# Patient Record
Sex: Female | Born: 1950 | Race: White | Hispanic: No
Health system: Southern US, Community
[De-identification: ages and names within clinical notes are randomized; demographics above are authoritative.]

## PROBLEM LIST (undated history)

## (undated) DIAGNOSIS — J45909 Unspecified asthma, uncomplicated: Secondary | ICD-10-CM

## (undated) DIAGNOSIS — M1990 Unspecified osteoarthritis, unspecified site: Secondary | ICD-10-CM

## (undated) DIAGNOSIS — N289 Disorder of kidney and ureter, unspecified: Secondary | ICD-10-CM

## (undated) DIAGNOSIS — F1190 Opioid use, unspecified, uncomplicated: Secondary | ICD-10-CM

## (undated) DIAGNOSIS — E669 Obesity, unspecified: Secondary | ICD-10-CM

## (undated) DIAGNOSIS — J449 Chronic obstructive pulmonary disease, unspecified: Secondary | ICD-10-CM

## (undated) DIAGNOSIS — I509 Heart failure, unspecified: Secondary | ICD-10-CM

## (undated) DIAGNOSIS — Z930 Tracheostomy status: Secondary | ICD-10-CM

## (undated) HISTORY — PX: OTHER SURGICAL HISTORY: SHX169

## (undated) HISTORY — PX: TRACHELECTOMY: SHX6586

## (undated) HISTORY — DX: Opioid use, unspecified, uncomplicated: F11.90

## (undated) HISTORY — DX: Obesity, unspecified: E66.9

## (undated) HISTORY — DX: Tracheostomy status: Z93.0

---

## 2018-04-30 ENCOUNTER — Telehealth: Payer: Self-pay | Admitting: Pulmonary Disease

## 2018-04-30 ENCOUNTER — Telehealth: Payer: Self-pay

## 2018-04-30 NOTE — Telephone Encounter (Signed)
Called and spoke with Patient.  Patient has appointment with Dr. Tonia BroomsIcard Monday, 05/03/18, at 0900.  She was calling to see if she had to be seen before she got a vent.  She said that she had used one in the past.  Explained that she would have to be seen,before Dr. Tonia BroomsIcard would prescribe anything.  She stated understanding.  Nothing further.

## 2018-04-30 NOTE — Telephone Encounter (Signed)
Called made to patient to obtain referring MD information as we have no records and patient is not in epic. Patient reports her previous Pulmonologist is Dr. Lonia Chimerayan Nahapetian [phone] with [facility] in [place], [place]. I did encourage patient to bring records and or images she has on hand to help assist with her consult visit. Patient voices understanding. Called and left message with clinic asking for records to be faxed. Nothing further needed at this time.

## 2018-05-03 ENCOUNTER — Encounter: Payer: Self-pay | Admitting: Pulmonary Disease

## 2018-05-03 ENCOUNTER — Ambulatory Visit (INDEPENDENT_AMBULATORY_CARE_PROVIDER_SITE_OTHER): Payer: Medicare Other | Admitting: Pulmonary Disease

## 2018-05-03 VITALS — BP 114/82 | HR 62 | Ht 60.0 in | Wt >= 6400 oz

## 2018-05-03 DIAGNOSIS — Z930 Tracheostomy status: Secondary | ICD-10-CM | POA: Diagnosis not present

## 2018-05-03 DIAGNOSIS — E662 Morbid (severe) obesity with alveolar hypoventilation: Secondary | ICD-10-CM

## 2018-05-03 DIAGNOSIS — J9611 Chronic respiratory failure with hypoxia: Secondary | ICD-10-CM | POA: Diagnosis not present

## 2018-05-03 NOTE — Patient Instructions (Addendum)
Thank you for visiting Dr. Tonia BroomsIcard at [facility]. Today we recommend the following:  Please set up with Sleep Consult.   You have an appointment Thursday December 19th at 2:00pm at [facility] at the [facility]. Stop at admitting and they will take you where you need to go. They will send you a reminder letter. If you need to reschedule call [phone].

## 2018-05-03 NOTE — Progress Notes (Signed)
Synopsis: Referred in November 2019, chronic respiratory failure requiring tracheostomy due to super morbid obesity and obesity hypoventilation syndrome by No ref. provider found  Subjective:   PATIENT ID: Rachael Robertson GENDER: female DOB: 1951/04/05, MRN: [id number]  Chief Complaint  Patient presents with  . Consult    Patient reports she moved here from [place] to be closer to family. Has been on oxygen for 2 years, uses 2L during the day and 5L at night with vent. Patient states she needs a new order for a vent.     PMH chronic respiratory, OHS, requiring trachestomy in 2017, [place], [facility].  Patient has a BMI of 88 and had multiple hospitalizations in [place] due to respiratory failure.  Patient has a history of heart failure and states that she would die several days in the hospital and they would give her medications to make her pee.  Approximately 2 years ago she required hospitalization and ultimately underwent tracheostomy tube placement.  Since then she has had multiple different changes of her tracheostomy.  She is currently in a 6 cuffless [place].  And has been tolerating well with Passy-Muir valve and trach collar for oxygen supplementation.  She was on a ventilator at nighttime in the past.  She used to follow with a sleep physician in [place].  The patient recently moved here to be closer to family.  She lives in a home by herself.  She is currently unable to make her own food but she does have family that is bringing food to her.  Additionally she states that she routinely orders food from [employer] and has it delivered to her house because she is unable to leave her home alone.  She is able to at least dress herself.  She would like to be set up with a new sleep doctor.     Past Medical History:  Diagnosis Date  . Obesity   . Opiate use   . Tracheostomy in place [facility])      Family History  Problem Relation Age of Onset  . Aneurysm Mother    . Heart disease Father   . Skin cancer Father   . Sleep apnea Father        TRACHEOSTOMY AS WELL  . Breast cancer Sister      Past Surgical History:  Procedure Laterality Date  . Perianal abscess    . TRACHELECTOMY      Social History   Socioeconomic History  . Marital status: Single    Spouse name: Not on file  . Number of children: Not on file  . Years of education: Not on file  . Highest education level: Not on file  Occupational History  . Not on file  Social Needs  . Financial resource strain: Not on file  . Food insecurity:    Worry: Not on file    Inability: Not on file  . Transportation needs:    Medical: Not on file    Non-medical: Not on file  Tobacco Use  . Smoking status: Passive Smoke Exposure - Never Smoker  . Smokeless tobacco: Never Used  Substance and Sexual Activity  . Alcohol use: Yes    Comment: 1-2 per year   . Drug use: Not on file  . Sexual activity: Not on file  Lifestyle  . Physical activity:    Days per week: Not on file    Minutes per session: Not on file  . Stress: Not on file  Relationships  . Social connections:    Talks on phone: Not on file    Gets together: Not on file    Attends religious service: Not on file    Active member of club or organization: Not on file    Attends meetings of clubs or organizations: Not on file    Relationship status: Not on file  . Intimate partner violence:    Fear of current or ex partner: Not on file    Emotionally abused: Not on file    Physically abused: Not on file    Forced sexual activity: Not on file  Other Topics Concern  . Not on file  Social History Narrative  . Not on file     Allergies  Allergen Reactions  . Erythromycin     Rash and Hives  . Naproxen     Makes her congested  . Sulfa Antibiotics     Hives     Outpatient Medications Prior to Visit  Medication Sig Dispense Refill  . acetaminophen (TYLENOL) 500 MG tablet Take 500 mg by mouth 2 (two) times daily as  needed.    Marland Kitchen. albuterol (PROVENTIL HFA;VENTOLIN HFA) 108 (90 Base) MCG/ACT inhaler Inhale 1-2 puffs into the lungs every 6 (six) hours as needed for wheezing or shortness of breath.    . allopurinol (ZYLOPRIM) 300 MG tablet Take 300 mg by mouth daily.    . budesonide-formoterol (SYMBICORT) 160-4.5 MCG/ACT inhaler Inhale 2 puffs into the lungs 2 (two) times daily.    . bumetanide (BUMEX) 2 MG tablet Take 4 mg by mouth daily. Takes 4mg  prn in afternoon    . carvedilol (COREG) 3.125 MG tablet Take 3.125 mg by mouth 2 (two) times daily as needed.    Marland Kitchen. levothyroxine (SYNTHROID, LEVOTHROID) 50 MCG tablet Take 50 mcg by mouth daily before breakfast.    . omeprazole (PRILOSEC) 20 MG capsule Take 20 mg by mouth daily.    Marland Kitchen. oxycodone (OXY-IR) 5 MG capsule Take 5 mg by mouth every 4 (four) hours as needed.    . potassium chloride SA (K-DUR,KLOR-CON) 20 MEQ tablet Take 40 mEq by mouth daily. Takes 40meq if prn dose of Bumex is taken.    . pramipexole (MIRAPEX) 1 MG tablet Take 1 mg by mouth 2 (two) times daily.    . pramipexole (MIRAPEX) 1 MG tablet Take 0.5 mg by mouth daily as needed.    . rivaroxaban (XARELTO) 10 MG TABS tablet Take 10 mg by mouth daily. Patient not sure what mg?    . spironolactone (ALDACTONE) 100 MG tablet Take 100 mg by mouth daily.     No facility-administered medications prior to visit.     Review of Systems  Constitutional: Negative for chills, fever, malaise/fatigue and weight loss.  HENT: Negative for hearing loss, sore throat and tinnitus.   Eyes: Negative for blurred vision and double vision.  Respiratory: Positive for shortness of breath. Negative for cough, hemoptysis, sputum production, wheezing and stridor.        Chronic shortness of breath, tracheostomy  Cardiovascular: Negative for chest pain, palpitations, orthopnea, leg swelling and PND.  Gastrointestinal: Negative for abdominal pain, constipation, diarrhea, heartburn, nausea and vomiting.  Genitourinary: Negative  for dysuria, hematuria and urgency.  Musculoskeletal: Negative for joint pain and myalgias.  Skin: Negative for itching and rash.  Neurological: Negative for dizziness, tingling, weakness and headaches.  Endo/Heme/Allergies: Negative for environmental allergies. Does not bruise/bleed easily.  Psychiatric/Behavioral: Negative for depression. The patient is not nervous/anxious  and does not have insomnia.   All other systems reviewed and are negative.    Objective:  Physical Exam  Constitutional: She is oriented to person, place, and time. No distress.  Obese   HENT:  Head: Normocephalic and atraumatic.  Mouth/Throat: Oropharynx is clear and moist.  Tracheostomy in place.  Tracheostomy site inspected, clean dry and intact.  Well-formed stoma  Eyes: Pupils are equal, round, and reactive to light. Conjunctivae are normal. No scleral icterus.  Neck: Neck supple. No JVD present. No tracheal deviation present.  Cardiovascular: Normal rate, regular rhythm, normal heart sounds and intact distal pulses.  No murmur heard. She does have a heart tones however it is very difficult to auscultate the chest due to her super morbid obesity.  Pulmonary/Chest: Breath sounds normal. No accessory muscle usage or stridor. No tachypnea. She has no wheezes. She has no rhonchi. She has no rales.  Shallow inspiratory effort Overall her pulmonary exam is very difficult due to her super morbid obesity.  Abdominal: Soft. Bowel sounds are normal. She exhibits no distension. There is no tenderness.  Morbidly obese tenderness  Musculoskeletal: She exhibits edema. She exhibits no tenderness.  Significant lymphedema/lipedema of the lower extremities  Lymphadenopathy:    She has no cervical adenopathy.  Neurological: She is alert and oriented to person, place, and time.  Skin: Skin is warm and dry. Capillary refill takes less than 2 seconds. No rash noted.  Psychiatric: She has a normal mood and affect. Her behavior  is normal.  Vitals reviewed.    Vitals:   05/03/18 0948 05/03/18 0949  BP:  114/82  Pulse:  62  SpO2:  100%  Weight: (!) 452 lb (205 kg) (!) 452 lb (205 kg)  Height: 5' (1.524 m) 5' (1.524 m)   100% on 4 L/min trach collar BMI Readings from Last 3 Encounters:  05/03/18 88.28 kg/m   Wt Readings from Last 3 Encounters:  05/03/18 (!) 452 lb (205 kg)    CBC No results found for: WBC, RBC, HGB, HCT, PLT, MCV, MCH, MCHC, RDW, LYMPHSABS, MONOABS, EOSABS, BASOSABS  Chest Imaging: No prior chest imaging for review  Pulmonary Functions Testing Results:None   FeNO: None   Pathology: None   Echocardiogram: None   Heart Catheterization: None     Assessment & Plan:   Chronic respiratory failure with hypoxia (HCC)  Tracheostomy dependent (HCC)  Obesity hypoventilation syndrome (HCC)  Obesity, morbid, BMI 50 or higher (HCC)  Discussion:  67 year old morbidly obese female status post tracheostomy likely secondary to obesity hypoventilation syndrome and BMI of 88.  She is currently wheelchair-bound most of the time.  I am worried that she is unable to care for herself adequately at home.  She does state that she has family that comes to check on her regularly as well as a caregiver that comes in every morning.  And currently she obtains food by ordering it from a delivery service.  She is here today to establish care in the [place] area as she recently moved from [place].  I will get her set up in our sleep clinic.  She would like to go back on a home ventilator at night if possible.  I will also get appointment to establish care with Anders Simmonds, NP in our tracheostomy care clinic.  I also had some discussions with her about exchanging her trach for a cuffed trach if she plan to go on a ventilator.  She did state that she had had several of  these in the past all of which had failed and she did not like having a cuffed trach in.  At this point, I counseled the patient on  weight loss.  This is an obvious large comorbidity in her life at this time.  She has been seen by bariatric surgery in the past which have all stated that he was too large to have surgery.  She can return to see me as needed however I suspect she needs close follow-up in the sleep clinic.  Greater than 50% of this patient's 45-minute visit was spent face-to-face discussing the above recommendations and treatment plan.   Current Outpatient Medications:  .  acetaminophen (TYLENOL) 500 MG tablet, Take 500 mg by mouth 2 (two) times daily as needed., Disp: , Rfl:  .  albuterol (PROVENTIL HFA;VENTOLIN HFA) 108 (90 Base) MCG/ACT inhaler, Inhale 1-2 puffs into the lungs every 6 (six) hours as needed for wheezing or shortness of breath., Disp: , Rfl:  .  allopurinol (ZYLOPRIM) 300 MG tablet, Take 300 mg by mouth daily., Disp: , Rfl:  .  budesonide-formoterol (SYMBICORT) 160-4.5 MCG/ACT inhaler, Inhale 2 puffs into the lungs 2 (two) times daily., Disp: , Rfl:  .  bumetanide (BUMEX) 2 MG tablet, Take 4 mg by mouth daily. Takes 4mg  prn in afternoon, Disp: , Rfl:  .  carvedilol (COREG) 3.125 MG tablet, Take 3.125 mg by mouth 2 (two) times daily as needed., Disp: , Rfl:  .  levothyroxine (SYNTHROID, LEVOTHROID) 50 MCG tablet, Take 50 mcg by mouth daily before breakfast., Disp: , Rfl:  .  omeprazole (PRILOSEC) 20 MG capsule, Take 20 mg by mouth daily., Disp: , Rfl:  .  oxycodone (OXY-IR) 5 MG capsule, Take 5 mg by mouth every 4 (four) hours as needed., Disp: , Rfl:  .  potassium chloride SA (K-DUR,KLOR-CON) 20 MEQ tablet, Take 40 mEq by mouth daily. Takes if prn dose of Bumex is taken., Disp: , Rfl:  .  pramipexole (MIRAPEX) 1 MG tablet, Take 1 mg by mouth 2 (two) times daily., Disp: , Rfl:  .  pramipexole (MIRAPEX) 1 MG tablet, Take 0.5 mg by mouth daily as needed., Disp: , Rfl:  .  rivaroxaban (XARELTO) 10 MG TABS tablet, Take 10 mg by mouth daily. Patient not sure what mg?, Disp: , Rfl:  .   spironolactone (ALDACTONE) 100 MG tablet, Take 100 mg by mouth daily., Disp: , Rfl:    Josephine Igo, DO [facility] 05/03/2018 3:38 PM

## 2018-05-04 ENCOUNTER — Telehealth: Payer: Self-pay | Admitting: Pulmonary Disease

## 2018-05-04 NOTE — Telephone Encounter (Signed)
Spoke with patient. She stated that she was seen by Dr. Tonia BroomsIcard on 05/03/18. She was advised to call back to give Dr. Tonia BroomsIcard the name of the pharmacy where the RX for oxycodone could be sent to. [facility] [address]. Advised patient that normally these RXs are printed out but I would send a message over to Dr. Tonia BroomsIcard for her.  Dr. Tonia BroomsIcard, please advise if you remember having a conversation with her about sending in a controlled substance. Thanks!

## 2018-05-04 NOTE — Telephone Encounter (Signed)
PCCM:  I will not be prescribing any opiates for chronic pain.   Josephine IgoBradley L Dmario Russom, DO [facility] 05/04/2018 5:05 PM

## 2018-05-05 NOTE — Telephone Encounter (Signed)
Called and spoke with patient, made aware BI would not be prescribing any opiates for chronic pain. Patient voices understanding and would like to see if BI would place order for referral to PCP.   Verbal order given per BI, okay to place order for referral to PCP. Order placed. Nothing further needed at this time.

## 2018-05-05 NOTE — Telephone Encounter (Signed)
Called patient unable to reach left message to give us a call back.

## 2018-05-05 NOTE — Telephone Encounter (Signed)
Patient returned phone call; pt contact # [phone]

## 2018-05-07 ENCOUNTER — Inpatient Hospital Stay (HOSPITAL_COMMUNITY)
Admission: EM | Admit: 2018-05-07 | Discharge: 2018-05-17 | DRG: 205 | Disposition: A | Discharge status: Home Health | Payer: Medicare Other | Attending: Internal Medicine | Admitting: Internal Medicine

## 2018-05-07 ENCOUNTER — Inpatient Hospital Stay (HOSPITAL_COMMUNITY): Payer: Medicare Other

## 2018-05-07 ENCOUNTER — Other Ambulatory Visit: Payer: Self-pay

## 2018-05-07 ENCOUNTER — Emergency Department (HOSPITAL_COMMUNITY): Payer: Medicare Other

## 2018-05-07 ENCOUNTER — Encounter (HOSPITAL_COMMUNITY): Payer: Self-pay | Admitting: Emergency Medicine

## 2018-05-07 DIAGNOSIS — Z930 Tracheostomy status: Secondary | ICD-10-CM | POA: Diagnosis not present

## 2018-05-07 DIAGNOSIS — Z79890 Hormone replacement therapy (postmenopausal): Secondary | ICD-10-CM | POA: Diagnosis not present

## 2018-05-07 DIAGNOSIS — N179 Acute kidney failure, unspecified: Secondary | ICD-10-CM | POA: Diagnosis not present

## 2018-05-07 DIAGNOSIS — I5042 Chronic combined systolic (congestive) and diastolic (congestive) heart failure: Secondary | ICD-10-CM | POA: Diagnosis present

## 2018-05-07 DIAGNOSIS — R0789 Other chest pain: Secondary | ICD-10-CM

## 2018-05-07 DIAGNOSIS — G4733 Obstructive sleep apnea (adult) (pediatric): Secondary | ICD-10-CM

## 2018-05-07 DIAGNOSIS — Y838 Other surgical procedures as the cause of abnormal reaction of the patient, or of later complication, without mention of misadventure at the time of the procedure: Secondary | ICD-10-CM | POA: Diagnosis present

## 2018-05-07 DIAGNOSIS — N189 Chronic kidney disease, unspecified: Secondary | ICD-10-CM | POA: Diagnosis not present

## 2018-05-07 DIAGNOSIS — Z886 Allergy status to analgesic agent status: Secondary | ICD-10-CM

## 2018-05-07 DIAGNOSIS — J9509 Other tracheostomy complication: Principal | ICD-10-CM | POA: Diagnosis present

## 2018-05-07 DIAGNOSIS — E662 Morbid (severe) obesity with alveolar hypoventilation: Secondary | ICD-10-CM | POA: Diagnosis present

## 2018-05-07 DIAGNOSIS — I509 Heart failure, unspecified: Secondary | ICD-10-CM | POA: Diagnosis not present

## 2018-05-07 DIAGNOSIS — Z881 Allergy status to other antibiotic agents status: Secondary | ICD-10-CM | POA: Diagnosis not present

## 2018-05-07 DIAGNOSIS — R001 Bradycardia, unspecified: Secondary | ICD-10-CM | POA: Diagnosis not present

## 2018-05-07 DIAGNOSIS — T502X5A Adverse effect of carbonic-anhydrase inhibitors, benzothiadiazides and other diuretics, initial encounter: Secondary | ICD-10-CM | POA: Diagnosis not present

## 2018-05-07 DIAGNOSIS — J9611 Chronic respiratory failure with hypoxia: Secondary | ICD-10-CM | POA: Diagnosis present

## 2018-05-07 DIAGNOSIS — E875 Hyperkalemia: Secondary | ICD-10-CM | POA: Diagnosis present

## 2018-05-07 DIAGNOSIS — I878 Other specified disorders of veins: Secondary | ICD-10-CM | POA: Diagnosis not present

## 2018-05-07 DIAGNOSIS — E039 Hypothyroidism, unspecified: Secondary | ICD-10-CM | POA: Diagnosis present

## 2018-05-07 DIAGNOSIS — Z8249 Family history of ischemic heart disease and other diseases of the circulatory system: Secondary | ICD-10-CM | POA: Diagnosis not present

## 2018-05-07 DIAGNOSIS — T45515A Adverse effect of anticoagulants, initial encounter: Secondary | ICD-10-CM | POA: Diagnosis not present

## 2018-05-07 DIAGNOSIS — J690 Pneumonitis due to inhalation of food and vomit: Secondary | ICD-10-CM | POA: Diagnosis present

## 2018-05-07 DIAGNOSIS — J9612 Chronic respiratory failure with hypercapnia: Secondary | ICD-10-CM | POA: Diagnosis present

## 2018-05-07 DIAGNOSIS — Z9981 Dependence on supplemental oxygen: Secondary | ICD-10-CM | POA: Diagnosis not present

## 2018-05-07 DIAGNOSIS — R042 Hemoptysis: Secondary | ICD-10-CM | POA: Diagnosis present

## 2018-05-07 DIAGNOSIS — R0902 Hypoxemia: Secondary | ICD-10-CM

## 2018-05-07 DIAGNOSIS — N183 Chronic kidney disease, stage 3 (moderate): Secondary | ICD-10-CM | POA: Diagnosis present

## 2018-05-07 DIAGNOSIS — Z79899 Other long term (current) drug therapy: Secondary | ICD-10-CM

## 2018-05-07 DIAGNOSIS — Z882 Allergy status to sulfonamides status: Secondary | ICD-10-CM

## 2018-05-07 DIAGNOSIS — I5020 Unspecified systolic (congestive) heart failure: Secondary | ICD-10-CM | POA: Diagnosis not present

## 2018-05-07 DIAGNOSIS — J449 Chronic obstructive pulmonary disease, unspecified: Secondary | ICD-10-CM | POA: Diagnosis present

## 2018-05-07 DIAGNOSIS — Z6841 Body Mass Index (BMI) 40.0 and over, adult: Secondary | ICD-10-CM

## 2018-05-07 DIAGNOSIS — R5383 Other fatigue: Secondary | ICD-10-CM | POA: Diagnosis not present

## 2018-05-07 DIAGNOSIS — Z7722 Contact with and (suspected) exposure to environmental tobacco smoke (acute) (chronic): Secondary | ICD-10-CM | POA: Diagnosis present

## 2018-05-07 DIAGNOSIS — Z9911 Dependence on respirator [ventilator] status: Secondary | ICD-10-CM | POA: Diagnosis not present

## 2018-05-07 DIAGNOSIS — J181 Lobar pneumonia, unspecified organism: Secondary | ICD-10-CM

## 2018-05-07 DIAGNOSIS — J189 Pneumonia, unspecified organism: Secondary | ICD-10-CM

## 2018-05-07 DIAGNOSIS — I130 Hypertensive heart and chronic kidney disease with heart failure and stage 1 through stage 4 chronic kidney disease, or unspecified chronic kidney disease: Secondary | ICD-10-CM | POA: Diagnosis not present

## 2018-05-07 DIAGNOSIS — Z86711 Personal history of pulmonary embolism: Secondary | ICD-10-CM

## 2018-05-07 DIAGNOSIS — D6832 Hemorrhagic disorder due to extrinsic circulating anticoagulants: Secondary | ICD-10-CM | POA: Diagnosis not present

## 2018-05-07 DIAGNOSIS — Z7901 Long term (current) use of anticoagulants: Secondary | ICD-10-CM | POA: Diagnosis not present

## 2018-05-07 DIAGNOSIS — R233 Spontaneous ecchymoses: Secondary | ICD-10-CM | POA: Diagnosis not present

## 2018-05-07 LAB — RESPIRATORY PANEL BY PCR
ADENOVIRUS-RVPPCR: NOT DETECTED
Bordetella pertussis: NOT DETECTED
CORONAVIRUS 229E-RVPPCR: NOT DETECTED
CORONAVIRUS NL63-RVPPCR: NOT DETECTED
Chlamydophila pneumoniae: NOT DETECTED
Coronavirus HKU1: NOT DETECTED
Coronavirus OC43: NOT DETECTED
Influenza A: NOT DETECTED
Influenza B: NOT DETECTED
Metapneumovirus: NOT DETECTED
Mycoplasma pneumoniae: NOT DETECTED
Parainfluenza Virus 1: NOT DETECTED
Parainfluenza Virus 2: NOT DETECTED
Parainfluenza Virus 3: NOT DETECTED
Parainfluenza Virus 4: NOT DETECTED
Respiratory Syncytial Virus: NOT DETECTED
Rhinovirus / Enterovirus: NOT DETECTED

## 2018-05-07 LAB — SAMPLE TO BLOOD BANK

## 2018-05-07 LAB — CBC WITH DIFFERENTIAL/PLATELET
ABS IMMATURE GRANULOCYTES: 0.13 10*3/uL — AB (ref 0.00–0.07)
BASOS PCT: 0 %
Basophils Absolute: 0 10*3/uL (ref 0.0–0.1)
Eosinophils Absolute: 0.2 10*3/uL (ref 0.0–0.5)
Eosinophils Relative: 3 %
HCT: 32.7 % — ABNORMAL LOW (ref 36.0–46.0)
Hemoglobin: 9.1 g/dL — ABNORMAL LOW (ref 12.0–15.0)
Immature Granulocytes: 2 %
Lymphocytes Relative: 10 %
Lymphs Abs: 0.6 10*3/uL — ABNORMAL LOW (ref 0.7–4.0)
MCH: 26.3 pg (ref 26.0–34.0)
MCHC: 27.8 g/dL — ABNORMAL LOW (ref 30.0–36.0)
MCV: 94.5 fL (ref 80.0–100.0)
Monocytes Absolute: 0.4 10*3/uL (ref 0.1–1.0)
Monocytes Relative: 7 %
NRBC: 0.3 % — AB (ref 0.0–0.2)
Neutro Abs: 4.5 10*3/uL (ref 1.7–7.7)
Neutrophils Relative %: 78 %
Platelets: 136 10*3/uL — ABNORMAL LOW (ref 150–400)
RBC: 3.46 MIL/uL — ABNORMAL LOW (ref 3.87–5.11)
RDW: 15 % (ref 11.5–15.5)
WBC: 5.7 10*3/uL (ref 4.0–10.5)

## 2018-05-07 LAB — I-STAT TROPONIN, ED
Troponin i, poc: 0 ng/mL (ref 0.00–0.08)
Troponin i, poc: 0.01 ng/mL (ref 0.00–0.08)

## 2018-05-07 LAB — BASIC METABOLIC PANEL
Anion gap: 6 (ref 5–15)
BUN: 59 mg/dL — ABNORMAL HIGH (ref 8–23)
CO2: 42 mmol/L — ABNORMAL HIGH (ref 22–32)
CREATININE: 1.34 mg/dL — AB (ref 0.44–1.00)
Calcium: 9.3 mg/dL (ref 8.9–10.3)
Chloride: 95 mmol/L — ABNORMAL LOW (ref 98–111)
GFR calc non Af Amer: 41 mL/min — ABNORMAL LOW (ref >60)
GFR, EST AFRICAN AMERICAN: 48 mL/min — AB (ref >60)
Glucose, Bld: 91 mg/dL (ref 70–99)
Potassium: 4.5 mmol/L (ref 3.5–5.1)
Sodium: 143 mmol/L (ref 135–145)

## 2018-05-07 LAB — TSH: TSH: 1.897 u[IU]/mL (ref 0.350–4.500)

## 2018-05-07 LAB — BRAIN NATRIURETIC PEPTIDE: B Natriuretic Peptide: 252.7 pg/mL — ABNORMAL HIGH (ref 0.0–100.0)

## 2018-05-07 LAB — PROTIME-INR
INR: 1.04
Prothrombin Time: 13.5 s (ref 11.4–15.2)

## 2018-05-07 LAB — ECHOCARDIOGRAM COMPLETE
Height: 60 in
Weight: 7232.01 oz

## 2018-05-07 MED ORDER — ACETAMINOPHEN 325 MG PO TABS
650.0000 mg | ORAL_TABLET | Freq: Four times a day (QID) | ORAL | Status: DC | PRN
  Administered 2018-05-09 – 2018-05-16 (×15): 650 mg via ORAL
  Filled 2018-05-07 (×16): qty 2

## 2018-05-07 MED ORDER — IPRATROPIUM-ALBUTEROL 0.5-2.5 (3) MG/3ML IN SOLN
3.0000 mL | Freq: Four times a day (QID) | RESPIRATORY_TRACT | Status: DC | PRN
  Administered 2018-05-07 – 2018-05-13 (×3): 3 mL via RESPIRATORY_TRACT
  Filled 2018-05-07 (×4): qty 3

## 2018-05-07 MED ORDER — METHYLPREDNISOLONE SODIUM SUCC 125 MG IJ SOLR
125.0000 mg | Freq: Once | INTRAMUSCULAR | Status: AC
  Administered 2018-05-07: 125 mg via INTRAVENOUS
  Filled 2018-05-07: qty 2

## 2018-05-07 MED ORDER — ALLOPURINOL 300 MG PO TABS
300.0000 mg | ORAL_TABLET | Freq: Every day | ORAL | Status: DC
  Administered 2018-05-07 – 2018-05-17 (×11): 300 mg via ORAL
  Filled 2018-05-07 (×11): qty 1

## 2018-05-07 MED ORDER — BUMETANIDE 2 MG PO TABS
2.0000 mg | ORAL_TABLET | Freq: Every day | ORAL | Status: DC
  Administered 2018-05-08 – 2018-05-12 (×5): 2 mg via ORAL
  Filled 2018-05-07 (×6): qty 1

## 2018-05-07 MED ORDER — RIVAROXABAN 20 MG PO TABS
20.0000 mg | ORAL_TABLET | Freq: Every day | ORAL | Status: DC
  Administered 2018-05-07 – 2018-05-16 (×9): 20 mg via ORAL
  Filled 2018-05-07 (×10): qty 1

## 2018-05-07 MED ORDER — MOMETASONE FURO-FORMOTEROL FUM 200-5 MCG/ACT IN AERO
2.0000 | INHALATION_SPRAY | Freq: Two times a day (BID) | RESPIRATORY_TRACT | Status: DC
  Administered 2018-05-07 – 2018-05-17 (×19): 2 via RESPIRATORY_TRACT
  Filled 2018-05-07: qty 8.8

## 2018-05-07 MED ORDER — IPRATROPIUM BROMIDE 0.02 % IN SOLN
0.5000 mg | Freq: Once | RESPIRATORY_TRACT | Status: AC
  Administered 2018-05-07: 0.5 mg via RESPIRATORY_TRACT
  Filled 2018-05-07: qty 2.5

## 2018-05-07 MED ORDER — PANTOPRAZOLE SODIUM 40 MG PO TBEC
40.0000 mg | DELAYED_RELEASE_TABLET | Freq: Every day | ORAL | Status: DC
  Administered 2018-05-07 – 2018-05-17 (×11): 40 mg via ORAL
  Filled 2018-05-07 (×11): qty 1

## 2018-05-07 MED ORDER — PIPERACILLIN-TAZOBACTAM 3.375 G IVPB
3.3750 g | Freq: Three times a day (TID) | INTRAVENOUS | Status: DC
  Administered 2018-05-07 – 2018-05-08 (×3): 3.375 g via INTRAVENOUS
  Filled 2018-05-07 (×4): qty 50

## 2018-05-07 MED ORDER — PERFLUTREN LIPID MICROSPHERE
1.0000 mL | INTRAVENOUS | Status: AC | PRN
  Administered 2018-05-07: 3 mL via INTRAVENOUS
  Filled 2018-05-07: qty 10

## 2018-05-07 MED ORDER — LEVOFLOXACIN IN D5W 750 MG/150ML IV SOLN
750.0000 mg | Freq: Once | INTRAVENOUS | Status: AC
  Administered 2018-05-07: 750 mg via INTRAVENOUS
  Filled 2018-05-07: qty 150

## 2018-05-07 MED ORDER — IOPAMIDOL (ISOVUE-370) INJECTION 76%
100.0000 mL | Freq: Once | INTRAVENOUS | Status: AC | PRN
  Administered 2018-05-07: 100 mL via INTRAVENOUS

## 2018-05-07 MED ORDER — SPIRONOLACTONE 100 MG PO TABS
100.0000 mg | ORAL_TABLET | Freq: Every day | ORAL | Status: DC
  Administered 2018-05-07 – 2018-05-11 (×5): 100 mg via ORAL
  Filled 2018-05-07 (×2): qty 1
  Filled 2018-05-07: qty 4
  Filled 2018-05-07: qty 1
  Filled 2018-05-07 (×2): qty 4
  Filled 2018-05-07: qty 1
  Filled 2018-05-07: qty 4
  Filled 2018-05-07: qty 1
  Filled 2018-05-07: qty 4
  Filled 2018-05-07: qty 1
  Filled 2018-05-07: qty 4

## 2018-05-07 MED ORDER — IOPAMIDOL (ISOVUE-370) INJECTION 76%
INTRAVENOUS | Status: AC
  Filled 2018-05-07: qty 100

## 2018-05-07 MED ORDER — ALBUTEROL SULFATE (2.5 MG/3ML) 0.083% IN NEBU
5.0000 mg | INHALATION_SOLUTION | Freq: Once | RESPIRATORY_TRACT | Status: AC
  Administered 2018-05-07: 5 mg via RESPIRATORY_TRACT
  Filled 2018-05-07: qty 6

## 2018-05-07 MED ORDER — PRAMIPEXOLE DIHYDROCHLORIDE 1 MG PO TABS
1.0000 mg | ORAL_TABLET | Freq: Two times a day (BID) | ORAL | Status: DC
  Administered 2018-05-07 – 2018-05-11 (×9): 1 mg via ORAL
  Filled 2018-05-07 (×9): qty 1

## 2018-05-07 MED ORDER — SODIUM CHLORIDE 0.9 % IV BOLUS (SEPSIS)
500.0000 mL | Freq: Once | INTRAVENOUS | Status: AC
  Administered 2018-05-07: 500 mL via INTRAVENOUS

## 2018-05-07 MED ORDER — GUAIFENESIN ER 600 MG PO TB12
600.0000 mg | ORAL_TABLET | Freq: Two times a day (BID) | ORAL | Status: DC | PRN
  Administered 2018-05-08 – 2018-05-16 (×5): 600 mg via ORAL
  Filled 2018-05-07 (×5): qty 1

## 2018-05-07 MED ORDER — CARVEDILOL 3.125 MG PO TABS
3.1250 mg | ORAL_TABLET | Freq: Two times a day (BID) | ORAL | Status: DC
Start: 2018-05-07 — End: 2018-05-16
  Administered 2018-05-07 – 2018-05-15 (×9): 3.125 mg via ORAL
  Filled 2018-05-07 (×16): qty 1

## 2018-05-07 MED ORDER — ACETAMINOPHEN 650 MG RE SUPP: 650.0000 mg | Freq: Four times a day (QID) | RECTAL | Status: DC | PRN

## 2018-05-07 MED ORDER — SODIUM CHLORIDE 0.9% FLUSH
3.0000 mL | Freq: Two times a day (BID) | INTRAVENOUS | Status: DC
  Administered 2018-05-07 – 2018-05-17 (×20): 3 mL via INTRAVENOUS

## 2018-05-07 MED ORDER — LEVOTHYROXINE SODIUM 50 MCG PO TABS
50.0000 ug | ORAL_TABLET | Freq: Every day | ORAL | Status: DC
  Administered 2018-05-08 – 2018-05-17 (×10): 50 ug via ORAL
  Filled 2018-05-07 (×11): qty 1

## 2018-05-07 NOTE — Discharge Summary (Addendum)
Name: Rachael Robertson MRN: [id number] DOB: 05/30/1951 67 y.o. PCP: Patient, No Pcp Per  Date of Admission: 05/07/2018  3:54 AM Date of Discharge: 05/17/2018 Attending Physician: Inez Catalina, MD  Discharge Diagnosis: 1. Hemoptysis in setting of NOAC use 2. Chronic Respiratory Failure requiring tracheostomy 3. Morbid Obesity 4. Obesity Hypoventilation Syndrome 5. Chronic HFrEF  Discharge Medications: Allergies as of 05/17/2018      Reactions   Erythromycin    Rash and Hives   Naproxen    Makes her congested   Sulfa Antibiotics    Hives      Medication List    STOP taking these medications   oxycodone 5 MG capsule Commonly known as:  OXY-IR     TAKE these medications   acetaminophen 500 MG tablet Commonly known as:  TYLENOL Take 500 mg by mouth 2 (two) times daily as needed for mild pain.   albuterol 108 (90 Base) MCG/ACT inhaler Commonly known as:  PROVENTIL HFA;VENTOLIN HFA Inhale 1-2 puffs into the lungs every 6 (six) hours as needed for wheezing or shortness of breath.   allopurinol 300 MG tablet Commonly known as:  ZYLOPRIM Take 300 mg by mouth daily.   budesonide-formoterol 160-4.5 MCG/ACT inhaler Commonly known as:  SYMBICORT Inhale 2 puffs into the lungs 2 (two) times daily.   bumetanide 2 MG tablet Commonly known as:  BUMEX Take 2 mg by mouth daily.   carvedilol 3.125 MG tablet Commonly known as:  COREG Take 3.125 mg by mouth 2 (two) times daily with a meal.   cetirizine 10 MG tablet Commonly known as:  ZYRTEC Take 10 mg by mouth daily.   levothyroxine 50 MCG tablet Commonly known as:  SYNTHROID, LEVOTHROID Take 50 mcg by mouth daily before breakfast.   omeprazole 20 MG capsule Commonly known as:  PRILOSEC Take 20 mg by mouth daily.   potassium chloride SA 20 MEQ tablet Commonly known as:  K-DUR,KLOR-CON Take 40 mEq by mouth daily.   pramipexole 1 MG tablet Commonly known as:  MIRAPEX Take 1 mg by mouth 2 (two) times daily.     rivaroxaban 20 MG Tabs tablet Commonly known as:  XARELTO Take 1 tablet (20 mg total) by mouth daily with supper. What changed:    medication strength  how much to take  when to take this   silver sulfADIAZINE 1 % cream Commonly known as:  SILVADENE Apply topically daily.   spironolactone 100 MG tablet Commonly known as:  ALDACTONE Take 100 mg by mouth daily.            Durable Medical Equipment  (From admission, onward)         Start     Ordered   05/17/18 1010  For home use only DME oxygen  Once    Question Answer Comment  Mode or (Route) Mask   Liters per Minute 4   Frequency Continuous (stationary and portable oxygen unit needed)   Oxygen conserving device Yes   Oxygen delivery system Gas      05/17/18 1010   05/17/18 0857  For home use only DME Other see comment  Once    Comments:  Purewick at home. She wants paper script.   05/17/18 0856   05/10/18 1304  For home use only DME Ventilator  Once    Comments:  Trilogy ventilator per pulmonary note 11/30 Vent settings: TV goal of 450cc, Back up rate of 10/min   05/10/18 1303  Disposition and follow-up:   Rachael Robertson was [facility] in Stable condition.  At the hospital follow up visit please address:  1.  Hemoptysis - Present w/ blood clots from trach - On xarelto - Evaluated by PCCM - Hemoptysis self-resolved - Please check for further hemoptysis and cbc for anemia  2. Chronic respiratory failure - Found to be retaining CO2 and somnolent - Improved with ventilator use - Discharged with home ventilator set up and home nursing - Please ensure she is adherent to home ventilator use and has adequate trach care  3. HFrEF:  - Admit w/ respiratory distress - Had episode of AKI and Hyperkalemia during hospitalization - Was discharged on home dose of spironolactone and bumetanide - Please check BMP to ensure her electrolytes are stable  4. Morbid  Obesity - Has severe OHS due to body habitus and significantly reduced mobility - Poor candidate for bariatric surgery - Consider referral for behavioral weight loss program  4.  Labs / imaging needed at time of follow-up: cbc, bmp  5.  Pending labs/ test needing follow-up: none  Follow-up Appointments: Follow-up Information    [facility] Follow up.        [facility] Follow up on 07/08/2018.   Specialty:  Respiratory Therapy Why:  12 noon w/ Anders SimmondsPete Babcock ACNP-BC Contact information: [address] [id number] [address] [id number] [phone]       Coral CeoMack, Brian P, NP Follow up on 05/28/2018.   Specialty:  Pulmonary Disease Why:  9 am appointment Please be there at 08:45 Contact information: [address] [place] [id number] [phone]        [facility]. Call.   Contact information: [address] [place] [id number] [phone]         Hospital Course by problem list: 1. Hemoptysis: Presented with blood clots from her trach. Found to have moved recently from [place] with [place] one to manage [place]. Thought to be multifactorial due to Xarelto use for hx of PE and possible Pneumonia based on CT findings. Received 4 day course of Unasyn. Hemoptysis resolved with frequent suctioning. Discharged w/ follow up with sleep medicine for trach care.  2. Chronic hypercarbic respiratory failure 2/2 OHS: Was using ventilator and oxygen at home. Found out she was renting equipment in [place] and had to return them prior to moving to [place]. Put on O2 through trach during hospital stay. No desaturation event during hospitalization. Found to be somnolent in mornings with hypercarbia. Home ventilator set up in hospital for training. Mental status improved with ventilator use. Discharged with home oxygen and ventilator set up.  3. HFrEF: TTE performed  during workup for soft blood pressures confirmed known HFrEF w/ LVEF of 45-50%. Had episode of AKI and Hyperkalemia during admission. Home spironolactone and bumetanide held for 2 days. AKI and hyperkalemia resolved. Discharged back on home diuretics.   Discharge Vitals:   BP (!) 102/46 (BP Location: Right Wrist)   Pulse (!) 58   Temp 98.1 F (36.7 C) (Oral)   Resp 16   Ht 5' (1.524 m)   Wt (!) 206.4 kg   SpO2 98%   BMI 88.86 kg/m   Pertinent Labs, Studies, and Procedures:   BMP Latest Ref Rng & Units 05/16/2018 05/15/2018 05/14/2018  Glucose 70 - 99 mg/dL 536(U113(H) 440(H107(H) 474(Q117(H)  BUN 8 - 23 mg/dL 59(D25(H) 23 63(O28(H)  Creatinine  0.44 - 1.00 mg/dL 0.98(J) 1.91(Y) 7.82(N)  Sodium 135 - 145 mmol/L 140 140 142  Potassium 3.5 - 5.1 mmol/L 4.4 3.9 4.0  Chloride 98 - 111 mmol/L 90(L) 90(L) 92(L)  CO2 22 - 32 mmol/L 38(H) 41(H) 42(H)  Calcium 8.9 - 10.3 mg/dL 5.6(O) 1.3(Y) 8.3(L)     Chest XR 11/28 IMPRESSION: 1. Tracheostomy tube at the thoracic inlet. 2. Cardiomegaly with scattered atelectasis.   Electronically Signed   By: Narda Rutherford M.D.   On: 05/07/2018 04:42  CT Angio 05/07/2018 IMPRESSION: 1. The study is nondiagnostic for the evaluation of pulmonary emboli due to patient body habitus. 2. Mild atherosclerotic changes at the aorta. 3. Right greater than left posterior lower lobe airspace disease concerning for pneumonia or aspiration. 4. Diffuse ground-glass attenuation likely reflects mild edema.  Electronically Signed   By: Marin Roberts M.D.   On: 05/07/2018 07:01  Echo 11/30  Study Conclusions  - Left ventricle: The cavity size was normal. Wall thickness was   increased in a pattern of mild LVH. Systolic function was mildly   reduced. The estimated ejection fraction was in the range of 45%   to 50%. Diffuse hypokinesis. Doppler parameters are consistent   with abnormal left ventricular relaxation (grade 1 diastolic   dysfunction). The Ee&' ratio is  >20, suggesting elevated LV   filling pressure. - Left atrium: The atrium was normal in size. - Right atrium: The atrium was mildly dilated. - Inferior vena cava: The vessel was dilated. The respirophasic   diameter changes were blunted (< 50%), consistent with elevated   central venous pressure.  Impressions:  - LVEF 45-50%, mild LVH, global hypokinesis, grade 1 DD, elevated   LV filling pressure, normal LA size, mild LAE, dilated IVC.  Discharge Instructions: Discharge Instructions    Diet - low sodium heart healthy   Complete by:  As directed    Face-to-face encounter (required for Medicare/Medicaid patients)   Complete by:  As directed    I Theotis Barrio certify that this patient is under my care and that I, or a nurse practitioner or physician's assistant working with me, had a face-to-face encounter that meets the physician face-to-face encounter requirements with this patient on 05/17/2018. 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   The encounter with the patient was in whole, or in part, for the following medical condition, which is the primary reason for home health care:  Obesity Hypoventilation syndrome with chronic tracheosotmy tube   I certify that, based on my findings, the following services are medically necessary home health services:  Nursing   Reason for Medically Necessary Home Health Services:  Skilled Nursing- Skilled Assessment/Observation   My clinical findings support the need for the above services:  Can transfer bed to chair only   Further, I certify that my clinical findings support that this patient is homebound due to:  Unsafe ambulation due to balance issues   Home Health   Complete by:  As directed    To provide the following  care/treatments:  RN   Increase activity slowly   Complete by:  As directed      Signed: Theotis Barrio, MD 05/17/2018, 11:25 AM   Pager: [phone]

## 2018-05-07 NOTE — Progress Notes (Signed)
  Echocardiogram 2D Echocardiogram with definity has been performed.  Leta JunglingCooper, Ulyess Muto M 05/07/2018, 2:15 PM

## 2018-05-07 NOTE — Progress Notes (Signed)
Patient asked to be lavaged and suctioned. Thick bloody plugs obtained via tracheal suction. Patient tolerated well. RT will continue to monitor.

## 2018-05-07 NOTE — Progress Notes (Signed)
Pharmacy Antibiotic Note  Rachael Robertson is a 67 y.o. female admitted on 05/07/2018 with concern for VAP, on trach.  Pharmacy has been consulted for zosyn dosing.  SCr 1.34, CrCl 72 mL/min.  Plan: Zosyn 3.375g IV every 8 hours (4h infusion) F/u Cx, LOT and ability to narrow  Height: 5' (152.4 cm) Weight: (!) 452 lb (205 kg) IBW/kg (Calculated) : 45.5  Temp (24hrs), Avg:98.1 F (36.7 C), Min:98.1 F (36.7 C), Max:98.1 F (36.7 C)  Recent Labs  Lab 05/07/18 0500  WBC 5.7  CREATININE 1.34*    Estimated Creatinine Clearance: 72.2 mL/min (A) (by C-G formula based on SCr of 1.34 mg/dL (H)).    Allergies  Allergen Reactions  . Erythromycin     Rash and Hives  . Naproxen     Makes her congested  . Sulfa Antibiotics     Hives    Antimicrobials this admission: Zosyn 11/29>>  Dose adjustments this admission: n/a  Microbiology results: 11/29 BCx: sent Resp PCR: sent  Rachael Robertson, PharmD Clinical Pharmacist Please check AMION for all [facility] numbers 05/07/2018 9:58 AM

## 2018-05-07 NOTE — Plan of Care (Signed)
Nutrition Education Note  RD consulted for nutrition education regarding weight loss and diet options.  Discussed pt's typical daily intake. Pt states that she has an aid that is with her in the morning and in the evening who cooks for her. Pt states that she follows a special diet to help "get this fluid weight off." Pt states that she is lactose intolerant and does not tolerate soy.  Breakfast: salad with cucumbers, cheese, tomatoes, carrots, and soy- and lactose-free [facility] dressing, "hot drink" made with apple cider vinegar, lemon juice, honey, and water Snack/Lunch: bechamel sauce (2 cups) and toast dipped in it, salt-free crackers with cheese Dinner: pasta dish made with butter and ketchup  Pt states that she mainly drinks water throughout the day. Pt also reports having a "finnicky" GI system and that she likes to stay "a little constipated" to avoid diarrhea.  RD provided "Heart Healthy Nutrition Therapy" handout from the Academy of [employer] and Dietetics. Reviewed patient's dietary recall. Provided examples on ways to decrease sodium and saturated fat intake in diet. Discouraged intake of processed foods and use of salt shaker. Encouraged fresh fruits and vegetables as well as whole grain sources of carbohydrates to maximize fiber intake. Teach back method used.  Expect poor to fair compliance.  Body mass index is 88.28 kg/m. Pt meets criteria for morbid obesity based on current BMI.  Current diet order is Heart Healthy. No meal completion charted at this time. Labs and medications reviewed. No further nutrition interventions warranted at this time. RD contact information provided. If additional nutrition issues arise, please re-consult RD.   Earma ReadingKate Jablonski Waverly Tarquinio, MS, RD, LDN Inpatient Clinical Dietitian Pager: [phone] Weekend/After Hours: [phone]

## 2018-05-07 NOTE — ED Triage Notes (Addendum)
Pt from home after recently moving here from [place]. Chronic trach pt.  oxygen dependent. Pt states she has had 4 episodes of "clots" in her trach since 6 am yesterday and wants to be checked out. Pt is on xarelto

## 2018-05-07 NOTE — ED Provider Notes (Addendum)
TIME SEEN: 4:19 AM  CHIEF COMPLAINT: Blood clots coming from trach  HPI: Patient is a 67 year old female with history of morbid obesity, tracheostomy, PEs on Xarelto, chronic kidney disease, COPD, CHF who presents to the emergency department with chest discomfort in the center of her chest, shortness of breath for the past day and suctioning small blood clots from her trach.  States she just moved here from [place] 1 week ago.  She lives at home with her family.  She came here by airplane.  Just prior to leaving [place] she had her trach replaced.  She has a 6.0 XLT distal Sh[place].  She has had some cough.  No known fever.  Patient is morbidly obese.  States her last weight was 452 pounds.  ROS: See HPI Constitutional: no fever  Eyes: no drainage  ENT: no runny nose   Cardiovascular:  chest pain  Resp:  SOB  GI: no vomiting GU: no dysuria Integumentary: no rash  Allergy: no hives  Musculoskeletal: no leg swelling  Neurological: no slurred speech ROS otherwise negative  PAST MEDICAL HISTORY/PAST SURGICAL HISTORY:  Past Medical History:  Diagnosis Date  . Obesity   . Opiate use   . Tracheostomy in place [facility](HCC)     MEDICATIONS:  Prior to Admission medications   Medication Sig Start Date End Date Taking? Authorizing Provider  acetaminophen (TYLENOL) 500 MG tablet Take 500 mg by mouth 2 (two) times daily as needed.    [provider]  albuterol (PROVENTIL HFA;VENTOLIN HFA) 108 (90 Base) MCG/ACT inhaler Inhale 1-2 puffs into the lungs every 6 (six) hours as needed for wheezing or shortness of breath.    [provider]  allopurinol (ZYLOPRIM) 300 MG tablet Take 300 mg by mouth daily.    [provider]  budesonide-formoterol (SYMBICORT) 160-4.5 MCG/ACT inhaler Inhale 2 puffs into the lungs 2 (two) times daily.    [provider]  bumetanide (BUMEX) 2 MG tablet Take 4 mg by mouth daily. Takes 4mg  prn in afternoon    [provider]  carvedilol  (COREG) 3.125 MG tablet Take 3.125 mg by mouth 2 (two) times daily as needed.    [provider]  levothyroxine (SYNTHROID, LEVOTHROID) 50 MCG tablet Take 50 mcg by mouth daily before breakfast.    [provider]  omeprazole (PRILOSEC) 20 MG capsule Take 20 mg by mouth daily.    [provider]  oxycodone (OXY-IR) 5 MG capsule Take 5 mg by mouth every 4 (four) hours as needed.    [provider]  potassium chloride SA (K-DUR,KLOR-CON) 20 MEQ tablet Take 40 mEq by mouth daily. Takes 40meq if prn dose of Bumex is taken.    [provider]  pramipexole (MIRAPEX) 1 MG tablet Take 1 mg by mouth 2 (two) times daily.    [provider]  pramipexole (MIRAPEX) 1 MG tablet Take 0.5 mg by mouth daily as needed.    [provider]  rivaroxaban (XARELTO) 10 MG TABS tablet Take 10 mg by mouth daily. Patient not sure what mg?    [provider]  spironolactone (ALDACTONE) 100 MG tablet Take 100 mg by mouth daily.    [provider]    ALLERGIES:  Allergies  Allergen Reactions  . Erythromycin     Rash and Hives  . Naproxen     Makes her congested  . Sulfa Antibiotics     Hives    SOCIAL HISTORY:  Social History   Tobacco Use  .  Smoking status: Passive Smoke Exposure - Never Smoker  . Smokeless tobacco: Never Used  Substance Use Topics  . Alcohol use: Yes    Comment: 1-2 per year     FAMILY HISTORY: Family History  Problem Relation Age of Onset  . Aneurysm Mother   . Heart disease Father   . Skin cancer Father   . Sleep apnea Father        TRACHEOSTOMY AS WELL  . Breast cancer Sister     EXAM: BP 118/81   Pulse 72   Temp 98.1 F (36.7 C) (Oral)   Resp 17   SpO2 94%  CONSTITUTIONAL: Alert and oriented and responds appropriately to questions.  Morbidly obese, chronically ill-appearing HEAD: Normocephalic EYES: Conjunctivae clear, pupils appear equal, EOMI ENT: normal nose; moist mucous  membranes NECK: Supple, no meningismus, no nuchal rigidity, no LAD  CARD: RRR; S1 and S2 appreciated; no murmurs, no clicks, no rubs, no gallops RESP: Trach in place without any active bleeding.  Patient has diffuse expiratory wheezes on examination.  No rhonchi or rales.  Lung examination is difficult secondary to morbid obesity. ABD/GI: Normal bowel sounds; non-distended; soft, non-tender, no rebound, no guarding, no peritoneal signs, no hepatosplenomegaly BACK:  The back appears normal and is non-tender to palpation, there is no CVA tenderness EXT: Normal ROM in all joints; non-tender to palpation; no edema; normal capillary refill; no cyanosis, no calf tenderness or swelling    SKIN: Normal color for age and race; warm; no rash NEURO: Moves all extremities equally PSYCH: The patient's mood and manner are appropriate. Grooming and personal hygiene are appropriate.  MEDICAL DECISION MAKING: Patient here with chest pain, shortness of breath and suctioning blood clots from her trach today.  She is not actively bleeding from her trach.  No respiratory distress.  Will obtain labs, chest x-ray, CT of her chest.  EKG shows right bundle branch block.  Will give albuterol, Atrovent IV steroids for symptomatic relief.  ED PROGRESS: Troponin is negative.  No leukocytosis.  Patient is anemic.  Creatinine is 1.34.  Chest x-ray shows no acute abnormality.  CT pending.   CT shows right greater than left posterior lower lobe airspace disease concerning for pneumonia.  Will treat with IV Levaquin and obtain blood cultures.  Have recommended admission given patient has multiple chronic medical issues including tracheostomy on chronic oxygen with no outpatient PCP for close follow-up.  Unable to detect pulmonary embolus given patient's body habitus.  7:28 AM Discussed patient's case with IM resident.  I have recommended admission and patient (and family if present) agree with this plan. Admitting physician will  place admission orders.   I reviewed all nursing notes, vitals, pertinent previous records, EKGs, lab and urine results, imaging (as available).    EKG Interpretation  Date/Time:  Friday May 07 2018 04:12:24 EST Ventricular Rate:  68 PR Interval:    QRS Duration: 176 QT Interval:  479 QTC Calculation: 510 R Axis:   -71 Text Interpretation:  Sinus rhythm Multiform ventricular premature complexes Right bundle branch block No old tracing to compare Confirmed by Ward, Baxter Hire [phone]) on 05/07/2018 4:19:29 AM          Ward, Layla Maw, DO 05/07/18 0728   CRITICAL CARE Performed by: Baxter Hire Ward   Total critical care time: 40 minutes  Critical care time was exclusive of separately billable procedures and treating other patients.  Critical care was necessary to treat or prevent imminent or life-threatening deterioration.  Critical care  was time spent personally by me on the following activities: development of treatment plan with patient and/or surrogate as well as nursing, discussions with consultants, evaluation of patient's response to treatment, examination of patient, obtaining history from patient or surrogate, ordering and performing treatments and interventions, ordering and review of laboratory studies, ordering and review of radiographic studies, pulse oximetry and re-evaluation of patient's condition.    Ward, Layla Maw, DO 05/18/18 [phone]

## 2018-05-07 NOTE — H&P (Addendum)
Date: 05/07/2018               Patient Name:  Rachael Robertson MRN: [id number]  DOB: 03-06-1951 Age / Sex: 67 y.o., female   PCP: Patient, No Pcp Per         Medical Service: Internal Medicine Teaching Service         Attending Physician: Dr. Elesa Robertson, Rachael MawKristen N, DO    First Contact: Dr. Cleaster Robertson Pager: [phone]  Second Contact: Dr. Evelene Robertson Pager: [phone]       After Hours (After 5p/  First Contact Pager: [phone]  weekends / holidays): Second Contact Pager: [phone]   Chief Complaint: Blood in Trach  History of Present Illness:  Mrs. Rachael Robertson is a 67 yo female with chronic trach since 2017 normally on 2L O2 with ventilator at night and PMH of PE (on xarelto), tracheal spasm, CKD, COPD, and CHF who presented to the ED with SOB with suctioning of blood clots from her trach. She has been unable to use her vent for one week due to moving from [place] and being unable to bring this with her. She states the clots started appearing about one week ago after she flew to [place]. It was occurring for about once per day until yesterday when she started having to suction them out every four hours as they were blocking her airways. She states this has occurred a couple times before but would only occur once or twice before resolving. She has a cough at baseline and has had slight increase with production but is unsure what this looks like. She endorses mild sore throat and sinus pain.  She also states she has had chest tightness for the past two days but no chest pain.  She denies recent fever, chills, nausea, vomiting, changes in BM, or dysuria.  She states her vent was mostly placed due to her obesity and COPD. She has a history of her throat closing seemingly at random or when her trach is changed and carries an epi-pen with her for this. Her trach is normally changed every three weeks and was changed one week ago shortly before her move.   Social:  Prior to moving to [place] she lived at home  and was seen by a caregiver twice a day. She was doing physical therapy. She uses a walker to ambulate.  Moved to [place] to live closer to family from [place] one week ago and now lives by herself four minutes up the road from family. She does not yet have an established PCP or pulmonologist to manage her trach but has a Dr.'s appointment December 2nd.  She is a never smoke and drinks about twice per year. She does not use drugs recreationally.   Family History: Family History  Problem Relation Age of Onset  . Aneurysm Mother   . Heart disease Father   . Skin cancer Father   . Sleep apnea Father        TRACHEOSTOMY AS WELL  . Breast cancer Sister      Meds:  Current Meds  Medication Sig  . acetaminophen (TYLENOL) 500 MG tablet Take 500 mg by mouth 2 (two) times daily as needed for mild pain.   Marland Kitchen. albuterol (PROVENTIL HFA;VENTOLIN HFA) 108 (90 Base) MCG/ACT inhaler Inhale 1-2 puffs into the lungs every 6 (six) hours as needed for wheezing or shortness of breath.  . allopurinol (ZYLOPRIM) 300 MG tablet Take 300 mg by mouth daily.  . budesonide-formoterol (SYMBICORT) 160-4.5 MCG/ACT  inhaler Inhale 2 puffs into the lungs 2 (two) times daily.  . bumetanide (BUMEX) 2 MG tablet Take 2 mg by mouth daily.   . carvedilol (COREG) 3.125 MG tablet Take 3.125 mg by mouth 2 (two) times daily with a meal.   . cetirizine (ZYRTEC) 10 MG tablet Take 10 mg by mouth daily.  Marland Kitchen levothyroxine (SYNTHROID, LEVOTHROID) 50 MCG tablet Take 50 mcg by mouth daily before breakfast.  . omeprazole (PRILOSEC) 20 MG capsule Take 20 mg by mouth daily.  Marland Kitchen oxycodone (OXY-IR) 5 MG capsule Take 5 mg by mouth every 4 (four) hours as needed for pain.   . potassium chloride SA (K-DUR,KLOR-CON) 20 MEQ tablet Take 40 mEq by mouth daily.   . pramipexole (MIRAPEX) 1 MG tablet Take 1 mg by mouth 2 (two) times daily.  . Rivaroxaban (XARELTO PO) Take 1 tablet by mouth 2 (two) times daily.  Marland Kitchen spironolactone (ALDACTONE) 100 MG tablet  Take 100 mg by mouth daily.     Allergies: Allergies as of 05/07/2018 - Review Complete 05/07/2018  Allergen Reaction Noted  . Erythromycin  05/03/2018  . Naproxen  05/03/2018  . Sulfa antibiotics  05/03/2018   Past Medical History:  Diagnosis Date  . Obesity   . Opiate use   . Tracheostomy in place [facility])      Review of Systems: A complete ROS was negative except as per HPI.   Physical Exam: Blood pressure (!) 130/104, pulse 68, temperature 98.1 F (36.7 C), temperature source Oral, resp. rate 20, SpO2 100 %.  Constitution: morbidly obese, NAD, lying supine in bed HENT: 5L trach in place, no erythema or gross blood or swelling around trach Eyes: no scleral icterus, eom intact Cardio: RRR, distant heart sounds Respiratory: decreased breath sounds, clear to auscultation Abdominal: NTTP, soft, non-distended Neuro: a&o, cooperative, normal affect Skin: LE feet with petechiae, chronic venous stasis changes b/l LE, no pitting edema  EKG: personally reviewed my interpretation is T wave inversions anterior leads, multiple PVCs with no previous   CXR: personally reviewed my interpretation is cardiomegaly, atelectasis, limited by body habitus  CTA Chest: IMPRESSION: 1. The study is nondiagnostic for the evaluation of pulmonary emboli due to patient body habitus. 2. Mild atherosclerotic changes at the aorta. 3. Right greater than left posterior lower lobe airspace disease concerning for pneumonia or aspiration. 4. Diffuse ground-glass attenuation likely reflects mild edema. Electronically Signed   By: Rachael Robertson M.D.   On: 05/07/2018 07:01  Assessment & Plan by Problem: Active Problems:   * No active hospital problems. *  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  Chronic Respiratory Failure with Trach Tube Complication Obesity Hypoventilation Syndrome  Patient presenting with blood clots in trach on suctioning, morbid obesity, and CT significant for possible pneumonia, ground glass opacities signifying edema, and chronic changes secondary to her hypoventilation syndrome and morbid obesity. She has a history of PE and is on xarelto, CT unable to evaluate for possible PE due to weight. Her geneva score is 6 and does have risk factors but given her anticoagulant and no symptoms of increased leg pain or unilateral swelling, PE is thought to be less likely. Symptoms  could also be mechanical as well considering and had her trach placed one week ago. She has not had a vent in the evenings for the past week, and I think she may have aspiration with pneumonia in addition to decompensation related to her hypoventilation syndrome exacerbated without her normal vent. She only endorses mild productive cough, is afebrile, and has no leukocytosis.   - will contact previous PCP and pulmonologist for records and vent settings  - zosyn due to concern for aspiration  - telemetry  - respiratory consult for trach care - repeat EKG - RVP - PT/OT, and dietary consult - am BMP, CBC - duo-neb q6h prn - dulera 2 puff   CHF Will need records to determine whether preserved or reduced EF. Baseline weight is 452 per patient. She does not appear to be volume overloaded but this is difficult to determine due to her weight. She endorses recent chest tightness but no chest pain.   - ECHO ordered  - daily weights, strict I/O's - cont. Home medications bumex 2mg  qd, coreg 3.125 bid, aldactone 100 mg qd  Hypothyroidism  - TSH  Diet: heart healthy VTE: xarelto IVF: 500 cc bolus NS Code: Full   Dispo: Admit patient to Inpatient with expected length of stay greater than 2  midnights.  SignedVersie Starks, DO 05/07/2018, 7:32 AM  Pager: [phone]

## 2018-05-08 DIAGNOSIS — J9612 Chronic respiratory failure with hypercapnia: Secondary | ICD-10-CM

## 2018-05-08 DIAGNOSIS — I5042 Chronic combined systolic (congestive) and diastolic (congestive) heart failure: Secondary | ICD-10-CM

## 2018-05-08 DIAGNOSIS — J9611 Chronic respiratory failure with hypoxia: Secondary | ICD-10-CM

## 2018-05-08 DIAGNOSIS — R233 Spontaneous ecchymoses: Secondary | ICD-10-CM

## 2018-05-08 DIAGNOSIS — J449 Chronic obstructive pulmonary disease, unspecified: Secondary | ICD-10-CM

## 2018-05-08 DIAGNOSIS — Z9981 Dependence on supplemental oxygen: Secondary | ICD-10-CM

## 2018-05-08 DIAGNOSIS — E662 Morbid (severe) obesity with alveolar hypoventilation: Secondary | ICD-10-CM

## 2018-05-08 DIAGNOSIS — Z7901 Long term (current) use of anticoagulants: Secondary | ICD-10-CM

## 2018-05-08 DIAGNOSIS — Z86711 Personal history of pulmonary embolism: Secondary | ICD-10-CM

## 2018-05-08 DIAGNOSIS — N189 Chronic kidney disease, unspecified: Secondary | ICD-10-CM

## 2018-05-08 DIAGNOSIS — T45515A Adverse effect of anticoagulants, initial encounter: Secondary | ICD-10-CM

## 2018-05-08 DIAGNOSIS — I509 Heart failure, unspecified: Secondary | ICD-10-CM

## 2018-05-08 DIAGNOSIS — R042 Hemoptysis: Secondary | ICD-10-CM

## 2018-05-08 DIAGNOSIS — Z79899 Other long term (current) drug therapy: Secondary | ICD-10-CM

## 2018-05-08 DIAGNOSIS — I878 Other specified disorders of veins: Secondary | ICD-10-CM

## 2018-05-08 DIAGNOSIS — I130 Hypertensive heart and chronic kidney disease with heart failure and stage 1 through stage 4 chronic kidney disease, or unspecified chronic kidney disease: Secondary | ICD-10-CM

## 2018-05-08 DIAGNOSIS — D6832 Hemorrhagic disorder due to extrinsic circulating anticoagulants: Secondary | ICD-10-CM

## 2018-05-08 LAB — COMPREHENSIVE METABOLIC PANEL
ALT: 18 U/L (ref 0–44)
AST: 14 U/L — ABNORMAL LOW (ref 15–41)
Albumin: 2.6 g/dL — ABNORMAL LOW (ref 3.5–5.0)
Alkaline Phosphatase: 61 U/L (ref 38–126)
Anion gap: 8 (ref 5–15)
BUN: 49 mg/dL — ABNORMAL HIGH (ref 8–23)
CO2: 39 mmol/L — ABNORMAL HIGH (ref 22–32)
Calcium: 8.7 mg/dL — ABNORMAL LOW (ref 8.9–10.3)
Chloride: 95 mmol/L — ABNORMAL LOW (ref 98–111)
Creatinine, Ser: 1.41 mg/dL — ABNORMAL HIGH (ref 0.44–1.00)
GFR calc Af Amer: 45 mL/min — ABNORMAL LOW (ref >60)
GFR calc non Af Amer: 39 mL/min — ABNORMAL LOW (ref >60)
Glucose, Bld: 128 mg/dL — ABNORMAL HIGH (ref 70–99)
POTASSIUM: 4.6 mmol/L (ref 3.5–5.1)
Sodium: 142 mmol/L (ref 135–145)
Total Bilirubin: 0.3 mg/dL (ref 0.3–1.2)
Total Protein: 7.5 g/dL (ref 6.5–8.1)

## 2018-05-08 LAB — CBC
HCT: 29.1 % — ABNORMAL LOW (ref 36.0–46.0)
HEMOGLOBIN: 8.3 g/dL — AB (ref 12.0–15.0)
MCH: 26.5 pg (ref 26.0–34.0)
MCHC: 28.5 g/dL — ABNORMAL LOW (ref 30.0–36.0)
MCV: 93 fL (ref 80.0–100.0)
Platelets: 115 10*3/uL — ABNORMAL LOW (ref 150–400)
RBC: 3.13 MIL/uL — ABNORMAL LOW (ref 3.87–5.11)
RDW: 15.2 % (ref 11.5–15.5)
WBC: 5.8 10*3/uL (ref 4.0–10.5)
nRBC: 0.3 % — ABNORMAL HIGH (ref 0.0–0.2)

## 2018-05-08 LAB — HIV ANTIBODY (ROUTINE TESTING W REFLEX): HIV Screen 4th Generation wRfx: NONREACTIVE

## 2018-05-08 MED ORDER — SODIUM CHLORIDE 0.9 % IV SOLN
3.0000 g | Freq: Three times a day (TID) | INTRAVENOUS | Status: DC
  Administered 2018-05-08 – 2018-05-11 (×9): 3 g via INTRAVENOUS
  Filled 2018-05-08 (×10): qty 3

## 2018-05-08 NOTE — Progress Notes (Signed)
   Subjective:  Endorses some SOB of breath today increased from yesterday. Chest tightness resolved. She states blood clots are still coming from her trach. She is coughing as well but no fever or nausea.   Social: Moved here to be with family, has a caretaker visit in the am and food delivered. Lives alone but 4 minutes from her family. Ambulates with walker.   Objective:  Vital signs in last 24 hours: Vitals:   05/07/18 2010 05/07/18 2232 05/08/18 0007 05/08/18 0210  BP:   (!) 116/57   Pulse:   66 72  Resp:    18  Temp:   98.6 F (37 C)   TempSrc:   Oral   SpO2: 90% 90% 91% 92%  Weight:      Height:       Physical Exam Constitution: morbidly obese, lying supine in bed  Cardio: distant heart sounds, RRR, no m/r/g; + pedal pulses, warm extremities Respiratory: distant lung sounds 2/2 obesity, CTA, trach in place with 2L [place] Abdominal: soft, non-distended, NTTP GU: purewick in place Neuro: a&o, pleasant, cooperative Skin: b/l LE venous stasis changes with small amount petechia feet,    Assessment/Plan:  Active Problems:   Chronic respiratory failure with hypoxia (HCC)  [name] is a 67 yo female that moved to [place] one week ago via flight with morbid obesity, obesity hypoventilation syndrome on chronic trach since 2017 normally on 2L O2 with ventilator at night, CHF, and CKD with PMH of PE (on xarelto) and tracheal spasms who presented to the ED with SOB with suctioning of blood clots from her trach. She has not had a vent at night for the past week due to being unable to bring this with her when she moved down. She is working to establish primary care and pulm doctor and has appointments for both. Also have not been able to obtain her medical records despite fax[facility] [phone] and [phone].  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 Unable to obtain medical records as of yet. Case discussed with pulm who will see her to set up vent prior to discharge and to discuss blood clots in trach, which are likely secondary to pneumonia/aspiration seen on CT.   - switch zosyn to unasyn  - strict I/O's, daily weights - not recorded, will reiterate need - f/u pulm recs  - consult to social work  - cont. Trach care - PT/OT consulted  - will review online chart with patient  - am bmp, cbc  CHF Still unable to obtain records. Echo yesterday shows EF 45-50% with diffuse hypokinesis, LVH and elevated LV pressures. She appears euvolemic on exam. She states she is at her baseline weight. Slight creatinine bump, and considering CT findings we will restarted her bumex today.   - restart bumex today  - cont. Coreg 3.125 mg bid, aldactone 100 mg qd   VTE: xarelto IVF: none Diet: heart healthy Code: full   Dispo: Anticipated discharge in approximately 1-2 days.   Versie StarksSeawell, Carisa Backhaus A, DO 05/08/2018, 6:51 AM Pager: [phone]

## 2018-05-08 NOTE — Progress Notes (Signed)
Patient recently moved to [place] from [place] to be closer to family.  Her sister, Leisa LenzCarrie Dickson, is patient's POA.  Patient needs oxygen at home and is having a difficult time obtaining oxygen because patient does not have a PCP in this area.  Ms. Edilia BoDickson arranged a doctor's appointment for her sister on Monday, December 2nd and she is concerned about her sister making the appointment since she is currently hospitalized.  Ms. Edilia BoDickson and patient are worried if the appointment is missed, it may take weeks before the patient can get another one.  The main objective of the appointment on December 2nd is to establish a PCP for the patient.  If physician or anyone else needs to speak with Leisa Lenzarrie Dickson, please call or text her at [phone].

## 2018-05-08 NOTE — Progress Notes (Signed)
Pharmacy Antibiotic Note  Rachael KluverKristy Robertson is a 67 y.o. female admitted on 05/07/2018 with concern for VAP, on trach.  Previously treated with zosyn. Now switching to unasyn.  SCr trending up to 1.41, CrCl 68.6 mL/min.  Plan: DC Zosyn  Unasyn 3g IV Q8Hrs Monitor renal function F/u Cx, LOT and ability to narrow  Height: 5' (152.4 cm) Weight: (!) 452 lb (205 kg) IBW/kg (Calculated) : 45.5  Temp (24hrs), Avg:98.6 F (37 C), Min:98.4 F (36.9 C), Max:98.7 F (37.1 C)  Recent Labs  Lab 05/07/18 0500 05/08/18 0235  WBC 5.7 5.8  CREATININE 1.34* 1.41*    Estimated Creatinine Clearance: 68.6 mL/min (A) (by C-G formula based on SCr of 1.41 mg/dL (H)).    Allergies  Allergen Reactions  . Erythromycin     Rash and Hives  . Naproxen     Makes her congested  . Sulfa Antibiotics     Hives    Antimicrobials this admission: Zosyn 11/29>>11/30 Unasyn 11/30>>  Dose adjustments this admission: n/a  Microbiology results: 11/29 BCx:  Resp PCR: negative  Rachael Robertson, PharmD PGY1 Pharmacy Resident Phone [phone] 05/08/2018 9:04 AM

## 2018-05-08 NOTE — Evaluation (Signed)
Physical Therapy Evaluation Patient Details Name: Rachael Robertson MRN: [id number] DOB: 03/20/1951 Today's Date: 05/08/2018   History of Present Illness  pt was admitted for chronic respiratory failure; found to have blood clots in trach when suctioning. She uses vent at night, has PMH:  CKD, PE, COPD, CHF.  She moved to this area a week prior to admission from [place]    Clinical Impression  Pt admitted with above diagnosis. Pt currently with functional limitations due to the deficits listed below (see PT Problem List). PTA, pt recently moving to [place] into house by herself and arranging aides to come 2 hours in the AM, reports family support for grocery shopping and ability to walk short household distances with RW. Today, pt recently up with OT with fecal incontinence she reports is due to hospital diet with hx of diminished rectal sphincter. Pt required +4 staff in prior standing and appears to struggle with hospital bari bed, as well as report feeling weaker than baseline. While she states she is confident in returning home to herself, it is hard to imagine given todays eval that would be manage alone at this time. Will cont to see and progress towards HHPT as possible. Pt will benefit from skilled PT to increase their independence and safety with mobility to allow discharge to the venue listed below.        Follow Up Recommendations SNF(Pt wishes to return home with aide )    Equipment Recommendations    TBD   Recommendations for Other Services       Precautions / Restrictions Precautions Precautions: Fall Precaution Comments: trach Restrictions Weight Bearing Restrictions: No      Mobility  Bed Mobility Overal bed mobility: Needs Assistance Bed Mobility: Supine to Sit;Sit to Supine     Supine to sit: Mod assist;+2 for physical assistance Sit to supine: Mod assist;+2 for physical assistance   General bed mobility comments: assist for trunk for getting up and for bil LEs for back  to bed. Pt pulled herself up with bedrails on air mattress in trendelenberg.  Extra time for all activities and to catch breath  Transfers Overall transfer level: Needs assistance Equipment used: Rolling walker (2 wheeled) Transfers: Sit to/from Stand Sit to Stand: Min assist;+2 physical assistance;From elevated surface         General transfer comment: deferred due to patient fatigue and lose BM  Ambulation/Gait                Stairs            Wheelchair Mobility    Modified Rankin (Stroke Patients Only)       Balance                                             Pertinent Vitals/Pain Pain Assessment: Faces Faces Pain Scale: Hurts a little bit Pain Location: feet hypersensitive; ankles/knees Pain Descriptors / Indicators: Sore Pain Intervention(s): Limited activity within patient's tolerance;Monitored during session    Home Living Family/patient expects to be discharged to:: Private residence Living Arrangements: Alone               Additional Comments: pt wants to return home. She lives alone and has an aide 2x day x 2 hours.      Prior Function Level of Independence: Needs assistance         Comments:  used RW and completed bathing toileting herself. Aide set her up for dressing and did meals. plans on setting up caregivers for 2 hours a day in the morning     Hand Dominance        Extremity/Trunk Assessment   Upper Extremity Assessment Upper Extremity Assessment: RUE deficits/detail;LUE deficits/detail RUE Deficits / Details: bil shoulders limited with rotator cuff problems.  Pt can lift approximately 20 degrees. Good functional strength to pull herself up in bed    Lower Extremity Assessment Lower Extremity Assessment: Overall WFL for tasks assessed       Communication   Communication: No difficulties;Passy-Muir valve  Cognition Arousal/Alertness: Awake/alert Behavior During Therapy: WFL for tasks  assessed/performed Overall Cognitive Status: Within Functional Limits for tasks assessed                                        General Comments General comments (skin integrity, edema, etc.): pt reliant upon Rw when standing.  Wears her shoes without tying them.  Sats 92-95%.  trach collar 02 turned up during activity. On 10 at rest    Exercises General Exercises - Lower Extremity Ankle Circles/Pumps: 10 reps Quad Sets: 10 reps Gluteal Sets: 10 reps Short Arc Quad: 10 reps Heel Slides: 10 reps Hip ABduction/ADduction: 10 reps Straight Leg Raises: 10 reps   Assessment/Plan    PT Assessment Patient needs continued PT services  PT Problem List Obesity;Cardiopulmonary status limiting activity;Decreased activity tolerance       PT Treatment Interventions DME instruction;Functional mobility training;Therapeutic activities;Stair training;Gait training;Therapeutic exercise    PT Goals (Current goals can be found in the Care Plan section)  Acute Rehab PT Goals Patient Stated Goal: home PT Goal Formulation: With patient Potential to Achieve Goals: Good    Frequency Min 2X/week   Barriers to discharge Decreased caregiver support      Co-evaluation               AM-PAC PT "6 Clicks" Mobility  Outcome Measure Help needed turning from your back to your side while in a flat bed without using bedrails?: A Lot Help needed moving from lying on your back to sitting on the side of a flat bed without using bedrails?: A Lot Help needed moving to and from a bed to a chair (including a wheelchair)?: A Lot Help needed standing up from a chair using your arms (e.g., wheelchair or bedside chair)?: A Lot Help needed to walk in hospital room?: Total Help needed climbing 3-5 steps with a railing? : Total 6 Click Score: 10    End of Session Equipment Utilized During Treatment: Oxygen(trach collar 10L) Activity Tolerance: Patient limited by fatigue Patient left: in  bed;with call bell/phone within reach Nurse Communication: Mobility status PT Visit Diagnosis: Unsteadiness on feet (R26.81);Muscle weakness (generalized) (M62.81)    Time: 4098-1191 PT Time Calculation (min) (ACUTE ONLY): 23 min   Charges:   PT Evaluation $PT Eval High Complexity: 1 High         Rachael Robertson, PT, DPT Acute Rehabilitation Services Pager: [phone] Office: [phone]    Rachael Robertson 05/08/2018, 6:06 PM

## 2018-05-08 NOTE — Consult Note (Signed)
NAME:  Rachael Robertson, MRN:  [id number], DOB:  [birth year], LOS: 1 ADMISSION DATE:  05/07/2018, CONSULTATION DATE: 05/08/2018 REFERRING MD: Heide SparkNarendra, CHIEF COMPLAINT: Obesity hypoventilation, hemoptysis  Brief History   Patient with a history of obesity hypoventilation, chronic nocturnal ventilation History of chronic obstructive pulmonary disease, congestive heart failure History of chronic kidney disease  History of present illness   Patient came in with hemoptysis Noted to have blood clots from a trach She recently moved from [place] Has not been on a ventilator for the last few days Recently had a trach changed about 2 weeks ago Noticed clots in her trach, with worsening, decided to come to the hospital  Past Medical History  History of obesity hypoventilation-has had a trach and nocturnal ventilation for 2 years One episode of PE about 7 years ago-has been on blood thinners since then History of hypercapnic respiratory failure with multiple intubations on ventilator requirement in the past   Significant Hospital Events   Still has occasional bleeding/clots from a trach but less than what it was before  Consults:    Procedures:    Significant Diagnostic Tests:  CT scan of the chest IMPRESSION: 1. The study is nondiagnostic for the evaluation of pulmonary emboli due to patient body habitus. 2. Mild atherosclerotic changes at the aorta. 3. Right greater than left posterior lower lobe airspace disease concerning for pneumonia or aspiration. 4. Diffuse ground-glass attenuation likely reflects mild edema.  Micro Data:  Blood cultures on 05/07/2018-negative so far  Antimicrobials:  Unasyn 11/30>> Levofloxacin 11/29 Zosyn 11/29>>  Interim history/subjective:  She feels a little bit better she is on a trach Still occasional bleeding from the trach   Objective   Blood pressure (!) 116/51, pulse (!) 56, temperature 98.2 F (36.8 C), temperature source Oral, resp.  rate 18, height 5' (1.524 m), weight (!) 206.8 kg, SpO2 100 %.    FiO2 (%):  [35 %-40 %] 40 %   Intake/Output Summary (Last 24 hours) at 05/08/2018 1710 Last data filed at 05/07/2018 2230 Gross per 24 hour  Intake 5.1 ml  Output 850 ml  Net -844.9 ml   Filed Weights   05/07/18 0807 05/08/18 1110  Weight: (!) 205 kg (!) 206.8 kg    Examination: General: Morbidly obese lady, does appear comfortable HENT: Moist oral mucosa Trach site looks fair Lungs: Poor air movement bilaterally Cardiovascular: S1-S2 appreciated Abdomen: Obese Extremities: Edema Neuro: Alert and oriented x3, moving all extremities  Resolved Hospital Problem list     Assessment & Plan:  Hemoptysis Likely multifactorial relating to recent trach change, being on anticoagulation May also have an underlying infectious process however was not having any fevers or chills CT scan does reveal groundglass changes which may be on account of the bleeding Continue to trend hematocrit  Obesity hypoventilation syndrome She was managed on a ventilator at home Will need to have social service set up with a DME company for a nocturnal ventilator Trilogy ventilator Bicarb level of 39 on BMP  Chronic respiratory failure Related to obesity hypoventilation Has an underlying history of obstructive lung disease  Chronic obstructive pulmonary disease Continue bronchodilator treatments Continue maintenance inhalers  She states that she feels she would like to be decannulated at some point She will need to lose a significant amount of weight and increase physical activity to overcome the physiological changes associated with her obesity hypoventilation  If she has significant bleeding/hemoptysis A balance as to be rates between continued anticoagulation and risk of thromboembolism  She had an event about 7 years ago-unclear whether she is still predisposed to a thromboembolic event although she is less ambulant and the risk  is always there although  Best practice:  Diet: Heart healthy DVT prophylaxis: On anticoagulation with Xarelto Mobility: As tolerated Code Status: Full code Family Communication: No family at bedside Disposition:   Labs   CBC: Recent Labs  Lab 05/07/18 0500 05/08/18 0235  WBC 5.7 5.8  NEUTROABS 4.5  --   HGB 9.1* 8.3*  HCT 32.7* 29.1*  MCV 94.5 93.0  PLT 136* 115*    Basic Metabolic Panel: Recent Labs  Lab 05/07/18 0500 05/08/18 0235  NA 143 142  K 4.5 4.6  CL 95* 95*  CO2 42* 39*  GLUCOSE 91 128*  BUN 59* 49*  CREATININE 1.34* 1.41*  CALCIUM 9.3 8.7*   GFR: Estimated Creatinine Clearance: 69.1 mL/min (A) (by C-G formula based on SCr of 1.41 mg/dL (H)). Recent Labs  Lab 05/07/18 0500 05/08/18 0235  WBC 5.7 5.8    Liver Function Tests: Recent Labs  Lab 05/08/18 0235  AST 14*  ALT 18  ALKPHOS 61  BILITOT 0.3  PROT 7.5  ALBUMIN 2.6*   No results for input(s): LIPASE, AMYLASE in the last 168 hours. No results for input(s): AMMONIA in the last 168 hours.  ABG No results found for: PHART, PCO2ART, PO2ART, HCO3, TCO2, ACIDBASEDEF, O2SAT   Coagulation Profile: Recent Labs  Lab 05/07/18 0500  INR 1.04    Cardiac Enzymes: No results for input(s): CKTOTAL, CKMB, CKMBINDEX, TROPONINI in the last 168 hours.  HbA1C: No results found for: HGBA1C  CBG: No results for input(s): GLUCAP in the last 168 hours.  Review of Systems:   Review of Systems  Constitutional: Negative.   HENT: Negative.   Eyes: Negative.   Respiratory: Positive for hemoptysis and shortness of breath.   Cardiovascular: Negative.   Skin: Negative.    Past Medical History  She,  has a past medical history of Obesity, Opiate use, and Tracheostomy in place [facility]).   Surgical History    Past Surgical History:  Procedure Laterality Date  . Perianal abscess    . TRACHELECTOMY       Social History   reports that she is a non-smoker but has been exposed to tobacco smoke.  She has never used smokeless tobacco. She reports that she drinks alcohol.   Family History   Her family history includes Aneurysm in her mother; Breast cancer in her sister; Heart disease in her father; Skin cancer in her father; Sleep apnea in her father.   Allergies Allergies  Allergen Reactions  . Erythromycin     Rash and Hives  . Naproxen     Makes her congested  . Sulfa Antibiotics     Hives

## 2018-05-08 NOTE — Progress Notes (Signed)
Date: 05/08/2018  Patient name: Rachael KluverKristy Robertson  Medical record number: [id number]  Date of birth: [birth year]   I have seen and evaluated Rachael Robertson and discussed their care with the Residency Team.  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  Patient is originally from [place] and moved to [place] approximately 1 week ago.  She is on the vent at night but was unable to bring this with her from [place].  She states that after moving here she noted blood clots from her trach while suctioning.  She states that initially the blood clots from occurring once a day but on the day prior to admission she started having to suction out blood clots every 4 hours from her trach.  Patient also complains of a chronic cough at baseline but states that this is worsened slightly and that she occasionally gets some blood clots up when she is coughing.  No fevers or chills, no nausea or vomiting, no abdominal pain, no lightheadedness, no syncope, no focal weakness, tingling or numbness, no headache, no blurry vision.  She does note that her trach was changed shortly before she moved here.  Today patient states that she still has some shortness of breath and that she is still bringing up clots while suctioning.  She denies any other complaints at this time.  PMHx, Fam Hx, and/or Soc Hx : As per resident admit note  Vitals:   05/08/18 0744 05/08/18 0817  BP: (!) 118/53   Pulse: (!) 55 60  Resp: 16   Temp: 98.4 F (36.9 C)   SpO2: 97%    General: Awake, alert, oriented x3, NAD CVs: Regular rate and rhythm, distant heart sounds Lungs: Difficult to auscultate given her  body habitus, no audible crackles or wheezes Abdomen: Soft, obese, nontender, normoactive bowel sounds Extremities: Chronic venous stasis changes in bilateral lower extremities  Assessment and Plan: I have seen and evaluated the patient as outlined above. I agree with the formulated Assessment and Plan as detailed in the residents' note, with the following changes:   1.  Hemoptysis: -Patient presented to the ED with worsening shortness of breath as well as suctioning blood clots from her trach in the setting of a history of being on anticoagulation for PE as well as recent trach change.  I suspect that her hemoptysis may be multifactorial.  On imaging it appears that she may be slightly volume overloaded as well as have a likely underlying pneumonia which could cause hemoptysis.  Patient also had a recent trach change and may have some bleeding from this as well. -Patient's hemoglobin is slightly decreased from yesterday (down from 9.1-8.3).  We will monitor her CBC closely -Continue with Unasyn to cover for possible aspiration pneumonia -Continue with Bumex for possible fluid overload.  Strict I's and O's and daily weights. -Would consider PCCM eval given patient was on the vent at night at home and states that she has not been able to get a new one here.  I am also concerned about her hemoptysis and would like their input as to any additional testing or management that we may need. -No further viral panel was negative -PT/OT evaluation pending -Continue  duo nebs and Dulera for COPD -2D echo results noted -No further work-up at this time  Earl Lagos, MD 11/30/201911:06 AM

## 2018-05-08 NOTE — Evaluation (Addendum)
Occupational Therapy Evaluation Patient Details Name: Rachael Robertson MRN: [id number] DOB: March 10, 1951 Today's Date: 05/08/2018    History of Present Illness pt was admitted for chronic respiratory failure; found to have blood clots in trach when suctioning. She uses vent at night, has PMH:  CKD, PE, COPD, CHF.  She moved to this area a week prior to admission from [place]   Clinical Impression   This 67 year old female was admitted for the above. She recently moved to this area and had an aide come 2x day for 2 hours, mostly for IADLs and to set her up for dressing (dress and shoes).  She needs mod +2 assistance for bed  Mobility and total A for hygiene.  She wants to get back to baseline, where she can manage with the help she has and hopes to discharge directly home from hospital.  Will follow in acute setting with min guard level goals     Follow Up Recommendations  SNF(pt wants home and to make MD appt on 12/2) If pt is able to go home, recommend HHOT also   Equipment Recommendations  None recommended by OT(has hospital bed/airmattress; shower seat)    Recommendations for Other Services       Precautions / Restrictions Precautions Precautions: Fall Precaution Comments: trach Restrictions Weight Bearing Restrictions: No      Mobility Bed Mobility Overal bed mobility: Needs Assistance Bed Mobility: Supine to Sit;Sit to Supine     Supine to sit: Mod assist;+2 for physical assistance Sit to supine: Mod assist;+2 for physical assistance   General bed mobility comments: assist for trunk for getting up and for bil LEs for back to bed. Pt pulled herself up with bedrails on air mattress in trendelenberg.  Extra time for all activities and to catch breath  Transfers Overall transfer level: Needs assistance Equipment used: Rolling walker (2 wheeled) Transfers: Sit to/from Stand Sit to Stand: Min assist;+2 physical assistance;From elevated surface         General transfer  comment: assist to rise and steady extra time    Balance                                           ADL either performed or assessed with clinical judgement   ADL Overall ADL's : Needs assistance/impaired Eating/Feeding: Set up   Grooming: Set up   Upper Body Bathing: Minimal assistance   Lower Body Bathing: Total assistance;Sit to/from stand;+2 for physical assistance   Upper Body Dressing : Moderate assistance;Sitting   Lower Body Dressing: Total assistance;+2 for physical assistance;Sit to/from stand       Toileting- Architect and Hygiene: Total assistance;+2 for physical assistance;Sit to/from stand         General ADL Comments: pt reports she doesn't have control of anus spincter.  RN, NTs x 2 and OT present for bed mobility and to assist with hygiene.  Pt has methods of moving and directed [place] how to help her best.  Tolerated standing/propping on walker with min guard x 1     Vision         Perception     Praxis      Pertinent Vitals/Pain Pain Assessment: Faces Faces Pain Scale: Hurts a little bit Pain Location: feet hypersensitive; ankles/knees Pain Descriptors / Indicators: Sore Pain Intervention(s): Limited activity within patient's tolerance;Monitored during session;Repositioned     Hand  Dominance     Extremity/Trunk Assessment Upper Extremity Assessment Upper Extremity Assessment: RUE deficits/detail;LUE deficits/detail RUE Deficits / Details: bil shoulders limited with rotator cuff problems.  Pt can lift approximately 20 degrees. Good functional strength to pull herself up in bed           Communication Communication Communication: No difficulties;Passy-Muir valve   Cognition Arousal/Alertness: Awake/alert Behavior During Therapy: WFL for tasks assessed/performed Overall Cognitive Status: Within Functional Limits for tasks assessed                                     General Comments  pt reliant  upon Rw when standing.  Wears her shoes without tying them.  Sats 92-95%.  trach collar 02 turned up during activity. On 10 at rest  Needs extra time for all activities.     Exercises     Shoulder Instructions      Home Living Family/patient expects to be discharged to:: Unsure                                 Additional Comments: pt wants to return home. She lives alone and has an aide 2x day x 2 hours.        Prior Functioning/Environment Level of Independence: Independent with assistive device(s)        Comments: used RW and completed bathing toileting herself. Aide set her up for dressing and did meals        OT Problem List: Decreased strength;Decreased range of motion;Decreased activity tolerance;Impaired balance (sitting and/or standing);Cardiopulmonary status limiting activity;Obesity;Pain      OT Treatment/Interventions: Self-care/ADL training;Energy conservation;DME and/or AE instruction;Balance training;Patient/family education;Therapeutic activities;Therapeutic exercise    OT Goals(Current goals can be found in the care plan section) Acute Rehab OT Goals Patient Stated Goal: home OT Goal Formulation: With patient Time For Goal Achievement: 05/22/18 Potential to Achieve Goals: Good ADL Goals Pt Will Transfer to Toilet: with min guard assist;ambulating;bedside commode Additional ADL Goal #1: pt will perform bed mobility at min guard level in preparation for adls/toilet transfers Additional ADL Goal #2: pt will perform adls with setup/min guard using AE as needed  OT Frequency: Min 2X/week   Barriers to D/C:            Co-evaluation              AM-PAC OT "6 Clicks" Daily Activity     Outcome Measure Help from another person eating meals?: A Little Help from another person taking care of personal grooming?: A Little Help from another person toileting, which includes using toliet, bedpan, or urinal?: Total Help from another person bathing  (including washing, rinsing, drying)?: A Lot Help from another person to put on and taking off regular upper body clothing?: A Lot Help from another person to put on and taking off regular lower body clothing?: Total 6 Click Score: 12   End of Session    Activity Tolerance: Patient tolerated treatment well Patient left: in bed;with call bell/phone within reach  OT Visit Diagnosis: Muscle weakness (generalized) (M62.81)                Time: 1478-2956 OT Time Calculation (min): 55 min Charges:  OT General Charges $OT Visit: 1 Visit OT Evaluation $OT Eval Moderate Complexity: 1 Mod OT Treatments $Self Care/Home Management : 23-37 mins $Therapeutic Activity:  8-22 mins  Marica OtterMaryellen Yaneth Fairbairn, OTR/L Acute Rehabilitation Services [phone] [facility] pager [phone] office 05/08/2018   Sevilla Murtagh 05/08/2018, 3:57 PM

## 2018-05-08 NOTE — Social Work (Signed)
CSW acknowledging consult- "Patient is having a difficult time getting oxygen at home. Patient does not have a PCP which is why obtaining oxygen is a challenge. Patient's sister and POA is Rachael Robertson. She can be reached via phone or text at [phone]."   For assistance with home O2 or PCP please consult RN Case Manager.  CSW signing off. Please consult if any additional needs arise.  Rachael HutchingIsabel H Draedyn Weidinger, LCSWA [facility] Clinical Social Work [phone]

## 2018-05-09 LAB — BASIC METABOLIC PANEL
Anion gap: 9 (ref 5–15)
BUN: 46 mg/dL — ABNORMAL HIGH (ref 8–23)
CO2: 40 mmol/L — ABNORMAL HIGH (ref 22–32)
Calcium: 8.7 mg/dL — ABNORMAL LOW (ref 8.9–10.3)
Chloride: 94 mmol/L — ABNORMAL LOW (ref 98–111)
Creatinine, Ser: 1.42 mg/dL — ABNORMAL HIGH (ref 0.44–1.00)
GFR calc Af Amer: 45 mL/min — ABNORMAL LOW (ref >60)
GFR calc non Af Amer: 39 mL/min — ABNORMAL LOW (ref >60)
Glucose, Bld: 87 mg/dL (ref 70–99)
Potassium: 4.4 mmol/L (ref 3.5–5.1)
Sodium: 143 mmol/L (ref 135–145)

## 2018-05-09 LAB — CBC
HCT: 31.5 % — ABNORMAL LOW (ref 36.0–46.0)
Hemoglobin: 8.5 g/dL — ABNORMAL LOW (ref 12.0–15.0)
MCH: 25.7 pg — ABNORMAL LOW (ref 26.0–34.0)
MCHC: 27 g/dL — ABNORMAL LOW (ref 30.0–36.0)
MCV: 95.2 fL (ref 80.0–100.0)
Platelets: 117 10*3/uL — ABNORMAL LOW (ref 150–400)
RBC: 3.31 MIL/uL — ABNORMAL LOW (ref 3.87–5.11)
RDW: 15.3 % (ref 11.5–15.5)
WBC: 6.9 10*3/uL (ref 4.0–10.5)
nRBC: 0 % (ref 0.0–0.2)

## 2018-05-09 LAB — MAGNESIUM: Magnesium: 1.9 mg/dL (ref 1.7–2.4)

## 2018-05-09 MED ORDER — LOPERAMIDE HCL 2 MG PO CAPS
2.0000 mg | ORAL_CAPSULE | ORAL | Status: DC | PRN
  Administered 2018-05-14: 2 mg via ORAL
  Filled 2018-05-09: qty 1

## 2018-05-09 MED ORDER — SILVER SULFADIAZINE 1 % EX CREA
TOPICAL_CREAM | Freq: Every day | CUTANEOUS | Status: DC
  Administered 2018-05-09 – 2018-05-16 (×8): via TOPICAL
  Filled 2018-05-09 (×2): qty 85

## 2018-05-09 MED ORDER — DICLOFENAC SODIUM 1 % TD GEL
2.0000 g | Freq: Every day | TRANSDERMAL | Status: DC | PRN
  Administered 2018-05-10 – 2018-05-16 (×7): 2 g via TOPICAL
  Filled 2018-05-09: qty 100

## 2018-05-09 MED ORDER — CHOLESTYRAMINE 4 G PO PACK
4.0000 g | PACK | Freq: Every day | ORAL | Status: DC
  Administered 2018-05-09 – 2018-05-17 (×9): 4 g via ORAL
  Filled 2018-05-09 (×9): qty 1

## 2018-05-09 NOTE — Care Management Note (Signed)
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 She has been without her home vent for this time as she states that her PCP could not secure one here because she did not have local MD.  CM spoke w IM resident who stated they would agree to see her initially after DC so HH and DME services could be arranged.   She now lives in [name], alone. She has a caregiver that provides 2 hours of care in the AM and PM through private insurance, and she states she has a niece that lives up the road.  Patient states that she has a nebulizer, a "humidified nebulizer" on it's way here. She estimates that it should be here "in a week or so."  She states that she rented oxygen for the flight here, and had oxygen in [place], but does not have oxygen here.  She states that she has suction and portable suction at home.   Consult in place for Trilogy home vent. [facility]. CM will continue to follow.   Expected Discharge Date:                  Expected Discharge Plan:     In-House Referral:     Discharge planning Services     Post Acute Care Choice:    Choice offered to:     DME Arranged:  [facility] DME Agency:  [facility]  HH Arranged[facility]    [facility] Agency:     Status of Service:  In process, will continue to follow  If discussed at Long Length of Stay Meetings, dates discussed:    Additional Comments:  Lawerance SabalDebbie Jaleigh Mccroskey, RN 05/09/2018, 1:25 PM

## 2018-05-09 NOTE — Progress Notes (Signed)
   Subjective:  Rachael Robertson was seen resting comfortably in her bed this morning. She mentioned that she feels that her breathing has improved and the amount of clots from her trach have decreased.   Objective:  Vital signs in last 24 hours: Vitals:   05/08/18 1500 05/08/18 1707 05/08/18 2045 05/09/18 0032  BP:  (!) 116/51  (!) 147/62  Pulse: 62 (!) 56 65 62  Resp: 18  18 20   Temp:  98.2 F (36.8 C)  97.8 F (36.6 C)  TempSrc:  Oral    SpO2: 98% 100% 91% 98%  Weight:      Height:       Physical Exam  Constitutional: She appears well-developed and well-nourished. No distress.  HENT:  Head: Normocephalic and atraumatic.  Eyes: Conjunctivae are normal.  Cardiovascular: Normal rate, regular rhythm and normal heart sounds.  Respiratory: Effort normal and breath sounds normal. No respiratory distress. She has no wheezes.  Specs of dried blood found around trach  GI: Soft. Bowel sounds are normal. She exhibits no distension. There is no tenderness.  Genitourinary:  Genitourinary Comments: purewick in place  Musculoskeletal: She exhibits no edema.  Neurological: She is alert.  Skin: She is not diaphoretic. There is erythema (right antecubital fossa).  Psychiatric: She has a normal mood and affect. Her behavior is normal. Judgment and thought content normal.   Assessment/Plan:  Rachael Robertson is a 67 y.o woman with ohs, morbid obesity, chronic trach since 2017, chronic nocturnal ventilation, hx of multiple intubations, and hx of PE 7 years ago on xarelto from [place] who presented with blood clots through her trach.   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 Seen by pulmonology who recommended trilogy ventilator.   -DME order for nocturnal ventilator placed, case management assistance appreciated  -Continue duonebs -Continue dulera -PT/OT recommended SNF, but  patient wishes to return home with aide  Hemoptysis Thought to be multifactorial from mechanical process of changing trach prior to move from [place], also might be due to infectious process.  CT angio 11/29 found right?left posterior lower lobe airspace disease. Treating for aspiration pneumonia with unasyn. Review of patient's records from [place] found similar changes on chest CT few months ago. This   -Continue unasyn Day 3, Abx stop date Dec 5th  HFrEF TTE 11/29 showeing lvef 45-50%, dilated ivc, global hypokinesis, g1dd, elevated lv filling pressure, mild lae. Patient put out -960ml and weight is 456lbs from 452lbs yesterday.   -Bumex 2mg  qd -Continue spirionolactone 100mg  qd -Continue coreg 3/125mg  bid  -Daily I/Os and weights    Dispo: Anticipated discharge in approximately 0-1 day(s).   Lorenso Courierhundi, Ladanian Kelter, MD 05/09/2018, 7:10 AM Pager: [phone]

## 2018-05-09 NOTE — Progress Notes (Addendum)
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 Patient requires the use of NIV both at sleep and in the daytime to help with exacerbation periods. The use of the NIV will treat both the patients high PCO2 levels and can reduce the risk of exacerbations and future hospitalizations when used at night and during the day. The patient will need these advanced settings in conjunction with their current medication regimen; BIPAP is not an option due to its functional limitations and the severity of the patient's condition. Failure to have NIV available for use over a 24 hour period could lead to death.

## 2018-05-10 ENCOUNTER — Ambulatory Visit: Payer: 59 | Admitting: Family Medicine

## 2018-05-10 DIAGNOSIS — R042 Hemoptysis: Secondary | ICD-10-CM

## 2018-05-10 DIAGNOSIS — R0902 Hypoxemia: Secondary | ICD-10-CM

## 2018-05-10 DIAGNOSIS — Z930 Tracheostomy status: Secondary | ICD-10-CM

## 2018-05-10 DIAGNOSIS — G4733 Obstructive sleep apnea (adult) (pediatric): Secondary | ICD-10-CM

## 2018-05-10 LAB — CBC
HCT: 29.9 % — ABNORMAL LOW (ref 36.0–46.0)
Hemoglobin: 8.2 g/dL — ABNORMAL LOW (ref 12.0–15.0)
MCH: 26.3 pg (ref 26.0–34.0)
MCHC: 27.4 g/dL — ABNORMAL LOW (ref 30.0–36.0)
MCV: 95.8 fL (ref 80.0–100.0)
Platelets: 112 10*3/uL — ABNORMAL LOW (ref 150–400)
RBC: 3.12 MIL/uL — ABNORMAL LOW (ref 3.87–5.11)
RDW: 15.2 % (ref 11.5–15.5)
WBC: 6.4 10*3/uL (ref 4.0–10.5)
nRBC: 0 % (ref 0.0–0.2)

## 2018-05-10 NOTE — Care Management Important Message (Signed)
Important Message  Patient Details  Name: Rachael KluverKristy Robertson MRN: [id number] Date of Birth: [birth year]   Medicare Important Message Given:  Yes    [name] [name] 05/10/2018, 4:40 PM

## 2018-05-10 NOTE — Progress Notes (Signed)
Occupational Therapy Treatment Patient Details Name: Rachael KluverKristy Robertson MRN: [id number] DOB: Mar 13, 1951 Today's Date: 05/10/2018    History of present illness pt was admitted for chronic respiratory failure; found to have blood clots in trach when suctioning. She uses vent at night, has PMH:  CKD, PE, COPD, CHF.  She moved to this area a week prior to admission from [place]   OT comments  Pt seen for OT ADL retraining session today with focus on bed mobility, sitting up at EOB for grooming, UB/LB ADL's for bathing/dressing and sit to stand transfers followed by transfering back to bed. Pt requires increased time for tasks and was noted to be incontinent of bowels in standing. She is total A +2 for LB bathing, dressing and toileting/ hygiene. She prefers to go home following hospital stay however she is currently unable to care for herself in her current state of health. Recommend 24/7 Assist at [facility]F Rehab to assist with increased independence for ADL's.   Follow Up Recommendations  SNF;Supervision/Assistance - 24 hour    Equipment Recommendations  None recommended by OT(Has hospital bed/air mattress, shower seat)    Recommendations for Other Services      Precautions / Restrictions Precautions Precautions: Fall Precaution Comments: trach Restrictions Weight Bearing Restrictions: No       Mobility Bed Mobility Overal bed mobility: Needs Assistance Bed Mobility: Supine to Sit;Sit to Supine     Supine to sit: Mod assist;+2 for physical assistance Sit to supine: Mod assist;+2 for physical assistance   General bed mobility comments: assist for trunk for getting up and for bil LEs for back to bed. Pt pulled herself up with bedrails on air mattress in trendelenberg.  Extra time for all activities and to catch breath(Bed inflated to max level per pt request during transfers and bed mobility as pt reports too difficult to perform when not inflated)  Transfers Overall transfer level: Needs  assistance Equipment used: Rolling walker (2 wheeled) Transfers: Sit to/from Stand Sit to Stand: Mod assist;+2 physical assistance;From elevated surface;+2 safety/equipment         General transfer comment: Pt able to take several steps forward and then back following loose BM/incontinence during ADL's. Did not ambulate otherwise. Static standing for ~6810min w/ +2 Min-Mod A while assisted with hygiene.    Balance Overall balance assessment: Needs assistance Sitting-balance support: No upper extremity supported;Feet supported Sitting balance-Leahy Scale: Fair     Standing balance support: Bilateral upper extremity supported Standing balance-Leahy Scale: Poor Standing balance comment: Static standing ~ x10Min during Hygiene following loose BM during ADL's. Heavily reliant on RW and UE support.                            ADL either performed or assessed with clinical judgement   ADL Overall ADL's : Needs assistance/impaired     Grooming: Wash/dry hands;Wash/dry face;Set up;Sitting;Min guard Grooming Details (indicate cue type and reason): Min guard sitting up at EOB Upper Body Bathing: Moderate assistance;Sitting   Lower Body Bathing: Total assistance;Sit to/from stand;+2 for physical assistance   Upper Body Dressing : Moderate assistance;Sitting   Lower Body Dressing: Total assistance;+2 for physical assistance;Sit to/from Database administratorstand   Toilet Transfer: (Not Assessed - pt using purewick but was incontinent of bowels with sit to stand noted.)   Toileting- Clothing Manipulation and Hygiene: Total assistance;+2 for physical assistance;Sit to/from stand Toileting - Clothing Manipulation Details (indicate cue type and reason): See above  Functional mobility during ADLs: Moderate assistance;+2 for physical assistance;+2 for safety/equipment;Rolling walker(Pt able to take several steps forward and backward from EOB after sit to stand for ADL's./hygeine today ) General ADL  Comments: Pt agreeable to OT ADL retraining session today with focus on bed mobility, sitting up at EOB for grooming, UB/LB ADL's and sit to stand transfers followed by transferring back to bed. Pt requires incresaed time for tasks and was noted to be incontinent of bowels in standing. She is total A +2 for LB bathing, dressing and hygiene. She prefers to go home following hospital stay however she is currently unable to care for herself in her current state of health. Recommend 24/7 Assist at [facility]. to assist with increased independence for ADL's.     Vision Baseline Vision/History: Wears glasses Wears Glasses: At all times Patient Visual Report: No change from baseline     Perception     Praxis      Cognition Arousal/Alertness: Awake/alert Behavior During Therapy: WFL for tasks assessed/performed Overall Cognitive Status: Within Functional Limits for tasks assessed                                          Exercises     Shoulder Instructions       General Comments Pt reliant on RW in standing. Wears sneakers w/o tying them - does not like feet to be touched/hypersensitive. Trach collar O2 turned up during activity to 10 L O2 and 8 L at rest.    Pertinent Vitals/ Pain       Pain Assessment: Faces Faces Pain Scale: Hurts a little bit Pain Location: feet hypersensitive; ankles/knees Pain Descriptors / Indicators: Sore Pain Intervention(s): Limited activity within patient's tolerance;Monitored during session  Home Living                                          Prior Functioning/Environment              Frequency  Min 2X/week        Progress Toward Goals  OT Goals(current goals can now be found in the care plan section)  Progress towards OT goals: Progressing toward goals  Acute Rehab OT Goals Patient Stated Goal: Go home  Plan Discharge plan remains appropriate    Co-evaluation                 AM-PAC OT "6  Clicks" Daily Activity     Outcome Measure   Help from another person eating meals?: A Little Help from another person taking care of personal grooming?: A Little Help from another person toileting, which includes using toliet, bedpan, or urinal?: Total Help from another person bathing (including washing, rinsing, drying)?: A Lot Help from another person to put on and taking off regular upper body clothing?: A Lot Help from another person to put on and taking off regular lower body clothing?: Total 6 Click Score: 12    End of Session Equipment Utilized During Treatment: Rolling walker;Other (comment)(O2 Trach collar turned up to 10 L during activty/ADL's, 8 L at rest)  OT Visit Diagnosis: Muscle weakness (generalized) (M62.81)   Activity Tolerance Patient tolerated treatment well   Patient Left in bed;with call bell/phone within reach   Nurse Communication Mobility status;Other (comment)(ADL's completed with  NT)        Time: 1610-9604 OT Time Calculation (min): 72 min  Charges: OT General Charges $OT Visit: 1 Visit OT Treatments $Self Care/Home Management : 68-82 mins    Rachael Robertson Beth Dixon, OTR/L 05/10/2018, 12:12 PM

## 2018-05-10 NOTE — Progress Notes (Signed)
Subjective:  Rachael Robertson is a 67 y.o. with PMH of morbid obesity, chronic trach 2/2 OHS, HFrEF, CKD3, PE on Xarelto admit for hemoptysis on hospital day 3  Rachael Robertson was examined and evaluated at bedside this AM. She states she had some feelings of chest tightness and some shortness of breath but no chest pain overnight. She states she noted some bloody drainage from the tube but appears much improved. She mentions that she has nebulizer at home but does not have ventilator or oxygen or humidifying nebulizer at home because she had been renting those equipments back in [place].  She expressed understanding that we are waiting on these equipments to be delivered prior to discharge. Denies any F/N/V. States she runs usually 'cold' but feels warmer than her usual today.  Objective:  Vital signs in last 24 hours: Vitals:   05/09/18 1953 05/10/18 0035 05/10/18 0745 05/10/18 0809  BP:   (!) 104/48   Pulse: (!) 56 (!) 59 (!) 55 (!) 59  Resp: 20 20 (!) 36 20  Temp:   98.6 F (37 C)   TempSrc:   Oral   SpO2: 97% 97% 98% 98%  Weight:      Height:       Physical Exam  Constitutional: She is oriented to person, place, and time and well-developed, well-nourished, and in no distress. No distress.  Morbidly obese  HENT:  Head: Normocephalic and atraumatic.  Mouth/Throat: Oropharynx is clear and moist.  Eyes: Pupils are equal, round, and reactive to light. Conjunctivae and EOM are normal. No scleral icterus.  Neck: Normal range of motion. Neck supple.  Tracheostomy tube in place with oxygen mask over. No obvious bloody drainage  Cardiovascular: Normal rate, regular rhythm, normal heart sounds and intact distal pulses.  Pulmonary/Chest: Effort normal. No stridor. She has no wheezes. She has no rales.  Distant breath sounds  Abdominal: Soft. Bowel sounds are normal. She exhibits no distension. There is no tenderness.  Musculoskeletal: Normal range of motion. She exhibits edema (non-pitting  edema bilateral lower extremities).  Lymphadenopathy:    She has no cervical adenopathy.  Neurological: She is alert and oriented to person, place, and time. No cranial nerve deficit. GCS score is 15.  Skin: Skin is warm and dry. Rash (R upper extremity rash between skin folds) noted. She is not diaphoretic.   Assessment/Plan:  Active Problems:   Chronic respiratory failure with hypoxia (HCC)  Rachael Robertson is a 67yo F w/ PMH of morbid obesity, chronic trach 2/2 OHS, HFrEF, CKD3, PE on Xarelto admit for hemoptysis and possible pneumonia. She had a CTA showing possible multifocal pneumonia but prior CT from few months ago had similar findings. She has been having no leukocytosis, fever, or productive sputum. Continuing to have hemoptysis but had trach changed ~1 week ago prior to moving from [place].  Hemoptysis 2/2 mechanical trauma from trach change vs pneumonia Hgb 8.2, Wbc 6.4, O2sat 98 on 8L through trach collar - C/w Unasyn (currently day 4 - end after 5 day course treatment on 12/3) - Monitor CBC  Obesity Hypoventilation Syndrome - Appreciate pulm recs: recommended Trilegy ventilator for home - Will need ventilator and oxygen ready at home prior to discharge - Discharge w/ home health aide - C/w duonebs prn for dyspnea - C/w Dulera  HFrEF TTE on 11/29 showing EF 45-50%, I&O net -460 last 24hr - C/w Bumex 2mg  daily - C/w home meds: spironolactone 100mg  daily - C/w carvedilol 3/125mg  BID - Strict I/Os - Daily  weights  DVT prophx: Xarelto Diet: Cardiac Code: Full  Dispo: Anticipated discharge in approximately 0-1 day(s).   Rachael BarrioLee, Joshua K, MD 05/10/2018, 10:22 AM Pager: [phone]

## 2018-05-10 NOTE — Progress Notes (Addendum)
   NAME:  Rachael Robertson, MRN:  [id number], DOB:  08-02-1950, LOS: 3 ADMISSION DATE:  05/07/2018, CONSULTATION DATE: 05/08/2018 REFERRING MD: Heide SparkNarendra, CHIEF COMPLAINT: Obesity hypoventilation, hemoptysis  Brief History   Patient with a history of obesity hypoventilation, chronic nocturnal ventilation History of chronic obstructive pulmonary disease, congestive heart failure History of chronic kidney disease  Past Medical History  History of obesity hypoventilation-has had a trach and nocturnal ventilation for 2 years One episode of PE about 7 years ago-has been on blood thinners since then History of hypercapnic respiratory failure with multiple intubations on ventilator requirement in the past   Significant Hospital Events   Still has occasional bleeding/clots from a trach but less than what it was before  Consults:    Procedures:    Significant Diagnostic Tests:  CT scan of the chest IMPRESSION: 1. The study is nondiagnostic for the evaluation of pulmonary emboli due to patient body habitus. 2. Mild atherosclerotic changes at the aorta. 3. Right greater than left posterior lower lobe airspace disease concerning for pneumonia or aspiration. 4. Diffuse ground-glass attenuation likely reflects mild edema.  Micro Data:  Blood cultures on 05/07/2018-negative so far  Antimicrobials:  Unasyn 11/30>> Levofloxacin 11/29 Zosyn 11/29>>  Interim history/subjective:  Feels better   Objective   Blood pressure (Abnormal) 104/48, pulse 61, temperature 98.6 F (37 C), temperature source Oral, resp. rate 18, height 5' (1.524 m), weight (Abnormal) 206.8 kg, SpO2 93 %.    FiO2 (%):  [28 %-40 %] 28 %   Intake/Output Summary (Last 24 hours) at 05/10/2018 1524 Last data filed at 05/10/2018 0550 Gross per 24 hour  Intake no documentation  Output 450 ml  Net -450 ml   Filed Weights   05/07/18 0807 05/08/18 1110  Weight: (Abnormal) 205 kg (Abnormal) 206.8 kg     Examination: General obese 67 year old white female. Awake no distress HENT # 6 trach prox XLT MMM Pulm decreased t/o Card RRR  abd obese + bowel sounds Ext no sig edema  Neuro intact  Resolved Hospital Problem list   Hemoptysis   Assessment & Plan:   Chronic resp failure & trach dependent in setting of Obesity hypoventilation syndrome and OSA/OSH  Plan Cont BDs Cont oxygen  Routine trach care  Awaiting trilogy at home; we can adjust setting based on downloads F/u our office    Best practice:  Diet: Heart healthy DVT prophylaxis: On anticoagulation with Xarelto Mobility: As tolerated Code Status: Full code Family Communication: No family at bedside Disposition: home once we get all supplies back  Simonne MartinetPeter E Babcock ACNP-BC [facility] Pulmonary/Critical Care Pager # [phone] OR # [phone] if no answer  Attending Note:  67 year old female with obesity hypoventilation syndrome and need for nocturnal ventilation with trach in place.  On exam, coarse BS diffusely.  I reviewed CXR myself, trach is in a good position.  Discussed with PCCM-NP.  OHV/OSA:  - Nocturnal vent  - Awaiting trilogy at home  COPD:  - BD as ordered  Hypoxemia:  - Titrate O2 for sat of 88-92%  Trach status:  - Routine trach care  [facility]  PCCM will continue to follow for trach care.  Patient seen and examined, agree with above note.  I dictated the care and orders written for this patient under my direction.  Alyson ReedyYacoub,  G, MD [phone]

## 2018-05-10 NOTE — Progress Notes (Signed)
  Date: 05/10/2018  Patient name: Rachael Robertson  Medical record number: [id number]  Date of birth: 1950-12-14   I have seen and evaluated this patient and I have discussed the plan of care with the house staff. Please see Dr. Marigene EhlersLee's note for complete details. I concur with his findings with the following additions/corrections:   I agree with plan for antibiotics.  Rachael Robertson reports no subjective findings of pneumonia.  Rachael Robertson did have changes on CT scan, but similar to previous, may be related to bleeding.  Will treat for 5 days and then d/c antibiotics.  If Rachael Robertson has a spike in her temperature, would restart and complete 7-8 days of antibiotics.  Rachael Robertson is awaiting home health supplies prior to a safe discharge.  Rachael Robertson is slowly improving and requires continued stay in the hospital for monitoring.   Rachael Robertson, Rachael Chaisson B, MD 05/10/2018, 3:09 PM

## 2018-05-10 NOTE — Progress Notes (Signed)
HCPOA paper work now in hard chart. Please call POA with any updates regarding patient and d/c plans. Sister:  Leisa LenzCarrie Dickson [phone]. Thank you   Social workerBill RN

## 2018-05-11 LAB — CBC
HCT: 31.8 % — ABNORMAL LOW (ref 36.0–46.0)
Hemoglobin: 8.6 g/dL — ABNORMAL LOW (ref 12.0–15.0)
MCH: 25.5 pg — ABNORMAL LOW (ref 26.0–34.0)
MCHC: 27 g/dL — ABNORMAL LOW (ref 30.0–36.0)
MCV: 94.4 fL (ref 80.0–100.0)
Platelets: 120 10*3/uL — ABNORMAL LOW (ref 150–400)
RBC: 3.37 MIL/uL — ABNORMAL LOW (ref 3.87–5.11)
RDW: 15.1 % (ref 11.5–15.5)
WBC: 7.1 10*3/uL (ref 4.0–10.5)
nRBC: 0.3 % — ABNORMAL HIGH (ref 0.0–0.2)

## 2018-05-11 MED ORDER — PRAMIPEXOLE DIHYDROCHLORIDE 0.25 MG PO TABS
0.5000 mg | ORAL_TABLET | Freq: Once | ORAL | Status: AC
  Administered 2018-05-11: 0.5 mg via ORAL
  Filled 2018-05-11: qty 2

## 2018-05-11 MED ORDER — PRAMIPEXOLE DIHYDROCHLORIDE 0.25 MG PO TABS
1.0000 mg | ORAL_TABLET | Freq: Two times a day (BID) | ORAL | Status: DC
  Administered 2018-05-11 – 2018-05-17 (×12): 1 mg via ORAL
  Filled 2018-05-11 (×12): qty 4

## 2018-05-11 MED ORDER — ONDANSETRON HCL 4 MG/2ML IJ SOLN
4.0000 mg | Freq: Four times a day (QID) | INTRAMUSCULAR | Status: DC | PRN
  Administered 2018-05-11: 4 mg via INTRAVENOUS
  Filled 2018-05-11: qty 2

## 2018-05-11 NOTE — Progress Notes (Signed)
Physical Therapy Treatment Patient Details Name: Rachael Robertson MRN: [id number] DOB: 29-Dec-1950 Today's Date: 05/11/2018    History of Present Illness pt was admitted for chronic respiratory failure; found to have blood clots in trach when suctioning. She uses vent at night, has PMH:  morbid obesity, CKD, PE, COPD, CHF.  She moved to this area a week prior to admission from [place]    PT Comments    Pt with more difficulty with mobility today. Unable to stand with [place]. Pt not known to me prior but cognitively seemed "off" at times. Pt oriented and provided appropriate instruction at times on how to help her but at other times what she was telling us didn't seem to make sense. For example as she was slowly moving her legs toward the EOB I thought she said she wasn't as "strong" today when I ask her about not being as strong she corrected me and said she wasn't "strung S-[id number]-G." I asked her what she meant and she said like "string S-T-R-I-[id number]-G but past tense." I asked her what she was talking about and she didn't answer and began again moving toward the EOB. However she could very clearly give directions and ask appropriate questions about her needs during other parts of the session. We attempted to stand several times and pt was not coming up onto her feet. Returned to supine at this point.    Follow Up Recommendations  SNF(Pt wishes to return home with aide )     Equipment Recommendations  None recommended by PT    Recommendations for Other Services       Precautions / Restrictions Precautions Precautions: Fall Precaution Comments: trach Restrictions Weight Bearing Restrictions: No    Mobility  Bed Mobility Overal bed mobility: Needs Assistance Bed Mobility: Supine to Sit;Sit to Supine;Rolling Rolling: +2 for physical assistance;Max assist   Supine to sit: +2 for physical assistance;Mod assist(+3 for safety) Sit to supine: +2 for physical assistance;Max assist(3rd person)    General bed mobility comments: Pt with extensive time working her legs toward the EOB (15 minutes) and kept pausing to talk about how she was going to do it. Assist to elevate trunk into sitting. Returning to supine required +3 max assist due to pt began lying back posteriorly across the bed and had to bring both legs back up into bed.  Transfers                 General transfer comment: Attempted to stand x 3. Pt kept saying she needed to pull up on someone's arm as an anchor. She then said we were helping too much and then she said we were making her do all the work. Pt began lying down posteriorly across the bed when we gave up trying to stand and returned to supine.  Ambulation/Gait                 Stairs             Wheelchair Mobility    Modified Rankin (Stroke Patients Only)       Balance Overall balance assessment: Needs assistance Sitting-balance support: No upper extremity supported;Feet supported Sitting balance-Leahy Scale: Fair                                      Cognition Arousal/Alertness: Awake/alert Behavior During Therapy: WFL for tasks assessed/performed Overall Cognitive Status: Impaired/Different from baseline  General Comments: Pt not familiar to me but seemed "off". She seemed to get sidetracked when she was trying to explain to us how she usual got up.      Exercises      General Comments        Pertinent Vitals/Pain Pain Assessment: Faces Faces Pain Scale: Hurts a little bit Pain Location: feet hypersensitive; ankles/knees Pain Descriptors / Indicators: Sore Pain Intervention(s): Monitored during session    Home Living                      Prior Function            PT Goals (current goals can now be found in the care plan section) Progress towards PT goals: Not progressing toward goals - comment    Frequency    Min 2X/week      PT Plan  Current plan remains appropriate    Co-evaluation              AM-PAC PT "6 Clicks" Mobility   Outcome Measure  Help needed turning from your back to your side while in a flat bed without using bedrails?: Total Help needed moving from lying on your back to sitting on the side of a flat bed without using bedrails?: Total Help needed moving to and from a bed to a chair (including a wheelchair)?: Total Help needed standing up from a chair using your arms (e.g., wheelchair or bedside chair)?: Total Help needed to walk in hospital room?: Total Help needed climbing 3-5 steps with a railing? : Total 6 Click Score: 6    End of Session Equipment Utilized During Treatment: Oxygen(trach collar 10L) Activity Tolerance: Patient limited by fatigue Patient left: in bed;with call bell/phone within reach Nurse Communication: Mobility status PT Visit Diagnosis: Unsteadiness on feet (R26.81);Muscle weakness (generalized) (M62.81)     Time: 1410-1440 PT Time Calculation (min) (ACUTE ONLY): 30 min  Charges:  $Therapeutic Activity: 23-37 mins                     [facility] Yifan Auker PT Acute Rehabilitation Services Pager [phone] Office [phone]    Angelina OkCary W [facility] SurgeryMaycok 05/11/2018, 5:06 PM

## 2018-05-11 NOTE — Plan of Care (Signed)
  Problem: Activity: Goal: Risk for activity intolerance will decrease Outcome: Progressing   

## 2018-05-11 NOTE — Progress Notes (Signed)
Subjective:  Rachael Robertson is a 67 y.o. 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 She was observed sleeping comfortably in bed. Upon awakening, she is AAO x3 and able to answer all questions and follow directions. She states she has been experiencing some 'fogginess' and felt lethargic this morning. . Denies any sleep disturbances but had experienced some new onset nausea overnight treated with Zofran. Denies any vomiting or significantly different discharge from her tracheostomy. Her nurse at bedside agrees that patient appears much less alert than during the weekend. Denies any headache, blurry vision, light-headedness or dizziness. Denies any shortness of breath, productive sputum, worsening cough, fevers or chills.   Discussed with pt about requirements prior to receiving home ventilator including needing someone to be at home 24/7 who is trained to use the ventilator properly. She states her family members have had concerns but at the moment no one is available to perform said duties. Described the need for someone at home in case of emergencies and she states she would reach out to them again to clarify today.  Objective:  Vital signs in last 24 hours: Vitals:   05/11/18 0727 05/11/18 0821 05/11/18 0827 05/11/18 0944  BP: (!) 109/43   136/63  Pulse: 68  68 71  Resp:   20   Temp: 98.1 F (36.7 C)     TempSrc: Oral     SpO2: 98% 97% 97% 97%  Weight:      Height:       Physical Exam  Constitutional: She is well-developed, well-nourished, and in no distress. No distress.  Morbidly obese  HENT:  Head: Normocephalic and atraumatic.  Mouth/Throat: Oropharynx is clear and moist.  Eyes: Pupils are equal, round, and reactive to light. Conjunctivae and EOM are normal. No scleral icterus.  Neck: Normal range of motion. Neck supple.  Tracheostomy tube in  place with oxygen mask over. No obvious bloody drainage  Cardiovascular: Normal rate, regular rhythm, normal heart sounds and intact distal pulses.  Pulmonary/Chest: Effort normal. No stridor. She has no wheezes. She has no rales.  Distant breath sounds  Abdominal: Soft. Bowel sounds are normal. She exhibits no distension. There is no tenderness.  Musculoskeletal: Normal range of motion. She exhibits edema (non-pitting edema bilateral lower extremities).  Lymphadenopathy:    She has no cervical adenopathy.  Neurological:  Neurologic exam: Mental status: A&Ox3, Appeared somnolent initially but appear to awaken more during exam Cranial Nerves: II: PERRL III, IV, VI: Extra-occular motions intact bilaterally V, VII: Face symmetric, sensation intact in all 3 divisions  VIII: hearing normal to rubbing fingers bilaterally  IX, X: palate rises symmetrically XI: Head turn and shoulder shrug normal bilaterally  XII: tongue midline  Motor: Strength 5/5 on all upper and lower extremities, bulk muscle and tone are normal Gait:Unable to assess Sensory: Light touch intact and symmetric bilaterally  Psychiatric: Normal mood and affect  Skin: Skin is warm and dry. Rash (R upper extremity rash between skin folds) noted. She is not diaphoretic.   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 She is complaining of some nausea  overnight and somnolence this morning. However when she was examined she was just awakened from deep sleep and may have been waking up. Her oxygenation is stable and no evidence of infectious or neurological etiology. Possibly polypharmacy induced  drowsiness and nausea. We will discontinue possible contributing medications that are not vital to her care.  Somnolence 2/2 polypharmacy vs OSA vs metabolic encephalopathy O2 sat 98. Afebrile. BP stable at 109/43. Wbc 7.1 Hgb 8.6 Appear sleepier than prior but may be medication induced. - D/c pramipexole, zofran  - We will discontinue unasyn now as opposed to later today - Will reassess in PM  Hemoptysis 2/2 mechanical trauma from trach change - Currently resolved - Monitor CBC  Obesity Hypoventilation Syndrome Trach team on board. Awaiting home ventilator prior to discharge. Stable oxygenation during hospitalization  - Will need 24/7 caregiver familiar with vent at home before DME will send it out - Will F/u after patient had a chance to contact family - C/w Duonebs prn for dyspnea - C/w Dulera 2 puffs BID  HFrEF (TTE on 11/29 showing EF 45-50%) Stable BP 106/45. Appear euvolemic on exam altho difficult to assess 2/2 body habitus. -650cc output yesterday - C/w Bumex 2mg  daily, spironolactone 100mg  daily - C/w carvedilol 3.125mg  BID (with parameters for low systolic bp, bradycardia) - Strict I/Os - Daily weights  DVT prophx: Xarelto Diet: Cardiac Code: Full  Dispo: Anticipated discharge in approximately 1-2 day(s).   Rachael Robertson, Rachael Gavina K, MD 05/11/2018, 11:09 AM Pager: [phone]

## 2018-05-11 NOTE — Care Management Note (Addendum)
Case Management Note  Patient Details  Name: Otila KluverKristy Mcparland MRN: [id number] Date of Birth: 12/13/1950  Subjective/Objective:     NCM asked patient if it was ok to contact her sister the POA, she states yes.  NCM contacted Leisa Lenzarrie Dickson, she states patient knows how to do her own trach care and she has a care giver with [facility] from 7 am to 9am and 7 pm to 9 pm Mon - Fri , her name is Daphene JaegerFaty- she will be getting training with vent, she also has a care giver on Sat and Sunday for 2 hrs her name is Bjorn LoserRhonda.  Aggie Cosierheresa is Engineer, sitethe manager at [facility] her phone is [phone]. Carrie's daughter , Artist PaisShawna,  who is also the patient's niece will also get training with the vent, she is a stay at home mom, her phone is [phone].  Patient states she uses [facility] [phone] . She will need them to transport her home at discharge.  She will need oxygen also, we will need an oxygen sat room air at rest. Patient has portable suction at home and a neb machine at home.  Lyla SonCarrie will bring patient's w/chair to the hospital so that she can be transported by [facility] with oxygen tank.  The internal medicine clinic will have patient follow up with them for primary care. NCM informed the RN to let patient do her own trach care, she has been doing this her self.   12/5 Letha Capeeborah Shaney Deckman RN ,BSN - NCM [facility], he states the patient and the caregiver will receive vent training on 12/6 at 4:30 pm. Then we can plan to dc patient on Monday because [facility] do not do home vent set up on the weekends, they will coordinate this with the patient and caregiver.    12/6 Letha Capeeborah Patrizia Paule RN, BSN- Patient's niece will be spending the night at the hospital tonight to get training on the vent. Plan for dc on Monday.             Action/Plan: NCM will follow for transition of care needs.   Expected Discharge Date:                  Expected Discharge Plan:  Home w Home  Health Services  In-House Referral:     Discharge planning Services  CM Consult  Post Acute Care Choice:    Choice offered to:     DME Arranged:  N[facility] DME Agency:  [facility]  [facility]    [facility]:     Status of Service:  In process, will continue to follow  If discussed at Long Length of Stay Meetings, dates discussed:    Additional Comments:  Leone Havenaylor, Kamariah Fruchter Clinton, RN 05/11/2018, 3:51 PM

## 2018-05-11 NOTE — Progress Notes (Signed)
  Date: 05/11/2018  Patient name: Rachael Robertson  Medical record number: [id number]  Date of birth: January 21, 1951   I have seen and evaluated this patient and I have discussed the plan of care with the house staff. Please see Dr. Marigene EhlersLee's note for complete details. I concur with his findings.   Inez CatalinaMullen, Emily B, MD 05/11/2018, 1:29 PM

## 2018-05-11 NOTE — Progress Notes (Signed)
RT notified of pt desating into the 70s while on 10L/60% via Trach Mask.  Pt awakened/stimulated and sats began to increase.  Fio2 increased to 98%, Spo2 increased to 97-98%.  Denies sob/difficulty breathing at this time.  Denies the need to be suctioned at this time.  Fio2 set at 10L/80% and pt is currently maintaining sats.  Will cont to monitor.

## 2018-05-12 ENCOUNTER — Inpatient Hospital Stay (HOSPITAL_COMMUNITY): Payer: Medicare Other

## 2018-05-12 DIAGNOSIS — R5383 Other fatigue: Secondary | ICD-10-CM

## 2018-05-12 LAB — BLOOD GAS, VENOUS
Acid-Base Excess: 16.3 mmol/L — ABNORMAL HIGH (ref 0.0–2.0)
Bicarbonate: 43.9 mmol/L — ABNORMAL HIGH (ref 20.0–28.0)
FIO2: 40
O2 SAT: 75.3 %
PO2 VEN: 40.2 mmHg (ref 32.0–45.0)
Patient temperature: 98.6
pCO2, Ven: 103 mmHg (ref 44.0–60.0)
pH, Ven: 7.254 (ref 7.250–7.430)

## 2018-05-12 LAB — BASIC METABOLIC PANEL
Anion gap: 11 (ref 5–15)
Anion gap: 9 (ref 5–15)
BUN: 36 mg/dL — ABNORMAL HIGH (ref 8–23)
BUN: 39 mg/dL — ABNORMAL HIGH (ref 8–23)
CO2: 39 mmol/L — ABNORMAL HIGH (ref 22–32)
CO2: 40 mmol/L — AB (ref 22–32)
Calcium: 8.4 mg/dL — ABNORMAL LOW (ref 8.9–10.3)
Calcium: 8.5 mg/dL — ABNORMAL LOW (ref 8.9–10.3)
Chloride: 91 mmol/L — ABNORMAL LOW (ref 98–111)
Chloride: 92 mmol/L — ABNORMAL LOW (ref 98–111)
Creatinine, Ser: 1.6 mg/dL — ABNORMAL HIGH (ref 0.44–1.00)
Creatinine, Ser: 1.6 mg/dL — ABNORMAL HIGH (ref 0.44–1.00)
GFR calc Af Amer: 39 mL/min — ABNORMAL LOW (ref >60)
GFR calc Af Amer: 39 mL/min — ABNORMAL LOW (ref >60)
GFR calc non Af Amer: 33 mL/min — ABNORMAL LOW (ref >60)
GFR calc non Af Amer: 33 mL/min — ABNORMAL LOW (ref >60)
Glucose, Bld: 105 mg/dL — ABNORMAL HIGH (ref 70–99)
Glucose, Bld: 128 mg/dL — ABNORMAL HIGH (ref 70–99)
Potassium: 5.6 mmol/L — ABNORMAL HIGH (ref 3.5–5.1)
Potassium: 5.2 mmol/L — ABNORMAL HIGH (ref 3.5–5.1)
Sodium: 141 mmol/L (ref 135–145)
Sodium: 141 mmol/L (ref 135–145)

## 2018-05-12 LAB — URINALYSIS, ROUTINE W REFLEX MICROSCOPIC
Bilirubin Urine: NEGATIVE
Glucose, UA: NEGATIVE mg/dL
Ketones, ur: NEGATIVE mg/dL
Nitrite: NEGATIVE
Protein, ur: NEGATIVE mg/dL
Specific Gravity, Urine: 1.02 (ref 1.005–1.030)
pH: 6 (ref 5.0–8.0)

## 2018-05-12 LAB — CULTURE, BLOOD (ROUTINE X 2)
Culture: NO GROWTH
Culture: NO GROWTH

## 2018-05-12 LAB — CBC
HCT: 32.1 % — ABNORMAL LOW (ref 36.0–46.0)
Hemoglobin: 8.4 g/dL — ABNORMAL LOW (ref 12.0–15.0)
MCH: 25.9 pg — ABNORMAL LOW (ref 26.0–34.0)
MCHC: 26.2 g/dL — ABNORMAL LOW (ref 30.0–36.0)
MCV: 99.1 fL (ref 80.0–100.0)
Platelets: 99 10*3/uL — ABNORMAL LOW (ref 150–400)
RBC: 3.24 MIL/uL — ABNORMAL LOW (ref 3.87–5.11)
RDW: 14.9 % (ref 11.5–15.5)
WBC: 7.5 10*3/uL (ref 4.0–10.5)
nRBC: 0.8 % — ABNORMAL HIGH (ref 0.0–0.2)

## 2018-05-12 LAB — POTASSIUM: Potassium: 5.7 mmol/L — ABNORMAL HIGH (ref 3.5–5.1)

## 2018-05-12 MED ORDER — SODIUM POLYSTYRENE SULFONATE 15 GM/60ML PO SUSP
30.0000 g | Freq: Once | ORAL | Status: DC
  Administered 2018-05-12: 30 g via ORAL
  Filled 2018-05-12: qty 120

## 2018-05-12 MED ORDER — SODIUM POLYSTYRENE SULFONATE 15 GM/60ML PO SUSP
30.0000 g | Freq: Once | ORAL | Status: AC
  Filled 2018-05-12: qty 120

## 2018-05-12 MED ORDER — SODIUM CHLORIDE 0.9 % IV SOLN
INTRAVENOUS | Status: AC
  Administered 2018-05-12: 14:00:00 via INTRAVENOUS

## 2018-05-12 NOTE — Progress Notes (Signed)
Received a page from nurse that patient was complaining of "not feeling well."   Subjective:  I went to examine patient and she reported of "feeling weird." She further went on to say that "I feel like I am retaining CO2." She denies any prior history or knowledge of hypercapnia. She did complain of headaches that did not respond to tylenol. Per the nurse, patient has had similar headaches in the morning. Patient reports that she has a BiPAP at home that she usually uses at night but has not had it available here at the hospital.   Physical exam:  -Alert and oriented to place, time and person,  -Pupils equal, round and reactive to light,  -Lungs were clear to auscultation -No evidence of neurological deficits.   Plan: There is no baseline ABG/VBG. BMP reveals elevated Bicarb which may be indicative of metabolic compensation for chronic hypercapnia.  -F/u VBG -Consult respiratory therapy to inquire if we could obtain a nighttime BiPAP via the trach tube.

## 2018-05-12 NOTE — Progress Notes (Signed)
Subjective:  Rachael Robertson is a 67 y.o. 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 She states she continues to feel 'foggy' and is having trouble staying awake. She mentions that she believes her somnolence and lethargy may be due to CO2 retention. She also mentions that she does not have respiratory distress but she feels like 'she has too much breath, the opposite of being out of breath.' She was able to tolerate her meals and is continuing to endorse good PO intake. Denies any F/N/V/D/C. Denies any chest pain, palpitations, numbness, tingling, shortness of breath, productive sputum.  Objective:  Vital signs in last 24 hours: Vitals:   05/12/18 0012 05/12/18 0413 05/12/18 0751 05/12/18 0840  BP: (!) 134/57   (!) 103/48  Pulse: (!) 56   (!) 54  Resp: 19     Temp: 98.3 F (36.8 C)   (!) 97.4 F (36.3 C)  TempSrc: Oral   Oral  SpO2: 99% 96% 94% 92%  Weight:      Height:       Physical Exam  Constitutional: She is oriented to person, place, and time and well-developed, well-nourished, and in no distress. No distress.  Morbidly obese  HENT:  Head: Normocephalic and atraumatic.  Mouth/Throat: Oropharynx is clear and moist.  Eyes: Pupils are equal, round, and reactive to light. Conjunctivae and EOM are normal. No scleral icterus.  Neck: Normal range of motion. Neck supple.  Tracheostomy tube in place with oxygen mask over. Bloody drainage from tube when nurse suctioned at bedside.  Cardiovascular: Normal rate, regular rhythm, normal heart sounds and intact distal pulses.  Pulmonary/Chest: Effort normal. No stridor. She has wheezes (faint wheezes). She has no rales.  Distant breath sounds  Abdominal: Soft. Bowel sounds are normal. She exhibits no distension. There is no tenderness.  Musculoskeletal: Normal range of motion. She exhibits edema  (non-pitting edema bilateral lower extremities).  Lymphadenopathy:    She has no cervical adenopathy.  Neurological: She is alert and oriented to person, place, and time. No cranial nerve deficit. GCS score is 15.  Somnolent, falling asleep while speaking but able to follow directions and answer appropriately  Skin: Skin is warm and dry. She is not diaphoretic.  Psychiatric: Mood, memory, affect and judgment normal.   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 She is continuing to endorse altered mental status with significantly increased somnolence and lethargy. Speaking with patient's family members this morning confirmed that this is unusual for her. Also developed hyperkalemia and aki this AM. Will discontinue her diuretics for her AKI and perform additional work up for possible infectious etiology for her altered mental status.  Somnolence 2/2 polypharmacy vs infection vs dehydration O2 sat 93. Afebrile. BP stable at 103/48. Wbc 7.5 Hgb 8.4 Continuing to endorse somnolence and lethargy which has been confirmed as altered from baseline by both family and nursing staff.  - Chest X-ray - UA - 250cc NS @ 50/hr over 5 hours  Hyperkalemia 5.6 this AM. Had been ~4.5 all during admission - Hold spironolactone (K-sparing diuretic) - Repeat potassium  - Kayexelate 30mg   if confirmed hyperkalemia - Trend BMPs  Acute on Chronic Kidney Disease 2/2 diuretic use vs dehydration Baseline creatinine 1.4. Today creatinine 1.6 - Gentle hydration as stated above - Holding Bumex, spirinolactone  Hemoptysis 2/2 mechanical trauma from tach change & continuing bleed in setting of NOAC use Her drain resumed suctioning bloody discharge with possible clumps.  Patient currently on Xarelto - Monitor CBC  Obesity Hypoventilation Syndrome Respiratory on board. Awaiting home ventilator prior to discharge. Oxygenating 88-95 today - Will need 24/7 caregiver familiar with vent at home before DME will send it out - Niece willing to be trained on vent setting and be with patient at home - [facility] requesting to assess pt in person prior to approving home vent - C/w Duonebs prn for dyspnea - C/w Dulera 2 puffs BID  HFrEF (TTE on 11/29 showing EF 45-50%) Stable BP at 103/48. Does not appear fluid overloaded. Holding diuretics in setting of AKI - C/w carvedilol 3.125mg  BID (with parameters for low systolic bp, bradycardia) - Strict I/Os - Daily weights  DVT prophx: Xarelto Diet: Cardiac Code: Full  Dispo: Anticipated discharge in approximately 1-2 day(s).   Rachael Robertson,  K, MD 05/12/2018, 11:25 AM Pager: [phone]

## 2018-05-12 NOTE — Progress Notes (Signed)
Pt refused coreg and xarelto tonight stating " I'm having a reaction to something, and I don't know what"

## 2018-05-12 NOTE — Progress Notes (Signed)
  Date: 05/12/2018  Patient name: Rachael Robertson  Medical record number: [id number]  Date of birth: 1950/07/02   I have seen and evaluated this patient and I have discussed the plan of care with the house staff. Please see Dr. Marigene EhlersLee's note for complete details. I concur with his findings with the following additions/corrections:   [name] has had episodes of grogginess, mild confusion and somnolence in the mornings for the past 2 days.  This seems to be out of the norm for her.  She has also had a rise in her Cr and K.  Her diuretics will be held today and we will do some investigation into possible infectious sources.  Also a very low rate of fluids will be given to hopefully avoid volume overload.   Rachael Robertson, Emily B, MD 05/12/2018, 2:36 PM

## 2018-05-12 NOTE — Progress Notes (Signed)
Increased to 40%

## 2018-05-12 NOTE — Progress Notes (Signed)
Occupational Therapy Treatment Patient Details Name: Rachael Robertson MRN: [id number] DOB: 1950/10/02 Today's Date: 05/12/2018    History of present illness 67 yo female admitted for chronic respiratory failure; found to have blood clots in trach when suctioning. She uses vent at night, has PMH:  morbid obesity, CKD, PE, COPD, CHF.  She moved to this area a week prior to admission from [place]   OT comments  Pt presenting with lethargy and decrease cognition compared to prior OT session. Pt reporting "I feel drunk", "I don't feel right", and "I have a headache." Pt presenting with decreased attention, following commands, problem solving, sequencing, and awareness. Pt able to attempt bed mobility towards EOB but required Max-Total A +2/3 and significant time and cues. Pt requiring Total A for toilet hygiene with urine incontinence. RN present throughout and agreeing that pt presents with decreased cognition. Continue to recommend dc to SNF and will continue to follow acutely as admitted.    Follow Up Recommendations  SNF;Supervision/Assistance - 24 hour    Equipment Recommendations  None recommended by OT    Recommendations for Other Services PT consult    Precautions / Restrictions Precautions Precautions: Fall Precaution Comments: trach Restrictions Weight Bearing Restrictions: No       Mobility Bed Mobility Overal bed mobility: Needs Assistance Bed Mobility: Rolling;Supine to Sit;Sit to Supine Rolling: Max assist;+2 for physical assistance   Supine to sit: Max assist;+2 for physical assistance;Total assist(+3 for safety) Sit to supine: +2 for physical assistance;Max assist(3rd person)   General bed mobility comments: Pt requiring significant amount of time and cues. Difficulty sequencing. Able to get to near long sitting close to EOB, but not safe to completely reach EOB for upright posture. Pt returning to supine with Max A +3. Pt reuqiring Max A +2 for rolling during  cleaning.  Transfers                 General transfer comment: Not safe and defered    Balance Overall balance assessment: Needs assistance Sitting-balance support: No upper extremity supported;Feet supported Sitting balance-Leahy Scale: Fair                                     ADL either performed or assessed with clinical judgement   ADL Overall ADL's : Needs assistance/impaired                             Toileting- Clothing Manipulation and Hygiene: Total assistance;+2 for physical assistance;Sit to/from stand Toileting - Clothing Manipulation Details (indicate cue type and reason): Total A for toilet hygiene at bed level after urinary incontience.        General ADL Comments: Upon arrival, pt supine in bed and lethargic. Pt reporting she feels "funny" or "drunk" and has diffculty following simple commands. Pt with tangiental thoughts.      Vision       Perception     Praxis      Cognition Arousal/Alertness: Awake/alert Behavior During Therapy: WFL for tasks assessed/performed Overall Cognitive Status: Impaired/Different from baseline Area of Impairment: Attention;Memory;Following commands;Safety/judgement;Awareness;Problem solving                   Current Attention Level: Sustained Memory: Decreased short-term memory Following Commands: Follows one step commands inconsistently;Follows one step commands with increased time Safety/Judgement: Decreased awareness of safety;Decreased awareness of deficits Awareness: Emergent Problem  Solving: Slow processing;Decreased initiation;Difficulty sequencing;Requires verbal cues;Requires tactile cues General Comments: Per RN (who has been working with patient for several days now) stating that pt is not at cognitive baseline. Pt reporting "I feel drunk. I have a head ache and I can't think straight." Pt requiring significant amount of time for processing and unable to follow simple  commands consistantly. Pt requiring cues to initate bed mobility and unable to problem solve sequencing for bed mobility but stating "no" whenever assistance is provided.         Exercises     Shoulder Instructions       General Comments RN present throughout session.     Pertinent Vitals/ Pain       Pain Assessment: Faces Faces Pain Scale: Hurts a little bit Pain Location: feet hypersensitive; ankles/knees Pain Descriptors / Indicators: Sore Pain Intervention(s): Monitored during session;Limited activity within patient's tolerance;Repositioned  Home Living                                          Prior Functioning/Environment              Frequency  Min 2X/week        Progress Toward Goals  OT Goals(current goals can now be found in the care plan section)  Progress towards OT goals: Not progressing toward goals - comment(Very lethargic)  Acute Rehab OT Goals Patient Stated Goal: Go home OT Goal Formulation: With patient Time For Goal Achievement: 05/22/18 Potential to Achieve Goals: Good ADL Goals Pt Will Transfer to Toilet: with min guard assist;ambulating;bedside commode Additional ADL Goal #1: pt will perform bed mobility at min guard level in preparation for adls/toilet transfers Additional ADL Goal #2: pt will perform adls with setup/min guard using AE as needed  Plan Discharge plan remains appropriate    Co-evaluation                 AM-PAC OT "6 Clicks" Daily Activity     Outcome Measure   Help from another person eating meals?: A Little Help from another person taking care of personal grooming?: A Little Help from another person toileting, which includes using toliet, bedpan, or urinal?: Total Help from another person bathing (including washing, rinsing, drying)?: A Lot Help from another person to put on and taking off regular upper body clothing?: A Lot Help from another person to put on and taking off regular lower body  clothing?: Total 6 Click Score: 12    End of Session Equipment Utilized During Treatment: Oxygen(O2 Trach collar turned up to 8 L )  OT Visit Diagnosis: Muscle weakness (generalized) (M62.81)   Activity Tolerance Patient limited by lethargy   Patient Left in bed;with call bell/phone within reach;with nursing/sitter in room   Nurse Communication Mobility status        Time: [id number] OT Time Calculation (min): 25 min  Charges: OT General Charges $OT Visit: 1 Visit OT Treatments $Self Care/Home Management : 23-37 mins  Rachael Robertson MSOT, OTR/L Acute Rehab Pager: [phone] Office: [phone]   Theodoro GristCharis M Denea Robertson 05/12/2018, 2:37 PM

## 2018-05-12 NOTE — Progress Notes (Signed)
Spoke to Dr. Chesley MiresBloomfield about VBG results.  Per MD we will continue ATC throughout the night as we are unable to provide BIPAP through cuffless trach.  Patient is supposed to receive trilogy vent tomorrow for home use.

## 2018-05-12 NOTE — Plan of Care (Signed)
  Problem: Activity: Goal: Risk for activity intolerance will decrease 05/12/2018 0805 by Wannetta Senderuffy, Macil Crady J, RN Outcome: Progressing 05/11/2018 1832 by Wannetta Senderuffy, Cherysh Epperly J, RN Outcome: Progressing   Problem: Safety: Goal: Ability to remain free from injury will improve Outcome: Progressing

## 2018-05-12 NOTE — Progress Notes (Addendum)
Decreased fio2 to 30% 

## 2018-05-13 LAB — BASIC METABOLIC PANEL
Anion gap: 11 (ref 5–15)
BUN: 37 mg/dL — ABNORMAL HIGH (ref 8–23)
CO2: 37 mmol/L — ABNORMAL HIGH (ref 22–32)
Calcium: 8.2 mg/dL — ABNORMAL LOW (ref 8.9–10.3)
Chloride: 92 mmol/L — ABNORMAL LOW (ref 98–111)
Creatinine, Ser: 1.31 mg/dL — ABNORMAL HIGH (ref 0.44–1.00)
GFR calc Af Amer: 49 mL/min — ABNORMAL LOW (ref >60)
GFR calc non Af Amer: 43 mL/min — ABNORMAL LOW (ref >60)
Glucose, Bld: 109 mg/dL — ABNORMAL HIGH (ref 70–99)
Potassium: 4.8 mmol/L (ref 3.5–5.1)
Sodium: 140 mmol/L (ref 135–145)

## 2018-05-13 LAB — CBC
HEMATOCRIT: 30.2 % — AB (ref 36.0–46.0)
Hemoglobin: 8.3 g/dL — ABNORMAL LOW (ref 12.0–15.0)
MCH: 26.2 pg (ref 26.0–34.0)
MCHC: 27.5 g/dL — ABNORMAL LOW (ref 30.0–36.0)
MCV: 95.3 fL (ref 80.0–100.0)
NRBC: 0.6 % — AB (ref 0.0–0.2)
Platelets: 93 10*3/uL — ABNORMAL LOW (ref 150–400)
RBC: 3.17 MIL/uL — ABNORMAL LOW (ref 3.87–5.11)
RDW: 14.6 % (ref 11.5–15.5)
WBC: 8.2 10*3/uL (ref 4.0–10.5)

## 2018-05-13 MED ORDER — SPIRONOLACTONE 100 MG PO TABS
100.0000 mg | ORAL_TABLET | Freq: Every day | ORAL | Status: DC
  Administered 2018-05-13 – 2018-05-17 (×5): 100 mg via ORAL
  Filled 2018-05-13: qty 4
  Filled 2018-05-13: qty 1
  Filled 2018-05-13: qty 4
  Filled 2018-05-13 (×3): qty 1
  Filled 2018-05-13 (×2): qty 4
  Filled 2018-05-13: qty 1
  Filled 2018-05-13: qty 4

## 2018-05-13 MED ORDER — BUMETANIDE 2 MG PO TABS
2.0000 mg | ORAL_TABLET | Freq: Every day | ORAL | Status: DC
  Administered 2018-05-13 – 2018-05-15 (×3): 2 mg via ORAL
  Filled 2018-05-13 (×3): qty 1

## 2018-05-13 NOTE — Progress Notes (Signed)
  Date: 05/13/2018  Patient name: Otila KluverKristy Azure  Medical record number: [id number]  Date of birth: 1951/03/24   I have seen and evaluated this patient and I have discussed the plan of care with the house staff. Please see Dr. Marigene EhlersLee's note for complete details. I concur with his findings.  Inez CatalinaMullen, Sophya Vanblarcom B, MD 05/13/2018, 8:46 PM

## 2018-05-13 NOTE — Progress Notes (Signed)
Physical Therapy Treatment Patient Details Name: Robinn Overholt MRN: [id number] DOB: 1951/04/22 Today's Date: 05/13/2018    History of Present Illness 67 yo female admitted for chronic respiratory failure; found to have blood clots in trach when suctioning. She uses vent at night, has PMH:  morbid obesity, CKD, PE, COPD, CHF.  She moved to this area a week prior to admission from [place]    PT Comments    Patient progressing slowly with therapy, at this continues to be high falls risk. Stand pivot transfer from chair to bed with excessive time, instability due to patient c/o of knee pain and weakness with close chair follow and hands on guarding required. Do not believe this patient would be safe alone for long periods of time mobilizing OOB by herself at this time. Cognition improving from prior PT session. VSS 10L trach collar. Requires max A for positioning in bed.       Follow Up Recommendations  SNF(pt requesting to go home)     Equipment Recommendations  None recommended by PT    Recommendations for Other Services       Precautions / Restrictions Precautions Precautions: Fall Precaution Comments: trach Restrictions Weight Bearing Restrictions: No    Mobility  Bed Mobility Overal bed mobility: Needs Assistance   Rolling: Max assist;+2 for physical assistance   Supine to sit: Max assist;+2 for physical assistance;Total assist Sit to supine: +2 for physical assistance;Max assist   General bed mobility comments: max A to assist pt into position in bed.   Transfers Overall transfer level: Needs assistance Equipment used: Rolling walker (2 wheeled) Transfers: Sit to/from [employer] Sit to Stand: +2 physical assistance;+2 safety/equipment;Min assist Stand pivot transfers: Min assist;+2 physical assistance       General transfer comment: Min A to power patient up, close chair follow during trnasfer to safety as patient reports her knees often lock or  buckle. able to transfer slowly with support of RW and guarding with sheet as gait belt  Ambulation/Gait                 Stairs             Wheelchair Mobility    Modified Rankin (Stroke Patients Only)       Balance Overall balance assessment: Needs assistance Sitting-balance support: No upper extremity supported;Feet supported Sitting balance-Leahy Scale: Fair       Standing balance-Leahy Scale: Poor                              Cognition Arousal/Alertness: Awake/alert Behavior During Therapy: WFL for tasks assessed/performed Overall Cognitive Status: Impaired/Different from baseline Area of Impairment: Attention;Memory;Following commands;Safety/judgement;Awareness;Problem solving                   Current Attention Level: Sustained Memory: Decreased short-term memory Following Commands: Follows one step commands inconsistently;Follows one step commands with increased time Safety/Judgement: Decreased awareness of safety;Decreased awareness of deficits Awareness: Emergent Problem Solving: Slow processing;Decreased initiation;Difficulty sequencing;Requires verbal cues;Requires tactile cues General Comments: pt cognition improving per RN       Exercises      General Comments        Pertinent Vitals/Pain Pain Assessment: Faces Faces Pain Scale: Hurts a little bit Pain Location: feet hypersensitive; ankles/knees Pain Descriptors / Indicators: Sore Pain Intervention(s): Limited activity within patient's tolerance;Monitored during session    Home Living  Prior Function            PT Goals (current goals can now be found in the care plan section) Acute Rehab PT Goals Patient Stated Goal: Go home PT Goal Formulation: With patient Potential to Achieve Goals: Good Progress towards PT goals: Progressing toward goals    Frequency    Min 2X/week      PT Plan Current plan remains appropriate     Co-evaluation              AM-PAC PT "6 Clicks" Mobility   Outcome Measure  Help needed turning from your back to your side while in a flat bed without using bedrails?: Total Help needed moving from lying on your back to sitting on the side of a flat bed without using bedrails?: Total Help needed moving to and from a bed to a chair (including a wheelchair)?: Total Help needed standing up from a chair using your arms (e.g., wheelchair or bedside chair)?: Total Help needed to walk in hospital room?: Total Help needed climbing 3-5 steps with a railing? : Total 6 Click Score: 6    End of Session Equipment Utilized During Treatment: Oxygen(trach collar 10L) Activity Tolerance: Patient limited by fatigue Patient left: in bed;with call bell/phone within reach Nurse Communication: Mobility status PT Visit Diagnosis: Unsteadiness on feet (R26.81);Muscle weakness (generalized) (M62.81)     Time: [id number] PT Time Calculation (min) (ACUTE ONLY): 44 min  Charges:  $Therapeutic Activity: 23-37 mins                     Etta GrandchildSean Bren Borys, PT, DPT Acute Rehabilitation Services Pager: [phone] Office: [phone]     Etta GrandchildSean Amarise Lillo 05/13/2018, 5:58 PM

## 2018-05-13 NOTE — Progress Notes (Addendum)
   Subjective:  Rachael KluverKristy Robertson is a 67 y.o. 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 She states she feels much improved today compared to the last two days in terms of her alertness. She states she refused her carvedilol last night as she only takes it as PRN at home. She was informed that her ventilator would be ready tomorrow and is excited to go home soon. She denies any F/N/V.   Objective:  Vital signs in last 24 hours:       Vitals:   05/13/18 0500 05/13/18 0504 05/13/18 0827 05/13/18 0847  BP:   (!) 106/39   Pulse: (!) 56  (!) 53 (!) 56  Resp: 16  (!) 28 20  Temp:   98.6 F (37 C)   TempSrc:   Oral   SpO2: 100%  98% 98%  Weight:  (!) 209.6 kg    Height:        Gen: Morbidly obese, NAD HEENT: NCAT head, hearing intact, EOMI, PERRL, No nasal discharge, MMM, Tracheostomy tube in place with oxygen mask functioning. CV: RRR, S1, S2 normal, No rubs, no murmurs, no gallops Pulm: CTAB, No rales, no wheezes, no dullness to percussion  Abd: Soft, BS+, NTND Extm: ROM intact, Peripheral pulses intact, Chronic non-pitting edema lower extremities Skin: Dry, Warm, normal turgor, erythematous region between skin folds in upper extremities Neuro: AAOx3, More alert compared to yesterday Psych: Normal mood and affect  Assessment/Plan:  Active Problems:   Chronic respiratory failure with hypoxia (HCC)   Hemoptysis   OSA (obstructive sleep apnea)   Tracheostomy status (HCC)   Hypoxemia   Lethargy  Ms.Kiedrowski is a 67yo F w/ PMH of morbid obesity, 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 Her somnolence has improved significantly when evaluated later in the day and suspect she is becoming hypercarbic overnight due to lack of positive pressure. Case manager states her home ventilator will be available for her  tomorrow and this will hopefully improve her sleep and somnolence. Her AKI and hyperkalemia has resolved this am.  Somnolence 2/2 hypercarbic respiratory failure O2 sat 96. Afebrile. VBG yesterday show pH of 7.254, pCO2 of 103, Bicarb 43.9. Most likely retaining Co2 due to OHS which is worse at night. Chest X-ray show vascular congestion - Home ventilator will arrive tomorrow from DME agency - Family will be trained tomorrow - [facility]  Home on Monday - Ventilator use at night over the weekend at discharge on Monday - Resume home diuretics: spironolactone 100mg  daily, bumetanide 2mg  daily  Acute on CKD 2/2 diuretic use Baseline creatinine 1.4. Today creatinine 1.3 - Resolved  Hyperkalemia K yesterday 5.2 K this am 4.8 - Resolved  HFrEF Stable BP @ 138/53. Chest X-ray show pulmonary edema - Resume diuretics as stated above - Daily weights - I/Os  DVT prophx: Xarelto Diet: Cardiac Code: Full  Dispo: Anticipated discharge in approximately 3 day(s).   [name], Jaishaun Mcnab K, MD 05/13/2018, 9:09 AM Pager: [phone]

## 2018-05-14 LAB — BASIC METABOLIC PANEL
Anion gap: 8 (ref 5–15)
BUN: 28 mg/dL — ABNORMAL HIGH (ref 8–23)
CO2: 42 mmol/L — ABNORMAL HIGH (ref 22–32)
CREATININE: 1.23 mg/dL — AB (ref 0.44–1.00)
Calcium: 8.3 mg/dL — ABNORMAL LOW (ref 8.9–10.3)
Chloride: 92 mmol/L — ABNORMAL LOW (ref 98–111)
GFR calc Af Amer: 53 mL/min — ABNORMAL LOW (ref >60)
GFR calc non Af Amer: 46 mL/min — ABNORMAL LOW (ref >60)
Glucose, Bld: 117 mg/dL — ABNORMAL HIGH (ref 70–99)
Potassium: 4 mmol/L (ref 3.5–5.1)
Sodium: 142 mmol/L (ref 135–145)

## 2018-05-14 LAB — MAGNESIUM: Magnesium: 1.8 mg/dL (ref 1.7–2.4)

## 2018-05-14 NOTE — Progress Notes (Addendum)
NAME:  Rachael KluverKristy Robertson, MRN:  [id number], DOB:  02-13-1951, LOS: 7 ADMISSION DATE:  05/07/2018, CONSULTATION DATE: 05/08/2018 REFERRING MD: Heide SparkNarendra, CHIEF COMPLAINT: Obesity hypoventilation, hemoptysis  Brief History   Patient with a history of obesity hypoventilation, chronic nocturnal ventilation History of chronic obstructive pulmonary disease, congestive heart failure History of chronic kidney disease  Past Medical History  History of obesity hypoventilation-has had a trach and nocturnal ventilation for 2 years One episode of PE about 7 years ago-has been on blood thinners since then History of hypercapnic respiratory failure with multiple intubations on ventilator requirement in the past   Significant Hospital Events   Still has occasional bleeding/clots from a trach but less than what it was before  Consults:    Procedures:    Significant Diagnostic Tests:  CT scan of the chest IMPRESSION: 1. The study is nondiagnostic for the evaluation of pulmonary emboli due to patient body habitus. 2. Mild atherosclerotic changes at the aorta. 3. Right greater than left posterior lower lobe airspace disease concerning for pneumonia or aspiration. 4. Diffuse ground-glass attenuation likely reflects mild edema.  Micro Data:  Blood cultures on 05/07/2018-negative so far  Antimicrobials:  Unasyn 11/30>> Levofloxacin 11/29 Zosyn 11/29>>  Interim history/subjective:  Feels better   Objective   Blood pressure (!) 108/46, pulse 60, temperature 98 F (36.7 C), temperature source Oral, resp. rate 18, height 5' (1.524 m), weight (!) 209.6 kg, SpO2 100 %.    FiO2 (%):  [28 %-40 %] 40 %   Intake/Output Summary (Last 24 hours) at 05/14/2018 1449 Last data filed at 05/14/2018 1155 Gross per 24 hour  Intake 363 ml  Output 750 ml  Net -387 ml   Filed Weights   05/11/18 0628 05/13/18 0504 05/14/18 0500  Weight: (!) 191.9 kg (!) 209.6 kg (!) 209.6 kg    Examination: General  obese 67 year old white female. Awake no distress. On ATC with PM valve HENT # 6 trach prox XLT MMM, Thick neck Pulm decreased t/o, few wheezes and rhonchi, minimal secretions Card S1, S2, RRR , No RMG abd obese + bowel sounds, ND, NT Ext no sig edema  Neuro intact , MAE x 4, A&O x 3 Resolved Hospital Problem list   Hemoptysis   Assessment & Plan:  OHV/OSA:  - Nocturnal vent  - [facility] will set up Trilogy 05/17/2018 at home              - Family to train 12/6 pm             - Plan to discharge home Monday 12/9             - Resume home diuretics : spironolactone 100 mg daily, bumetanide 2               mg daily.  COPD:  - Continue Mucinex, Duonebs and Dulera             - Saturation goals are 88-92%             - Will need follow up with Anders SimmondsPete Babcock, NP in trach clinic  (Appointment made)             - Will need hospital Follow up with Pulmonary 05/28/2018 at 9 am  (Appointment made)     Hypoxemia:  - Titrate O2 for sat of 88-92%  Trach status:            - Trach in good position, secure  -  Routine trach care  - Appointment with trach clinic made  PCCM will continue to follow for trach care until discharge. Follow up scheduled in office     Best practice:  Diet: Heart healthy DVT prophylaxis: On anticoagulation with Xarelto Mobility: As tolerated Code Status: Full code Family Communication: No family at bedside Disposition: home Monday  Bevelyn Ngo, AGACNP-BC [facility] Pulmonary/Critical Care Medicine Pager # [phone] 05/14/2018 3:09 PM

## 2018-05-14 NOTE — Progress Notes (Signed)
  Date: 05/14/2018  Patient name: Rachael KluverKristy Robertson  Medical record number: [id number]  Date of birth: [birth year]   I have seen and evaluated this patient and I have discussed the plan of care with the house staff. Please see Dr. Marigene EhlersLee's note for complete details. I concur with his findings.  Inez CatalinaMullen, Haifa Hatton B, MD 05/14/2018, 6:57 PM

## 2018-05-14 NOTE — Progress Notes (Signed)
Subjective:  Rachael KluverKristy Robertson is a 67 y.o. 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 She states she was feeling foggy again this morning but states is feeling somewhat better. She refused her carvedilol yesterday due to bradycardia but states she feels fine. She understands that her ventilator will arrive today for training with her family member. Continuing to endorse some bloody discharge from trach with suction but denies any frank bleeding or significant blood clots. She states she would like Purewick for use at home. Denies any F/N/V/D/C.  Objective:  Vital signs in last 24 hours: Vitals:   05/14/18 0821 05/14/18 1129 05/14/18 1622 05/14/18 1642  BP:    (!) 112/43  Pulse: 60 60 66 (!) 57  Resp: 18 18 18 19   Temp:    98 F (36.7 C)  TempSrc:    Oral  SpO2:    100%  Weight:      Height:       Physical Exam  Constitutional: She is oriented to person, place, and time and well-developed, well-nourished, and in no distress. No distress.  HENT:  Head: Normocephalic and atraumatic.  Mouth/Throat: Oropharynx is clear and moist. No oropharyngeal exudate.  Eyes: Pupils are equal, round, and reactive to light. Conjunctivae and EOM are normal. No scleral icterus.  Neck: Normal range of motion. Neck supple.  Cardiovascular: Normal rate, regular rhythm, normal heart sounds and intact distal pulses.  Pulmonary/Chest: Effort normal.  Distant breath sounds. No obvious wheezing or rales  Abdominal: Soft. Bowel sounds are normal.  Musculoskeletal: Normal range of motion. She exhibits edema (non pitting edema bilateral lower extremities with discolored skin).  Neurological: She is alert and oriented to person, place, and time. No cranial nerve deficit. GCS score is 15.  Skin: Skin is warm and dry. She is not diaphoretic.  Psychiatric: Mood, memory, affect and judgment  normal.   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 She continues to be intermittently bradycardic with carvedilol use and has refused it for the last couple days. She states this is how she takes her carvedilol at home on PRN basis. Her somnolence and lethargy appears to resolve later in the day and she has appeared alert when we examine her in late mornings. She will start using her home ventilator in the hospital starting today and this should improve her mental status further.  Somnolence 2/2 hypercarbic respiratory failure O2 sat stable overnight at >95. Afebrile - Resolved  Obesity Hypoventilation Syndrome w/ chronic trach Ventilator scheduled to be brought to hospital today with family being trained on use at 4pm. [facility] will set it up at  Home on Monday - Ventilator use at night over the weekend and discharge on Monday - Resume home diuretics: spironolactone 100mg  daily, bumetanide 2mg  daily  HFrEF Soft bp at 105/37. Chest X-ray from 05/13/18 show pulmonary edema. Weight unchanged @ 209.6kg yesterday.  - C/w spironolactone 100mg  daily, bumetanide 2mg  daily - Daily weights - I/Os - Hold Carvedilol for low HR, low systolic BP  DVT prophx: Xarelto Diet: Cardiac Code: Full  Dispo: Anticipated discharge  in approximately 3 day(s).   Rachael Barrio, MD 05/14/2018, 6:47 PM Pager: [phone]

## 2018-05-15 LAB — BASIC METABOLIC PANEL
Anion gap: 9 (ref 5–15)
BUN: 23 mg/dL (ref 8–23)
CO2: 41 mmol/L — ABNORMAL HIGH (ref 22–32)
Calcium: 8.4 mg/dL — ABNORMAL LOW (ref 8.9–10.3)
Chloride: 90 mmol/L — ABNORMAL LOW (ref 98–111)
Creatinine, Ser: 1.37 mg/dL — ABNORMAL HIGH (ref 0.44–1.00)
GFR calc Af Amer: 47 mL/min — ABNORMAL LOW (ref >60)
GFR calc non Af Amer: 40 mL/min — ABNORMAL LOW (ref >60)
Glucose, Bld: 107 mg/dL — ABNORMAL HIGH (ref 70–99)
POTASSIUM: 3.9 mmol/L (ref 3.5–5.1)
Sodium: 140 mmol/L (ref 135–145)

## 2018-05-15 MED ORDER — BUMETANIDE 2 MG PO TABS
4.0000 mg | ORAL_TABLET | Freq: Every day | ORAL | Status: DC
  Administered 2018-05-16 – 2018-05-17 (×2): 4 mg via ORAL
  Filled 2018-05-15 (×2): qty 2

## 2018-05-15 NOTE — Progress Notes (Signed)
Spoke with patient and niece at this time about placing patient on Triology machine at this time but machine does not have the humidification device  and patient and niece had worries about not having humidification while on the machine. Currently patients choice is to stay on T/C as documented, tolerating well at this time, RCP will continue to monitor.

## 2018-05-15 NOTE — Progress Notes (Signed)
   Subjective:  Rachael Robertson is a 67 y.o. 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 She states she had trouble keeping her ventilator on because it did not have humidification. She also mentions that she was on Bumex 4mg  at home but has only received 2mg  daily while inpatient and she can 'feel her volume status rising' Denies any cough, orthopnea, respiratory distress. Denies any F/N/V/chest pain/palpitations.  Objective:  Vital signs in last 24 hours: Vitals:   05/15/18 0500 05/15/18 0804 05/15/18 0805 05/15/18 0814  BP:    (!) 129/44  Pulse:   (!) 57 (!) 55  Resp:   17   Temp:    98.4 F (36.9 C)  TempSrc:    Oral  SpO2:  98% 98% 100%  Weight: (!) 210.9 kg     Height:       Physical Exam  Constitutional: She is oriented to person, place, and time and well-developed, well-nourished, and in no distress. No distress.  Morbidly obese  HENT:  Head: Normocephalic and atraumatic.  Mouth/Throat: Oropharynx is clear and moist. No oropharyngeal exudate.  Eyes: Pupils are equal, round, and reactive to light. Conjunctivae and EOM are normal. No scleral icterus.  Neck: Normal range of motion. Neck supple.  Cardiovascular: Normal rate, regular rhythm, normal heart sounds and intact distal pulses.  No murmur heard. Pulmonary/Chest: Effort normal. She has no wheezes. She has no rales.  Distant breath sounds  Abdominal: Soft. Bowel sounds are normal.  Musculoskeletal: Normal range of motion. She exhibits edema (non-pitting edema lower extremities with skin discoloration).  Neurological: She is alert and oriented to person, place, and time. No cranial nerve deficit. GCS score is 15.  Skin: Skin is warm and dry. Rash (erythematous rash between skin folds) noted. She is not diaphoretic.   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 She is currently stable for discharge as long as her home ventilator is set up at home to provide positive pressure at night. She is continuing to be borderline bradycardic but tolerating carvedilol for now. She is also beginning to have significant weight gain with current weight at 211kg, most likely due to fluid retention in setting of heart failure and chronic kidney disease. We will attempt to diurese by going back to her home dose of Bumex today.  Obesity Hypoventilation Syndrome w/ chronic trach Family trained on ventilator yesterday but requesting humidification for use at home. - [facility] will set it up at  Home on Monday - Ventilator use at night over the weekend and discharge on Monday  HFrEF  Chest X-ray from 05/13/18 show pulmonary edema. Weight unchanged @ 209.6kg yesterday.  - Increase Bumetanide to 4mg  daily - C/w spironolactone 100mg  daily, carvedilol 3.125mg  BID - Daily weights - I/Os  CKD3 (Baseline creatine 1.2-1.4) 1.37 creatine this AM - Trend BMP  DVT prophx: Xarelto Diet: Cardiac Code: Full  Dispo: Anticipated discharge in approximately 2 day(s).   [name], Jamirah Zelaya K, MD 05/15/2018, 9:03 AM Pager: [phone]

## 2018-05-16 DIAGNOSIS — N183 Chronic kidney disease, stage 3 (moderate): Secondary | ICD-10-CM

## 2018-05-16 DIAGNOSIS — Z9911 Dependence on respirator [ventilator] status: Secondary | ICD-10-CM

## 2018-05-16 DIAGNOSIS — I5020 Unspecified systolic (congestive) heart failure: Secondary | ICD-10-CM

## 2018-05-16 LAB — BASIC METABOLIC PANEL
Anion gap: 12 (ref 5–15)
BUN: 25 mg/dL — AB (ref 8–23)
CO2: 38 mmol/L — ABNORMAL HIGH (ref 22–32)
Calcium: 8.7 mg/dL — ABNORMAL LOW (ref 8.9–10.3)
Chloride: 90 mmol/L — ABNORMAL LOW (ref 98–111)
Creatinine, Ser: 1.38 mg/dL — ABNORMAL HIGH (ref 0.44–1.00)
GFR calc Af Amer: 46 mL/min — ABNORMAL LOW (ref >60)
GFR calc non Af Amer: 40 mL/min — ABNORMAL LOW (ref >60)
GLUCOSE: 113 mg/dL — AB (ref 70–99)
Potassium: 4.4 mmol/L (ref 3.5–5.1)
Sodium: 140 mmol/L (ref 135–145)

## 2018-05-16 MED ORDER — CARVEDILOL 3.125 MG PO TABS
3.1250 mg | ORAL_TABLET | Freq: Every day | ORAL | Status: DC
  Administered 2018-05-17: 3.125 mg via ORAL
  Filled 2018-05-16: qty 1

## 2018-05-16 MED ORDER — OXYCODONE HCL 5 MG PO TABS
5.0000 mg | ORAL_TABLET | Freq: Once | ORAL | Status: AC
  Administered 2018-05-16: 5 mg via ORAL
  Filled 2018-05-16: qty 1

## 2018-05-16 NOTE — Progress Notes (Signed)
   Subjective:   [name] was seen resting comfortably in her bed this morning. She denied having any chest pain or difficulty breathing. She mentioned that she felt like something was stuck in her trach.  Objective:  Vital signs in last 24 hours: Vitals:   05/15/18 2243 05/16/18 0032 05/16/18 0105 05/16/18 0305  BP:  135/70    Pulse:  (!) 57  (!) 59  Resp:  16  19  Temp:  98.2 F (36.8 C)    TempSrc:  Oral    SpO2: 96% 99% 97% 95%  Weight:      Height:       Physical Exam  Constitutional: She appears well-developed and well-nourished. No distress.  Morbidly obese  HENT:  Head: Normocephalic and atraumatic.  Eyes: Conjunctivae are normal.  Cardiovascular: Normal rate, regular rhythm and normal heart sounds.  No murmur heard. Respiratory: Effort normal and breath sounds normal. No respiratory distress. She has no wheezes.  GI: Soft. Bowel sounds are normal. She exhibits no distension. There is no tenderness.  Musculoskeletal: She exhibits no edema.  Neurological: She is alert.  Skin: She is not diaphoretic. There is erythema (right antecubital fossa).  Psychiatric: She has a normal mood and affect. Her behavior is normal. Judgment and thought content normal.   Assessment/Plan:  [name] is a 67 y.o f with morbid obesity, OHS, OSA, HFrEF, CKD3, prior PE fwho presented with hemoptysis from trach site thought to be mechanical vs infecious. Treated with 4 day course of antibiotics.   Obesity Hypoventilation Syndrome Patient started using trilegy home vent yesterday 12/7. She has not had any issues using the vent. The patient's niece has been trained on how to manage the ventilator.   -Continue home trilegy vent prn during day and nightly  -Trach team to assist with suction -Will speak with ventilator company tomorrow regarding humidifier  HFrEF The patient has put out 700ml over the past 24 hrs. Her weight is 210kg today up from 209.   -Continue purewick -Continue  bumex 4mg  qd -Decreased coreg from 3.125mg  bid to qd due to bradycardia  CKD3 Patient's cr=1.38 today at baseline 1.3-1.6. Continue to monitor  Hx of Pulmonary Embolism    -Continue xarelto 10mg  qd  Dispo: Anticipated discharge in approximately 1 day.   Rachael Robertson, Rachael Gaskins, MD 05/16/2018, 6:24 AM Pager: [phone]

## 2018-05-16 NOTE — Progress Notes (Signed)
Pt has has been brady (43-48 bpm) on the monitor for about an hour. I text paged the on-call doctor at [phone] am. Will follow up once I hear back from him.   Received a call back at [phone]. Ordered to hold the Carvedilol, geta full set of vitals and physician is coming to floor to see the pt. This nurse completed the tasks.

## 2018-05-16 NOTE — Progress Notes (Signed)
  Date: 05/16/2018  Patient name: Otila KluverKristy Dreese  Medical record number: [id number]  Date of birth: 1950-11-02   I have seen and evaluated this patient and I have discussed the plan of care with the house staff. Please see their note for complete details. I concur with their findings with the following additions/corrections: anticipate d/c tomorrow.  Burns SpainButcher, Salley Boxley A, MD 05/16/2018, 1:23 PM

## 2018-05-17 ENCOUNTER — Other Ambulatory Visit: Payer: Self-pay | Admitting: Internal Medicine

## 2018-05-17 MED ORDER — RIVAROXABAN 20 MG PO TABS: 20.0000 mg | ORAL_TABLET | Freq: Every day | ORAL | 0 refills | Status: DC

## 2018-05-17 MED ORDER — SILVER SULFADIAZINE 1 % EX CREA: TOPICAL_CREAM | Freq: Every day | CUTANEOUS | 0 refills | Status: DC

## 2018-05-17 NOTE — Progress Notes (Signed)
Patient at 86% on Room Air at rest. [facility]

## 2018-05-17 NOTE — Progress Notes (Signed)
  Date: 05/17/2018  Patient name: Rachael Robertson  Medical record number: [id number]  Date of birth: 11/22/1950   I have seen and evaluated this patient and I have discussed the plan of care with the house staff. Please see Dr. Marguerita Beards note for complete details. I concur with his findings with the following additions/corrections:   Patient will be discharged today.  She is going home with [facility] and it appears, based on review of care management documentation, that all needs are met and addressed for safe discharge.  She is medically ready for discharge with Home Ventilator at bedside.   Sid Falcon, MD 05/17/2018, 1:16 PM

## 2018-05-17 NOTE — Progress Notes (Signed)
   Subjective:  Rachael Robertson is a 67 y.o. with PMH of Chronic trach 2/2 OHS, chronic respiratory failure, morbid obesity, HFrEF, CKD3, PE on Xarelto admit for hemoptysis on hospital day 10  Rachael Robertson was examined and evaluated at bedside this AM. She states she feels great and ready to leave. Her 'fogginess' improves every time she uses her ventilator. She denies any shortness of breath, cheat pain, palpitations, nausea, vomiting. She understands that she will be discharged today. Awaiting call back from niece about possible transportation with her own [facility]ena[facility] Niecevan.  Objective:  Vital signs in last 24 hours: Vitals:   05/17/18 0036 05/17/18 0110 05/17/18 0500 05/17/18 0814  BP: (!) 126/53   (!) 102/46  Pulse: (!) 58 61  (!) 103  Resp: 18 16    Temp: 97.6 F (36.4 C)   98.1 F (36.7 C)  TempSrc: Oral   Oral  SpO2: 100% 98%  100%  Weight:   (!) 206.4 kg   Height:        Gen: Well-developed, Morbidly Obese, NAD HEENT: NCAT head, hearing intact, EOMI, PERRL, No nasal discharge, MMM Neck: Trach in place w/o bloody exudate CV: RRR, S1, S2 normal, No rubs, no murmurs, no gallops Pulm: Distant breath sounds. No obvious wheezing, rales.  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 She tolerated being back on her home dose bumex w/o issues. Discharge today to home.  Obesity Hypoventilation Syndrome w/ chronic trach Ventilator at bedside and functioning. -  Discharge today  HFrEF  - C/w spironolactone 100mg  daily, bumetanide 2mg  daily - Daily weights - I/Os  DVT prophx: Xarelto Diet: Cardiac Code: Full  Dispo: Anticipated discharge in approximately today(s).   [name], Anira Senegal K, MD 05/17/2018, 9:32 AM Pager: [phone]

## 2018-05-17 NOTE — Care Management Note (Addendum)
Case Management Note  Patient Details  Name: Rachael Robertson MRN: [id number] Date of Birth: 1950/07/20  Subjective/Objective:     NCM asked patient if it was ok to contact her sister the POA, she states yes.  NCM contacted [name], she states patient knows how to do her own trach care and she has a care giver with [facility] care from 7 am to 9am and 7 pm to 9 pm Mon - Fri , her name is [name]- she will be getting training with vent, she also has a care giver on Sat and _0 /9 [name] RN, BSN - patient for dc today, NCM offered choice from Medicare .Gove list  for [facility] [place], [place], she chose [facility], referral given to Butch Penny  with [facility].  Patient will plan to go home via ambulance, she will contact niece to see what time she would like her to arrive by ambulance.  She will go home with ambu bag, inter cannulas , trach kit cleaning supplies and home vent.  Patient will dc today, via [facility], address confirmed, and [facility] called for transport.  RN aware.                         Action/Plan: DC home when ready.  Expected Discharge Date:  05/17/18               Expected Discharge Plan:  [place]  In-House Referral:     Discharge planning Services  CM Consult  Post Acute Care Choice:  Durable Medical Equipment, Home Health Choice offered to:  Patient  DME Arranged:  NIV DME Agency:  [place] Arranged:  RN, Disease Management, PT, OT [place] Agency:  [place]  Status of Service:  Completed, signed off  If discussed at [place] of Stay Meetings, dates discussed:    Additional Comments:  [name], RN 05/17/2018, 9:48 AM

## 2018-05-19 ENCOUNTER — Telehealth: Payer: Self-pay

## 2018-05-19 ENCOUNTER — Institutional Professional Consult (permissible substitution): Payer: Self-pay | Admitting: Pulmonary Disease

## 2018-05-19 ENCOUNTER — Ambulatory Visit (INDEPENDENT_AMBULATORY_CARE_PROVIDER_SITE_OTHER): Payer: Medicare Other | Admitting: Pulmonary Disease

## 2018-05-19 ENCOUNTER — Encounter: Payer: Self-pay | Admitting: Pulmonary Disease

## 2018-05-19 VITALS — BP 132/70 | HR 64 | Ht 60.0 in

## 2018-05-19 DIAGNOSIS — J9611 Chronic respiratory failure with hypoxia: Secondary | ICD-10-CM | POA: Diagnosis not present

## 2018-05-19 NOTE — Discharge Instructions (Signed)
Obesity Hypoventilation Syndrome Obesity hypoventilation syndrome (OHS) means that you are not breathing well enough to get air in and out of your lungs efficiently (ventilation). This causes a low oxygen level and a high carbon dioxide level in your blood (hypoventilation). Having too much total body fat (obesity) is a significant risk factor for developing OHS. OHS makes it harder for your heart to pump oxygen-rich blood to your body. It can cause sleep disturbances and make you feel sleepy during the day. Over time, OHS can increase your risk for:  Heart disease.  High blood pressure (hypertension).  Reduced ability to absorb sugar from the bloodstream (insulin resistance).  Heart failure. Over time, OHS weakens your heart and can lead to heart failure.  What are the causes? The exact cause of OHS is not known. Possible causes include:  Pressure on the lungs from excess body weight.  Obesity-related changes in how much air the lungs can hold (lung capacity) and how much they can expand (lung compliance).  Failure of the brain to regulate oxygen and carbon dioxide levels properly.  Chemicals (hormones) produced by excess fat cells interfering with breathing regulation.  A breathing condition in which breathing pauses or becomes shallow during sleep (sleep apnea). This condition can eventually cause the body to ventilate poorly and to hold onto carbon dioxide during the day.  What increases the risk? You may have a greater risk for OHS if you:  Have a BMI of 30 or higher. BMI is an estimate of body fat that is calculated from height and weight. For adults, a BMI of 30 or higher is considered obese.  Are 40?65 years old.  Carry most of your excess weight around your waist.  Experience moderate symptoms of sleep apnea.  What are the signs or symptoms? The most common symptoms of OHS are:  Daytime sleepiness.  Lack of energy.  Shortness of breath.  Morning  headaches.  Sleep apnea.  Trouble concentrating.  Irritability, mood swings, or depression.  Swollen veins in the neck.  Swelling of the legs.  How is this diagnosed? Your health care provider may suspect OHS if you are obese and have poor breathing during the day and at night. Your health care provider will also do a physical exam. You may have tests to:  Measure your BMI.  Measure your blood oxygen level with a sensor placed on your finger (pulse oximetry).  Measure blood oxygen and carbon dioxide in a blood sample.  Measure the amount of red blood cells in a blood sample. OHS causes the number of red blood cells you have to increase (polycythemia).  Check your breathing ability (pulmonary function testing).  Check your breathing ability, breathing patterns, and oxygen level while you sleep (sleep study).  You may also have a chest X-ray to rule out other breathing problems. You may have an electrocardiogram (ECG) and or echocardiogram to check for signs of heart failure. How is this treated? Weight loss is the most important part of treatment for OHS, and it may be the only treatment that you need. Other treatments may include:  Using a device to open your airway while you sleep, such as a continuous positive airway pressure (CPAP) machine that delivers oxygen to your airway through a mask.  Surgery (gastric bypass surgery) to lower your BMI. This may be needed if: ? You are very obese. ? Other treatments have not worked for you. ? Your OHS is very severe and is causing organ damage, such as   heart failure.  Follow these instructions at home: Medicines  Take over-the-counter and prescription medicines only as told by your health care provider.  Ask your health care provider what medicines are safe for you. You may be told to avoid medicines that can impair breathing and make OHS worse, such as sedatives and narcotics. Sleeping habits  If you are prescribed a CPAP  machine, make sure you understand and use the machine as directed.  Try to get 8 hours of sleep every night.  Go to bed at the same time every night, and get up at the same time every day. General instructions  Work with your health care provider to make a diet and exercise plan that helps you reach and maintain a healthy weight.  Eat a healthy diet.  Avoid smoking.  Exercise regularly as told by your health care provider.  During the evening, do not drink caffeine and do not eat heavy meals.  Keep all follow-up visits as told by your health care provider. This is important. Contact a health care provider if:   You experience new or worsening shortness of breath.  You have chest pain.  You have an irregular heartbeat (palpitations).  You have dizziness.  You faint.  You develop a cough.  You have a fever.  You have chest pain when you breathe (pleurisy). This information is not intended to replace advice given to you by your health care provider. Make sure you discuss any questions you have with your health care provider. Document Released: 11/05/2015 Document Revised: 12/14/2015 Document Reviewed: 11/05/2015 Elsevier Interactive Patient Education  2018 Elsevier Inc.  

## 2018-05-19 NOTE — Progress Notes (Signed)
Rachael Robertson    [id number]    [birth year]  Primary Care Physician:Patient, No Pcp Per  Referring Physician: No referring provider defined for this encounter.  Chief complaint:  0 Patient being followed up for hypercapnic respiratory failure, obesity hypoventilation, obstructive sleep apnea  HPI:  She was recently hospitalized-treated for pneumonia She was discharged home on a trilogy vent Has only been home for about 2 days now  I had seen her recently in the hospital for follow-up for hemoptysis Not longer having hemoptysis  She has a history of obesity hypoventilation, hypercapnic respiratory failure, previous history of PE for which she is chronically on anticoagulation-on Xarelto  She had a tracheostomy placed in 2017, she currently has a tracheostomy size 6 Shiley in place History of heart failure  She is on a trach collar during the day She was previously able to ambulate, not so recently she is able to stand May take a step or 2   Outpatient Encounter Medications as of 05/19/2018  Medication Sig  . acetaminophen (TYLENOL) 500 MG tablet Take 500 mg by mouth 2 (two) times daily as needed for mild pain.   Marland Kitchen. albuterol (PROVENTIL HFA;VENTOLIN HFA) 108 (90 Base) MCG/ACT inhaler Inhale 1-2 puffs into the lungs every 6 (six) hours as needed for wheezing or shortness of breath.  . allopurinol (ZYLOPRIM) 300 MG tablet Take 300 mg by mouth daily.  . budesonide-formoterol (SYMBICORT) 160-4.5 MCG/ACT inhaler Inhale 2 puffs into the lungs 2 (two) times daily.  . bumetanide (BUMEX) 2 MG tablet Take 2 mg by mouth daily.   . carvedilol (COREG) 3.125 MG tablet Take 3.125 mg by mouth 2 (two) times daily with a meal.   . cetirizine (ZYRTEC) 10 MG tablet Take 10 mg by mouth daily.  Marland Kitchen. levothyroxine (SYNTHROID, LEVOTHROID) 50 MCG tablet Take 50 mcg by mouth daily before breakfast.  . omeprazole (PRILOSEC) 20 MG capsule Take 20 mg by mouth daily.  . potassium chloride SA  (K-DUR,KLOR-CON) 20 MEQ tablet Take 40 mEq by mouth daily.   . pramipexole (MIRAPEX) 1 MG tablet Take 1 mg by mouth 2 (two) times daily.  . rivaroxaban (XARELTO) 20 MG TABS tablet Take 1 tablet (20 mg total) by mouth daily with supper.  . silver sulfADIAZINE (SILVADENE) 1 % cream Apply topically daily.  Marland Kitchen. spironolactone (ALDACTONE) 100 MG tablet Take 100 mg by mouth daily.   No facility-administered encounter medications on file as of 05/19/2018.     Allergies as of 05/19/2018 - Review Complete 05/19/2018  Allergen Reaction Noted  . Erythromycin  05/03/2018  . Naproxen  05/03/2018  . Sulfa antibiotics  05/03/2018    Past Medical History:  Diagnosis Date  . Obesity   . Opiate use   . Tracheostomy in place [facility](HCC)     Past Surgical History:  Procedure Laterality Date  . Perianal abscess    . TRACHELECTOMY      Family History  Problem Relation Age of Onset  . Aneurysm Mother   . Heart disease Father   . Skin cancer Father   . Sleep apnea Father        TRACHEOSTOMY AS WELL  . Breast cancer Sister     Social History   Socioeconomic History  . Marital status: Divorced    Spouse name: Not on file  . Number of children: Not on file  . Years of education: Not on file  . Highest education level: Not on file  Occupational  History  . Not on file  Social Needs  . Financial resource strain: Not on file  . Food insecurity:    Worry: Not on file    Inability: Not on file  . Transportation needs:    Medical: Not on file    Non-medical: Not on file  Tobacco Use  . Smoking status: Passive Smoke Exposure - Never Smoker  . Smokeless tobacco: Never Used  Substance and Sexual Activity  . Alcohol use: Yes    Comment: 1-2 per year   . Drug use: Not on file  . Sexual activity: Not on file  Lifestyle  . Physical activity:    Days per week: Not on file    Minutes per session: Not on file  . Stress: Not on file  Relationships  . Social connections:    Talks on phone: Not on  file    Gets together: Not on file    Attends religious service: Not on file    Active member of club or organization: Not on file    Attends meetings of clubs or organizations: Not on file    Relationship status: Not on file  . Intimate partner violence:    Fear of current or ex partner: Not on file    Emotionally abused: Not on file    Physically abused: Not on file    Forced sexual activity: Not on file  Other Topics Concern  . Not on file  Social History Narrative  . Not on file    Review of Systems  Constitutional: Positive for fatigue.  HENT: Negative.   Eyes: Negative.   Cardiovascular: Positive for leg swelling.  Gastrointestinal: Negative.   Endocrine: Negative.   Genitourinary: Negative.   Musculoskeletal: Positive for arthralgias, back pain and myalgias.  Skin: Negative.   Allergic/Immunologic: Negative.   All other systems reviewed and are negative.   Vitals:   05/19/18 1602  BP: 132/70  Pulse: 64  SpO2: 99%     Physical Exam  Constitutional: She is oriented to person, place, and time. No distress.  Super obese  HENT:  Head: Normocephalic and atraumatic.  Eyes: Pupils are equal, round, and reactive to light. Right eye exhibits no discharge. Left eye exhibits no discharge.  Neck: Normal range of motion. Neck supple.  Cardiovascular: Normal rate and regular rhythm.  Pulmonary/Chest: Effort normal and breath sounds normal. No stridor. No respiratory distress. She has no wheezes.  Poor air movement based on body habitus  Abdominal: Soft. Bowel sounds are normal.  Musculoskeletal: She exhibits edema.  Lymphadenopathy:    She has no cervical adenopathy.  Neurological: She is alert and oriented to person, place, and time. No cranial nerve deficit.  Skin: Skin is warm and dry. She is not diaphoretic.    Data Reviewed: Hospital records reviewed Radiological data reviewed  Assessment:  Obesity hypoventilation syndrome Hypercapnic respiratory  failure Recent pneumonia next Recent hemoptysis-resolved  Requires trilogy vent  Plan/Recommendations: We will make contact with advanced home  We will try to obtain a download from her machine in a week or 2  Encouraged to follow-up with the trach clinic  Continue physical therapy  We will follow-up with BMPs-to assess bicarb levels Will check an ABG if indicated  I will see her back in the office in about 6 weeks Encouraged to call if any significant concerns     Virl Diamond MD [facility] 05/19/2018, 4:45 PM  CC: No ref. provider found

## 2018-05-19 NOTE — Telephone Encounter (Signed)
Agree with start of hh

## 2018-05-19 NOTE — Telephone Encounter (Signed)
[facility] nurse about home health orders. Please call back.

## 2018-05-19 NOTE — Telephone Encounter (Signed)
Yes agree w/ VO for start of HHN. Thank you

## 2018-05-19 NOTE — Telephone Encounter (Signed)
Rtc, VO to start HHN, eval and treat, will call back w/ specific request. Do you agree w/VO for start of HHN?

## 2018-05-19 NOTE — Telephone Encounter (Signed)
Hi Helen,  I'm not on rotation with the IM teaching service currently. I've forwarded this to Dr. Nedra HaiLee and Dr. Delma Officerhundi.

## 2018-05-19 NOTE — Patient Instructions (Signed)
History of obstructive sleep apnea Chronic respiratory failure  You are currently using a trilogy ventilator We will be in touch with advanced home  I will see you back in the office in about 6 weeks Will obtain a metabolic profile in 4 to 5 weeks-this will give us an idea about where your CO2 may be running If you have symptoms of excessive fatigue despite compliance with the ventilator  We may need to repeat the study sooner  Keep your appointment with the trach clinic Continue physical therapy

## 2018-05-20 ENCOUNTER — Encounter: Payer: Self-pay | Admitting: Internal Medicine

## 2018-05-20 ENCOUNTER — Telehealth: Payer: Self-pay

## 2018-05-20 ENCOUNTER — Ambulatory Visit (INDEPENDENT_AMBULATORY_CARE_PROVIDER_SITE_OTHER): Payer: Medicare Other | Admitting: Internal Medicine

## 2018-05-20 ENCOUNTER — Other Ambulatory Visit: Payer: Self-pay

## 2018-05-20 VITALS — BP 114/56 | HR 64 | Temp 98.0°F | Ht 60.0 in | Wt >= 6400 oz

## 2018-05-20 DIAGNOSIS — R1900 Intra-abdominal and pelvic swelling, mass and lump, unspecified site: Secondary | ICD-10-CM | POA: Diagnosis not present

## 2018-05-20 DIAGNOSIS — J9611 Chronic respiratory failure with hypoxia: Secondary | ICD-10-CM

## 2018-05-20 DIAGNOSIS — R042 Hemoptysis: Secondary | ICD-10-CM | POA: Diagnosis not present

## 2018-05-20 DIAGNOSIS — I5022 Chronic systolic (congestive) heart failure: Secondary | ICD-10-CM | POA: Diagnosis not present

## 2018-05-20 DIAGNOSIS — Z7901 Long term (current) use of anticoagulants: Secondary | ICD-10-CM | POA: Diagnosis not present

## 2018-05-20 DIAGNOSIS — H938X3 Other specified disorders of ear, bilateral: Secondary | ICD-10-CM

## 2018-05-20 DIAGNOSIS — Z930 Tracheostomy status: Secondary | ICD-10-CM

## 2018-05-20 DIAGNOSIS — Z7722 Contact with and (suspected) exposure to environmental tobacco smoke (acute) (chronic): Secondary | ICD-10-CM | POA: Diagnosis not present

## 2018-05-20 DIAGNOSIS — M549 Dorsalgia, unspecified: Secondary | ICD-10-CM

## 2018-05-20 DIAGNOSIS — G8929 Other chronic pain: Secondary | ICD-10-CM | POA: Insufficient documentation

## 2018-05-20 DIAGNOSIS — M159 Polyosteoarthritis, unspecified: Secondary | ICD-10-CM | POA: Diagnosis not present

## 2018-05-20 DIAGNOSIS — I5023 Acute on chronic systolic (congestive) heart failure: Secondary | ICD-10-CM | POA: Insufficient documentation

## 2018-05-20 DIAGNOSIS — M1990 Unspecified osteoarthritis, unspecified site: Secondary | ICD-10-CM | POA: Insufficient documentation

## 2018-05-20 MED ORDER — DICLOFENAC SODIUM 1 % TD GEL: 2.0000 g | Freq: Four times a day (QID) | TRANSDERMAL | 2 refills | Status: AC

## 2018-05-20 MED ORDER — OXYCODONE HCL 5 MG PO CAPS: 5.0000 mg | ORAL_CAPSULE | Freq: Three times a day (TID) | ORAL | 0 refills | Status: AC | PRN

## 2018-05-20 MED ORDER — SILVER SULFADIAZINE 1 % EX CREA: TOPICAL_CREAM | Freq: Every day | CUTANEOUS | 0 refills | Status: DC

## 2018-05-20 NOTE — Assessment & Plan Note (Addendum)
During patient's recent hospital admission she was admitted with respiratory distress and found to have an AKI and hyperkalemia.  Her diuretics were discontinued during her hospital admission but she was told to resume them once she was discharged.  We will check her BMP today to check her renal function and potassium level.  Plan: -Follow-up BMP

## 2018-05-20 NOTE — Assessment & Plan Note (Addendum)
Patient has chronic arthritic pain, namely her left hip bilateral knees and bilateral feet.  She uses Voltaren gel and Tylenol which provide relief.  She stated since her move with the climate and altitude changes she has been experiencing more pain than usual.  She stated in the past her PCP gave her Percocet which she would only use during episodes of severe pain.  She is requesting a short-term supply of this medication for her chronic pain.  If she needs to continue on pain medications we will need to consider referring her to a pain management physician.  Plan: - 5-day prescription of oxycodone IR 5 mg every 8 hours as needed -Referral to physical therapy

## 2018-05-20 NOTE — Progress Notes (Signed)
   CC: Chronic respiratory failure, hemoptysis, HFrEF  HPI:  [name] is a 67 y.o. female with a PMHx listed below presenting to the clinic today for a hospital follow up of hemoptysis, chronic respiratory failure and HFrEF.  For details of today's visit and the status of his chronic medical issues please refer to the assessment and plan.   Past Medical History:  Diagnosis Date  . Obesity   . Opiate use   . Tracheostomy in place [facility](HCC)    Review of Systems:   Review of Systems  Constitutional: Negative for chills and fever.  HENT:       Bilateral ear itchiness and flakiness  Respiratory: Negative for cough, hemoptysis and shortness of breath.   Musculoskeletal: Positive for back pain and joint pain.     Physical Exam:  Vitals:   05/20/18 1325  BP: (!) 114/56  Pulse: 64  Temp: 98 F (36.7 C)  TempSrc: Oral  SpO2: 99%  Weight: (!) 455 lb (206.4 kg)  Height: 5' (1.524 m)    Physical Exam  Constitutional: She is oriented to person, place, and time and well-developed, well-nourished, and in no distress.  Cardiovascular: Normal rate, regular rhythm, normal heart sounds and intact distal pulses.  No murmur heard. Pulmonary/Chest: Effort normal and breath sounds normal. No respiratory distress. She has no wheezes.  Abdominal: Soft. Bowel sounds are normal. She exhibits no distension. There is no abdominal tenderness.  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  Skin: Skin is warm and dry. No erythema.  Psychiatric: Mood, memory, affect and judgment normal.    Assessment & Plan:   See Encounters Tab for problem based charting.  Patient seen with Dr. Heide SparkNarendra

## 2018-05-20 NOTE — Telephone Encounter (Signed)
Jim with [facility] requesting VO for PT. Please call back.  

## 2018-05-20 NOTE — Patient Instructions (Addendum)
Ms. [name]  It was a pleasure meeting you today.  I am glad to see that you are doing well after your hospital admission.  I have sent in a referral for physical therapy as well as your prescriptions for Percocet, Voltaren gel, and silver sulfadiazine.  We are going to check some labs today and I will call you if anything is abnormal.  Can plan to follow-up in 3 months with your PCP or sooner if anything comes up.  For your ear itchiness and flakiness, try one of the over the counter ear wax removal kits. You can also try normal saline or warm water irrigation. If this does not help your symptoms let me know.  Happy holidays!

## 2018-05-20 NOTE — Telephone Encounter (Signed)
VO for PT 2x week for 2 weeks, 1x week for 2 weeks for mobility, safety, strengthening, balance. Do you agree?

## 2018-05-20 NOTE — Assessment & Plan Note (Addendum)
Patient has chronic arthritic pain, namely her left hip bilateral knees and bilateral feet.  She uses Voltaren gel and Tylenol which provide relief.  She stated since her move with the climate and altitude changes she has been experiencing more pain than usual.  She stated in the past her PCP gave her Percocet which she would only use during episodes of severe pain.  She is requesting a short-term supply of this medication for her chronic pain.  If she needs to continue on pain medications we will need to consider referring her to a pain management physician.  Plan: - 5-day prescription of oxycodone IR 5 mg every 8 hours as needed -Referral to physical therapy 

## 2018-05-20 NOTE — Assessment & Plan Note (Addendum)
During patient's recent hospital admission she was admitted in respiratory distress and found to be retaining CO2.  She recently moved from [place] and did not have a ventilator for home use.  She was set up with home ventilator and is following with pulmonology now.  She is using 2- 4 L of supplemental oxygen at home.  She is managing her tracheostomy care herself with no trouble.  Pulmonology is following.  Plan: - Continue to follow with pulmonology

## 2018-05-20 NOTE — Assessment & Plan Note (Signed)
Patient was recently hospitalized from 11/29 to 12/9 due to hemoptysis.  She was having blood clots coming out of her tracheostomy most likely secondary to chronic use of Xarelto and possibly pneumonia.  She reported since she was discharged she has not had any ongoing blood clots from her tracheostomy.  We will check her CBC today to make sure her blood counts are stable.  Plan: -Check CBC

## 2018-05-21 ENCOUNTER — Telehealth: Payer: Self-pay | Admitting: General Practice

## 2018-05-21 LAB — CBC
HEMATOCRIT: 32.4 % — AB (ref 34.0–46.6)
Hemoglobin: 10 g/dL — ABNORMAL LOW (ref 11.1–15.9)
MCH: 27 pg (ref 26.6–33.0)
MCHC: 30.9 g/dL — ABNORMAL LOW (ref 31.5–35.7)
MCV: 88 fL (ref 79–97)
Platelets: 249 10*3/uL (ref 150–450)
RBC: 3.7 x10E6/uL — ABNORMAL LOW (ref 3.77–5.28)
RDW: 14.4 % (ref 12.3–15.4)
WBC: 6.8 10*3/uL (ref 3.4–10.8)

## 2018-05-21 LAB — BMP8+ANION GAP
Anion Gap: 20 mmol/L — ABNORMAL HIGH (ref 10.0–18.0)
BUN/Creatinine Ratio: 25 (ref 12–28)
BUN: 39 mg/dL — AB (ref 8–27)
CALCIUM: 9.3 mg/dL (ref 8.7–10.3)
CHLORIDE: 85 mmol/L — AB (ref 96–106)
CO2: 37 mmol/L — ABNORMAL HIGH (ref 20–29)
Creatinine, Ser: 1.54 mg/dL — ABNORMAL HIGH (ref 0.57–1.00)
GFR calc Af Amer: 41 mL/min/{1.73_m2} — ABNORMAL LOW (ref >59)
GFR calc non Af Amer: 35 mL/min/{1.73_m2} — ABNORMAL LOW (ref >59)
Glucose: 120 mg/dL — ABNORMAL HIGH (ref 65–99)
Potassium: 4.5 mmol/L (ref 3.5–5.2)
Sodium: 142 mmol/L (ref 134–144)

## 2018-05-21 NOTE — Telephone Encounter (Signed)
Doris stated PCP will be Dr Delma Officerhundi - [facility] can be [facility].

## 2018-05-21 NOTE — Telephone Encounter (Signed)
[facility] to [facility] and who's the PCP? I asked Mickle PlumbDoris S to check into this.

## 2018-05-21 NOTE — Progress Notes (Signed)
Internal Medicine Clinic Attending  I saw and evaluated the patient.  I personally confirmed the key portions of the history and exam documented by Dr.  Rehman  and I reviewed pertinent patient test results.  The assessment, diagnosis, and plan were formulated together and I agree with the documentation in the resident's note.  

## 2018-05-21 NOTE — Addendum Note (Signed)
Addended by: Earl LagosNARENDRA, Damika Harmon on: 05/21/2018 02:39 PM   Modules accepted: Level of Service

## 2018-05-21 NOTE — Telephone Encounter (Signed)
[facility]ael [facility] [phone]; verify orders

## 2018-05-21 NOTE — Telephone Encounter (Signed)
She is supposed to be a one time visit just for home health purposes.

## 2018-05-25 NOTE — Telephone Encounter (Signed)
Yes, I agree with this plan. Thanks

## 2018-05-27 ENCOUNTER — Ambulatory Visit (HOSPITAL_COMMUNITY): Payer: Self-pay

## 2018-05-27 NOTE — Progress Notes (Addendum)
@Patient  ID: Rachael Robertson, female    DOB: Oct 28, 1950, 67 y.o.   MRN: [id number]  Chief Complaint  Patient presents with  . Follow-up    HFU: Chronic resp failure     Referring provider: No ref. provider found  HPI:  67 year old female never smoker trach patient followed in our office for chronic respiratory failure with hypercarbia, obesity hypoventilation syndrome, and OSA managed at home on trilogy vent  Chronic trach patient since 2017.  Size 6 Shiley in place.  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  Maintenance: Symbicort 160 Pt of: Dr. Olalere  Recent [facility] Encounters:   05/19/2018-office visit-Dr. Olalere Patient was recently hospitalized and treated for pneumonia and was discharged on home trilogy vent.  Patient is no longer having hemoptysis.  Patient with history of obesity hypoventilation, hypercarbic respiratory failure, and previous PE which she is on chronic anti-coag coagulation and (Xarelto).  Patient has a tracheostomy in place since 2017.  Size 6 Shiley in place.  She is on trach collar during the day. Plan: Obtain download from her machine in a week or 2, continue follow-up with trach clinic, continue physical therapy, follow BMPs to assess bicarb levels and check ABG as indicated, follow-up in 6 weeks  05/28/2018  - Visit   67 year old female patient presenting today for hospital follow-up.  Patient reports she has been feeling much better since hospital but on has been compliant to using her trilogy home vent.  Patient feels that she is had less confusion and less tired episodes (when patient is somnolent during the day) since starting the trilogy event.  Patient does report she is had issues with her humidifier which is causing it to alarm and wake her up multiple times at night but she is remained compliant to using her trilogy vent.  I will check a download today.   Patient has continues to complete physical therapy.  Patient continues to do bed exercises still is unable to ambulate.  Patient reports a physical therapy believes that she needs to have her left knee evaluated by orthopedics she has not followed up with primary care regarding this.  Patient reports that the knee pain has started to improve with physical therapy.  Patient has no scheduled follow-up with PCP.  Patient has been using her flutter valve at home but she does not have an incentive spirometer.   05/28/2018-addendum >>>compliance report from trilogy vent-10 on the last 11 days used.  1 day without use.  Average usage 5 hours and 20 minutes.   Tests:  05/20/2018-BMP plus anion gap-creatinine 1.54, GFR 35, CO2 37 (trending down over the past 2 weeks)  05/12/2018-chest x-ray-cardiomegaly with vascular congestion and perihilar airspace opacities likely edema  05/07/2018-CTA - nondiagnostic for evaluation of PE due to body habitus, right greater than left posterior lower lobe airspace disease concerning for pneumonia or aspiration  05/07/2018-echocardiogram-LV ejection fraction 45 to 50%, mild LVH, grade 1 diastolic dysfunction, elevated LV filling pressure  FENO:  No results found for: NITRICOXIDE  PFT: No flowsheet data found.  Imaging: Ct Angio Chest Pe W And/or Wo Contrast  Result Date: 05/07/2018 CLINICAL DATA:  Shortness of breath. Blood clots an tracheostomy tube. EXAM: CT ANGIOGRAPHY CHEST WITH CONTRAST TECHNIQUE: Multidetector CT imaging of the chest was performed using the standard protocol during bolus administration of intravenous contrast. Multiplanar CT image reconstructions and MIPs were obtained to evaluate the vascular anatomy. CONTRAST:  <MEASUREMENT [id number]  IOPAMIDOL (ISOVUE-370) INJECTION 76% COMPARISON:  One-view chest x-ray 05/07/2018 FINDINGS: Cardiovascular: Heart size is normal. The study is of limited diagnostic utility due to significant artifact from patient  body habitus. This seems to have affected the contrast bolus as well. The densest vessel is the aorta. No proximal pulmonary embolus is present. Lobar and segmental vessels are nondiagnostic. There is no evidence for right heart strain. No significant pericardial effusion is present. Minimal calcifications are present in the distal aortic arch and proximal descending aorta. Mediastinum/Nodes: No significant mediastinal, hilar, or axillary adenopathy is present. Esophagus is within normal limits. Tracheostomy tube is in satisfactory position. No mass lesion is associated. Lungs/Pleura: Is present. Consolidation bilateral lower lobe airspace disease is more prominent on the right. Other scattered areas of ground-glass attenuation are also more prominent on the right without other significant consolidation. Small right pleural effusion is present. Upper Abdomen: Unremarkable. Musculoskeletal: Endplate degenerative changes are present. Vertebral body heights are normal. No focal lytic or blastic lesions are evident. Ribs are within normal limits. Review of the MIP images confirms the above findings. IMPRESSION: 1. The study is nondiagnostic for the evaluation of pulmonary emboli due to patient body habitus. 2. Mild atherosclerotic changes at the aorta. 3. Right greater than left posterior lower lobe airspace disease concerning for pneumonia or aspiration. 4. Diffuse ground-glass attenuation likely reflects mild edema. Electronically Signed   By: Marin Robertshristopher  Mattern M.D.   On: 05/07/2018 07:01   Dg Chest Port 1 View  Result Date: 05/13/2018 CLINICAL DATA:  Lethargic, shortness of breath EXAM: PORTABLE CHEST 1 VIEW COMPARISON:  05/07/2018 FINDINGS: Tracheostomy is unchanged. Cardiomegaly. Vascular congestion. Perihilar airspace opacities are noted, likely edema. No effusions. No acute bony abnormality. IMPRESSION: Cardiomegaly with vascular congestion and perihilar airspace opacities, likely edema. Electronically  Signed   By: Charlett NoseKevin  Dover M.D.   On: 05/13/2018 02:39   Dg Chest Portable 1 View  Result Date: 05/07/2018 CLINICAL DATA:  Shortness of breath. EXAM: PORTABLE CHEST 1 VIEW COMPARISON:  None. FINDINGS: Tracheostomy tube at the thoracic inlet. The heart is enlarged. Aortic atherosclerosis. Scattered atelectasis without confluent airspace disease. No pleural effusion or pneumothorax. No pulmonary edema. Rounded metallic density projecting over the medial right hemidiaphragm is presumably external to the patient. IMPRESSION: 1. Tracheostomy tube at the thoracic inlet. 2. Cardiomegaly with scattered atelectasis. Electronically Signed   By: Narda RutherfordMelanie  Sanford M.D.   On: 05/07/2018 04:42      Specialty Problems      Pulmonary Problems   Chronic respiratory failure with hypoxia (HCC)   Hemoptysis   OSA (obstructive sleep apnea)   Right Lower Lobe PNU     05/07/2018-CTA - nondiagnostic for evaluation of PE due to body habitus, right greater than left posterior lower lobe airspace disease concerning for pneumonia or aspiration         Allergies  Allergen Reactions  . Erythromycin     Rash and Hives  . Naproxen     Makes her congested  . Sulfa Antibiotics     Hives    Immunization History  Administered Date(s) Administered  . Influenza-Unspecified 03/22/2018    Past Medical History:  Diagnosis Date  . Obesity   . Opiate use   . Tracheostomy in place [facility](HCC)     Tobacco History: Social History   Tobacco Use  Smoking Status Passive Smoke Exposure - Never Smoker  Smokeless Tobacco Never Used   Counseling given: Yes  Continue to not smoke  Outpatient Encounter Medications as of  05/28/2018  Medication Sig  . acetaminophen (TYLENOL) 500 MG tablet Take 500 mg by mouth 2 (two) times daily as needed for mild pain.   Marland Kitchen albuterol (PROVENTIL HFA;VENTOLIN HFA) 108 (90 Base) MCG/ACT inhaler Inhale 1-2 puffs into the lungs every 6 (six) hours as needed for wheezing or shortness of breath.   . allopurinol (ZYLOPRIM) 300 MG tablet Take 300 mg by mouth daily.  . budesonide-formoterol (SYMBICORT) 160-4.5 MCG/ACT inhaler Inhale 2 puffs into the lungs 2 (two) times daily.  . bumetanide (BUMEX) 2 MG tablet Take 2 mg by mouth daily.   . carvedilol (COREG) 3.125 MG tablet Take 3.125 mg by mouth 2 (two) times daily with a meal.   . cetirizine (ZYRTEC) 10 MG tablet Take 10 mg by mouth daily.  . diclofenac sodium (VOLTAREN) 1 % GEL Apply 2 g topically 4 (four) times daily.  Marland Kitchen levothyroxine (SYNTHROID, LEVOTHROID) 50 MCG tablet Take 50 mcg by mouth daily before breakfast.  . omeprazole (PRILOSEC) 20 MG capsule Take 20 mg by mouth daily.  . potassium chloride SA (K-DUR,KLOR-CON) 20 MEQ tablet Take 40 mEq by mouth daily.   . pramipexole (MIRAPEX) 1 MG tablet Take 1 mg by mouth 2 (two) times daily.  . rivaroxaban (XARELTO) 20 MG TABS tablet Take 1 tablet (20 mg total) by mouth daily with supper.  . silver sulfADIAZINE (SILVADENE) 1 % cream Apply topically daily.  Marland Kitchen spironolactone (ALDACTONE) 100 MG tablet Take 100 mg by mouth daily.   No facility-administered encounter medications on file as of 05/28/2018.     Review of Systems  Review of Systems  Constitutional: Positive for fatigue. Negative for chills, fever and unexpected weight change.  HENT: Negative for congestion, ear pain, postnasal drip, sinus pressure and sinus pain.   Respiratory: Positive for cough (baseline cough, baseline sputum) and shortness of breath (baseline, with exertion ). Negative for chest tightness and wheezing.   Cardiovascular: Negative for chest pain and palpitations.  Gastrointestinal: Positive for diarrhea (1 episode ). Negative for blood in stool, nausea and vomiting.  Musculoskeletal: Positive for arthralgias and gait problem.       +left knee pain   Skin: Negative for color change.  Allergic/Immunologic: Negative for environmental allergies and food allergies.  Neurological: Negative for dizziness,  light-headedness and headaches.  Psychiatric/Behavioral: Positive for confusion (episodes of brain fog) and sleep disturbance (still sleepy during the day ). Negative for dysphoric mood. The patient is not nervous/anxious.   All other systems reviewed and are negative.    Physical Exam  BP (!) 108/58 (BP Location: Left Wrist, Cuff Size: Normal)   Pulse 60   SpO2 100%   Pt could not get out of wheelchair this am   On 2L via nasal cannula   Wt Readings from Last 5 Encounters:  05/20/18 (!) 455 lb (206.4 kg)  05/17/18 (!) 455 lb (206.4 kg)  05/03/18 (!) 452 lb (205 kg)     Physical Exam  Constitutional: She is oriented to person, place, and time and well-developed, well-nourished, and in no distress. Vital signs are normal. No distress.  + Chronically ill morbidly obese  HENT:  Head: Normocephalic and atraumatic.  Right Ear: Hearing, tympanic membrane, external ear and ear canal normal.  Left Ear: Hearing, tympanic membrane, external ear and ear canal normal.  Nose: Nose normal. Right sinus exhibits no maxillary sinus tenderness and no frontal sinus tenderness. Left sinus exhibits no maxillary sinus tenderness and no frontal sinus tenderness.  Mouth/Throat: Uvula is midline and  oropharynx is clear and moist. No oropharyngeal exudate.  Eyes: Pupils are equal, round, and reactive to light.  Neck: Normal range of motion. Neck supple.    Cardiovascular: Normal rate, regular rhythm and normal heart sounds.  Pulmonary/Chest: Effort normal. No accessory muscle usage. No respiratory distress. She has decreased breath sounds. She has no wheezes. She has no rhonchi.  + Decreased breath sounds throughout entire exam related to body habitus and patient's inability to sit up fully in motorized wheelchair  Musculoskeletal: Normal range of motion.        General: Edema (Chronic lower extremity swelling 2-3+ bilaterally) present.  Neurological: She is alert and oriented to person, place, and  time. Gait normal.  Skin: Skin is warm and dry. She is not diaphoretic. No erythema.  Psychiatric: Mood, memory, affect and judgment normal.  Nursing note and vitals reviewed.     Lab Results:  CBC    Component Value Date/Time   WBC 6.8 05/20/2018 1446   WBC 8.2 05/13/2018 0313   RBC 3.70 (L) 05/20/2018 1446   RBC 3.17 (L) 05/13/2018 0313   HGB 10.0 (L) 05/20/2018 1446   HCT 32.4 (L) 05/20/2018 1446   PLT 249 05/20/2018 1446   MCV 88 05/20/2018 1446   MCH 27.0 05/20/2018 1446   MCH 26.2 05/13/2018 0313   MCHC 30.9 (L) 05/20/2018 1446   MCHC 27.5 (L) 05/13/2018 0313   RDW 14.4 05/20/2018 1446   LYMPHSABS 0.6 (L) 05/07/2018 0500   MONOABS 0.4 05/07/2018 0500   EOSABS 0.2 05/07/2018 0500   BASOSABS 0.0 05/07/2018 0500    BMET    Component Value Date/Time   NA 142 05/20/2018 1446   K 4.5 05/20/2018 1446   CL 85 (L) 05/20/2018 1446   CO2 37 (H) 05/20/2018 1446   GLUCOSE 120 (H) 05/20/2018 1446   GLUCOSE 113 (H) 05/16/2018 0252   BUN 39 (H) 05/20/2018 1446   CREATININE 1.54 (H) 05/20/2018 1446   CALCIUM 9.3 05/20/2018 1446   GFRNONAA 35 (L) 05/20/2018 1446   GFRAA 41 (L) 05/20/2018 1446    BNP    Component Value Date/Time   BNP 252.7 (H) 05/07/2018 0500    ProBNP No results found for: PROBNP    Assessment & Plan:   Pleasant 67 year old female patient presenting today for hospital follow-up.  Patient is doing well since being discharged from the hospital.  Will order CT to follow pneumonia patient to continue on trilogy vent as well as to follow-up with trach clinic as scheduled in January/2020.  Patient continue follow-up with physical therapy.  Will check lab work today to follow CO2 levels which may need arterial blood gas if there no longer trending down.  Will have [facility] follow-up with the patient regarding the humidifier on patient's trilogy vent as well as to provide an incentive spirometer for the patient.  I will check a download of compliance  from [facility].  Patient is to follow-up with primary care regarding fluid overload, chronic management of congestive heart failure and further evaluation of left knee.  Tracheostomy status (HCC) Keep follow-up with trach clinic as scheduled  Chronic respiratory failure with hypoxia (HCC)  Continue trilogy vent at home  Continue trach collar during the day  Lab work today  Follow-up with Dr. Wynona Neat as planned  We will contact [facility] about the humidifier in your trilogy vent as well as your need for an incentive spirometer  Keep follow-up with trach clinic as scheduled  I really recommend that you start weighing yourself daily to track for fluid overload  Chronic systolic heart failure (HCC)  I really recommend that you start weighing yourself daily to track for fluid overload  Hemoptysis Resolved. No episodes. Stable.   Right Lower Lobe PNU  CT without contrast in 3-4 weeks to ensure resolution of pneumonia     05/28/2018-addendum >>>compliance report from trilogy vent-10 on the last 11 days used.  1 day without use.  Average usage 5 hours and 20 minutes.  Coral Ceo, NP 05/28/2018   This appointment was 38 minutes along with over 50% of the time in direct face-to-face patient care, assessment, plan of care, and follow-up.

## 2018-05-28 ENCOUNTER — Other Ambulatory Visit: Payer: Self-pay

## 2018-05-28 ENCOUNTER — Ambulatory Visit (INDEPENDENT_AMBULATORY_CARE_PROVIDER_SITE_OTHER): Payer: Medicare Other | Admitting: Pulmonary Disease

## 2018-05-28 ENCOUNTER — Encounter: Payer: Self-pay | Admitting: Pulmonary Disease

## 2018-05-28 VITALS — BP 108/58 | HR 60

## 2018-05-28 DIAGNOSIS — R042 Hemoptysis: Secondary | ICD-10-CM | POA: Diagnosis not present

## 2018-05-28 DIAGNOSIS — J189 Pneumonia, unspecified organism: Secondary | ICD-10-CM | POA: Insufficient documentation

## 2018-05-28 DIAGNOSIS — J9611 Chronic respiratory failure with hypoxia: Secondary | ICD-10-CM

## 2018-05-28 DIAGNOSIS — J181 Lobar pneumonia, unspecified organism: Secondary | ICD-10-CM

## 2018-05-28 DIAGNOSIS — I5022 Chronic systolic (congestive) heart failure: Secondary | ICD-10-CM

## 2018-05-28 DIAGNOSIS — Z930 Tracheostomy status: Secondary | ICD-10-CM | POA: Diagnosis not present

## 2018-05-28 LAB — COMPREHENSIVE METABOLIC PANEL
ALT: 14 U/L (ref 0–35)
AST: 15 U/L (ref 0–37)
Albumin: 3.7 g/dL (ref 3.5–5.2)
Alkaline Phosphatase: 82 U/L (ref 39–117)
BILIRUBIN TOTAL: 0.5 mg/dL (ref 0.2–1.2)
BUN: 38 mg/dL — ABNORMAL HIGH (ref 6–23)
CO2: 47 mEq/L — ABNORMAL HIGH (ref 19–32)
Calcium: 9.5 mg/dL (ref 8.4–10.5)
Chloride: 87 mEq/L — ABNORMAL LOW (ref 96–112)
Creatinine, Ser: 1.16 mg/dL (ref 0.40–1.20)
GFR: 49.82 mL/min — ABNORMAL LOW (ref >60.00)
Glucose, Bld: 125 mg/dL — ABNORMAL HIGH (ref 70–99)
Potassium: 4.3 mEq/L (ref 3.5–5.1)
Sodium: 139 mEq/L (ref 135–145)
TOTAL PROTEIN: 8.3 g/dL (ref 6.0–8.3)

## 2018-05-28 NOTE — Assessment & Plan Note (Signed)
CT without contrast in 3-4 weeks to ensure resolution of pneumonia

## 2018-05-28 NOTE — Assessment & Plan Note (Signed)
Resolved. No episodes. Stable.

## 2018-05-28 NOTE — Assessment & Plan Note (Signed)
Keep follow-up with trach clinic as scheduled

## 2018-05-28 NOTE — Progress Notes (Signed)
Patient CO2 is trending up I do [facility] that [facility] gas for further evaluation of hypercarbia.  Please place the order and coordinate with respiratory care services to get patient scheduled for ABG.  Please place the order for ABG lauren and route to the PCCs to call to schedule.   Elisha HeadlandBrian Azia Toutant FNP

## 2018-05-28 NOTE — Assessment & Plan Note (Signed)
  Continue trilogy vent at home  Continue trach collar during the day  Lab work today  Follow-up with Dr. Wynona Neatlalere as planned  We will contact [facility] about the humidifier in your trilogy vent as well as your need for an incentive spirometer  Keep follow-up with trach clinic as scheduled  I really recommend that you start weighing yourself daily to track for fluid overload

## 2018-05-28 NOTE — Patient Instructions (Addendum)
CT without contrast in 3-4 weeks to ensure resolution of pneumonia   Continue trilogy vent at home  Continue trach collar during the day  Lab work today  Follow-up with Dr. Wynona Neatlalere as planned  We will contact [facility] about the humidifier in your trilogy vent as well as your need for an incentive spirometer  Keep follow-up with trach clinic as scheduled  Continue follow-up with physical therapy  Follow-up with primary care regarding knee assessment and potential referral to orthopedics >>>schedule appt   I really recommend that you start weighing yourself daily to track for fluid overload    It is flu season:   >>>Remember to be washing your hands regularly, using hand sanitizer, be careful to use around herself with has contact with people who are sick will increase her chances of getting sick yourself. >>> Best ways to protect herself from the flu: Receive the yearly flu vaccine, practice good hand hygiene washing with soap and also using hand sanitizer when available, eat a nutritious meals, get adequate rest, hydrate appropriately   Please contact the office if your symptoms worsen or you have concerns that you are not improving.   Thank you for choosing [facility] for your healthcare, and for allowing us to partner with you on your healthcare journey. I am thankful to be able to provide care to you today.   Elisha HeadlandBrian Chrysa Rampy FNP-C

## 2018-05-28 NOTE — Assessment & Plan Note (Signed)
  I really recommend that you start weighing yourself daily to track for fluid overload

## 2018-05-28 NOTE — Addendum Note (Signed)
Addended by: Sylvester HarderMOORE, Tanieka Pownall R on: 05/28/2018 08:39 AM   Modules accepted: Orders

## 2018-05-31 ENCOUNTER — Telehealth: Payer: Self-pay | Admitting: *Deleted

## 2018-05-31 NOTE — Progress Notes (Signed)
Can we follow-up with the patient as well as [facility] ensure the patient has been scheduled for an arterial blood gas based off of elevated CO2 results.  Elisha HeadlandBrian Mack FNP

## 2018-05-31 NOTE — Telephone Encounter (Signed)
I called Respiratory Therapy this morning and left a vm for them to call pt to schedule.  Delice Bisonara who normally schedules is not there today.  Corrie DandyMary just called me from Respiratory and states she has scheduled pt for 12/30.  Pt told her she would need time to arrange transportation.

## 2018-05-31 NOTE — Telephone Encounter (Signed)
Called and spoke with patient, 10 days was longer than expected for scheduling the ABG.  I let patient know if any worsening symptoms she needed to go straight to the emergency room. Patient currently denies any symptoms at this time.  Nothing further needed at this time.

## 2018-05-31 NOTE — Telephone Encounter (Signed)
-----   Message from Coral CeoBrian P Mack, NP sent at 05/31/2018 10:43 AM EST ----- Can we follow-up with the patient as well as [facility] ensure the patient has been scheduled for an arterial blood gas based off of elevated CO2 results.  Elisha HeadlandBrian Mack FNP

## 2018-05-31 NOTE — Telephone Encounter (Signed)
Was able to call and talk with patient, she has not been scheduled or had her ABG done. [facility] to follow up on this.

## 2018-06-07 ENCOUNTER — Telehealth: Payer: Self-pay | Admitting: Pulmonary Disease

## 2018-06-07 ENCOUNTER — Ambulatory Visit (HOSPITAL_COMMUNITY)
Admission: RE | Admit: 2018-06-07 | Discharge: 2018-06-07 | Disposition: A | Discharge status: Home / Self Care | Payer: Medicare Other | Source: Ambulatory Visit | Attending: Pulmonary Disease | Admitting: Pulmonary Disease

## 2018-06-07 DIAGNOSIS — J9611 Chronic respiratory failure with hypoxia: Secondary | ICD-10-CM | POA: Insufficient documentation

## 2018-06-07 LAB — BLOOD GAS, ARTERIAL
Acid-Base Excess: 17.5 mmol/L — ABNORMAL HIGH (ref 0.0–2.0)
Bicarbonate: 43.7 mmol/L — ABNORMAL HIGH (ref 20.0–28.0)
Drawn by: 205171
O2 Content: 2 L/min
O2 Saturation: 96.4 %
PO2 ART: 77.6 mmHg — AB (ref 83.0–108.0)
Patient temperature: 98.6
pCO2 arterial: 77 mmHg (ref 32.0–48.0)
pH, Arterial: 7.372 (ref 7.350–7.450)

## 2018-06-07 NOTE — Telephone Encounter (Signed)
[facility] to pt's ABG results:  -Ph: 7.37 -CO2: 77 -PO2: 77.6 -Sat: 96.4 -bicarb: 43.7 on 2L  Per Corrie DandyMary, based on the results, she said that pt's CO2 level would make the result be critical.   Sending to Elisha HeadlandBrian Mack,  NP

## 2018-06-07 NOTE — Progress Notes (Signed)
See telephone note from earlier today.  Will need to contact DME company to change volumes as well as respiratory rate and will need to have a repeat arterial blood gas.  Can we contact the patient to update after we have spoken to the DME company?  Elisha HeadlandBrian Mack, FNP

## 2018-06-07 NOTE — Telephone Encounter (Signed)
Discussed case with Dr. Wynona Neatlalere.  [facility].  Patient will need tidal volume increased to 50 - 100 cc.  Patient will also need increase rate by 2.  Patient will need arterial blood gas in 3 to 4 days to ensure CO2 is trending down.  [facility]ME [facility].  Please place the orders. Please let me know if you need help contacting the patient.   Please ensure the patient has follow-up with our office in 4 to 6 weeks or sooner if patient symptoms are worsening.  Elisha HeadlandBrian Britian Jentz FNP

## 2018-06-07 NOTE — Telephone Encounter (Signed)
Please change Triolgy Vent AVAP- Tidal volume-550 Rate-12 Ipap Min-5  Ipap Max-25 Epap min 5 Sensitivity-I Rise-3  4L Bleed in  Please download in two weeks after change is made.   Patient is scheduled  For apt 07/12/18.  I have ordered  an abg .  Patient is aware nothing further needed

## 2018-06-08 NOTE — Progress Notes (Signed)
See telephone note from 06/07/2018.  Trilogy ventilator settings changed.  Patient to get follow-up ABG.  Patient scheduled for follow-up with our office.  Nothing further needed at this time  Elisha HeadlandBrian Mack, FNP

## 2018-06-10 ENCOUNTER — Telehealth: Payer: Self-pay

## 2018-06-10 NOTE — Telephone Encounter (Signed)
Jim with [facility] requesting VO for PT. Please call back.  

## 2018-06-11 ENCOUNTER — Ambulatory Visit (HOSPITAL_COMMUNITY)
Admission: RE | Admit: 2018-06-11 | Discharge: 2018-06-11 | Disposition: A | Discharge status: Home / Self Care | Payer: Medicare Other | Source: Ambulatory Visit | Attending: Pulmonary Disease | Admitting: Pulmonary Disease

## 2018-06-11 DIAGNOSIS — J9611 Chronic respiratory failure with hypoxia: Secondary | ICD-10-CM | POA: Insufficient documentation

## 2018-06-11 LAB — BLOOD GAS, ARTERIAL
Acid-Base Excess: 15.8 mmol/L — ABNORMAL HIGH (ref 0.0–2.0)
Bicarbonate: 41.6 mmol/L — ABNORMAL HIGH (ref 20.0–28.0)
DRAWN BY: 244901
O2 Content: 2 L/min
O2 Saturation: 94.3 %
PH ART: 7.393 (ref 7.350–7.450)
Patient temperature: 98.6
pCO2 arterial: 69.7 mmHg (ref 32.0–48.0)
pO2, Arterial: 69.1 mmHg — ABNORMAL LOW (ref 83.0–108.0)

## 2018-06-11 NOTE — Telephone Encounter (Signed)
Return call to Rosanne Ashing [facility] - no answer; left message to call the office.

## 2018-06-21 ENCOUNTER — Telehealth: Payer: Self-pay | Admitting: Pulmonary Disease

## 2018-06-21 NOTE — Telephone Encounter (Signed)
lmtcb x1 for Jim with [facility] 

## 2018-06-21 NOTE — Telephone Encounter (Signed)
Rosanne Ashing from [facility] is returning the call back. CB is [phone]. Ok to LVM.

## 2018-06-21 NOTE — Telephone Encounter (Signed)
[facility] and [facility] [place] [facility] Rosanne AshingJim states that pt is having trouble with her trach. Rosanne AshingJim was unclear as to what exactly the issue was.  Rosanne AshingJim felt that it would be best if pt contacted trach clinic regarding this matter. I have provided Rosanne AshingJim with trach clinic contact number. Ardelle Ballsdvised Jim to contact our office if anything further was needed on our end.  Nothing further is needed.

## 2018-06-24 ENCOUNTER — Encounter: Payer: Self-pay | Admitting: Internal Medicine

## 2018-06-28 ENCOUNTER — Inpatient Hospital Stay: Admission: RE | Admit: 2018-06-28 | Payer: Medicare Other | Source: Ambulatory Visit

## 2018-06-29 ENCOUNTER — Telehealth: Payer: Self-pay | Admitting: Pulmonary Disease

## 2018-06-29 NOTE — Telephone Encounter (Signed)
Would add Mucinex Twice daily   Unplug trach intermittently  if she plugs trach during daytime . Use trach collar  Make sure she is compliant with Trilogy vent At bedtime  And naps.   If she is having more symptoms will need to be seen for evaluation or go to ER   If she is having Chest pain , need ER evaluation .   Please contact office for sooner follow up if symptoms do not improve or worsen or seek emergency care

## 2018-06-29 NOTE — Telephone Encounter (Signed)
Spoke with Rachael Robertson, she states, she went out to see pt today and noticed pt had developed a cough without mucus production that started early this am. She also has symptoms of chest congestion, wheezing, temp 97.2 and Rachael Robertson was  concerned because she has a trach. Pt also noted chest pain at the top part of her chest. She has not tried any medications for her symptoms. Pt states it is harder to breathe than usual. I offered pt an appt but she refused due to no transportation. TP please advise since AO is not available.   [facility]    Current Outpatient Medications on File Prior to Visit  Medication Sig Dispense Refill  . acetaminophen (TYLENOL) 500 MG tablet Take 500 mg by mouth 2 (two) times daily as needed for mild pain.     Marland Kitchen albuterol (PROVENTIL HFA;VENTOLIN HFA) 108 (90 Base) MCG/ACT inhaler Inhale 1-2 puffs into the lungs every 6 (six) hours as needed for wheezing or shortness of breath.    . allopurinol (ZYLOPRIM) 300 MG tablet Take 300 mg by mouth daily.    . budesonide-formoterol (SYMBICORT) 160-4.5 MCG/ACT inhaler Inhale 2 puffs into the lungs 2 (two) times daily.    . bumetanide (BUMEX) 2 MG tablet Take 2 mg by mouth daily.     . carvedilol (COREG) 3.125 MG tablet Take 3.125 mg by mouth 2 (two) times daily with a meal.     . cetirizine (ZYRTEC) 10 MG tablet Take 10 mg by mouth daily.    . diclofenac sodium (VOLTAREN) 1 % GEL Apply 2 g topically 4 (four) times daily. 1 Tube 2  . levothyroxine (SYNTHROID, LEVOTHROID) 50 MCG tablet Take 50 mcg by mouth daily before breakfast.    . omeprazole (PRILOSEC) 20 MG capsule Take 20 mg by mouth daily.    . potassium chloride SA (K-DUR,KLOR-CON) 20 MEQ tablet Take 40 mEq by mouth daily.     . pramipexole (MIRAPEX) 1 MG tablet Take 1 mg by mouth 2 (two) times daily.    . rivaroxaban (XARELTO) 20 MG TABS tablet Take 1 tablet (20 mg total) by mouth daily with supper. 30 tablet 0  . silver sulfADIAZINE (SILVADENE) 1 % cream Apply topically daily. 50  g 0  . spironolactone (ALDACTONE) 100 MG tablet Take 100 mg by mouth daily.     No current facility-administered medications on file prior to visit.    Allergies  Allergen Reactions  . Erythromycin     Rash and Hives  . Naproxen     Makes her congested  . Sulfa Antibiotics     Hives

## 2018-06-29 NOTE — Telephone Encounter (Signed)
Spoke with Rachael Robertson with [facility] and notified of recs per TP  She verbalized understanding  Nothing further needed

## 2018-07-05 ENCOUNTER — Telehealth: Payer: Self-pay | Admitting: *Deleted

## 2018-07-05 ENCOUNTER — Telehealth: Payer: Self-pay | Admitting: Pulmonary Disease

## 2018-07-05 ENCOUNTER — Other Ambulatory Visit: Payer: Self-pay | Admitting: Internal Medicine

## 2018-07-05 NOTE — Telephone Encounter (Signed)
Call from Threasa AlphaJim Hoffman, [facility]: 1. Pt having difficulty breathing; O2 sat 88% on 4 L of oxygen.stated he will call pt's pulmonologist, just want to inform pt's PCP. 2. Pt has pending CT scan ordered; she will not be able to go d/t transportation and money issues (scheduled for Friday). 3. Severe redness from left armpit to waist; she needs 400 mg of Silvadene to cover this large area. She's out - currently using coconut oil. 4. "Not urinating enough" - pt stated she's going less frequently/less out. 5. Decreased appetite over last 24 hrs. Thanks

## 2018-07-05 NOTE — Telephone Encounter (Signed)
Scan and will not be able to attend due to finances.  Mobile x ray phone number is [phone]. Fax number is [phone].  [facility] phone number is [phone].

## 2018-07-05 NOTE — Telephone Encounter (Signed)
Rachael Robertson patient with trach, on Trilogy vent at bedtime seen for Chronic Resp Failure Last office visit 12.20.2019 with [name]  Called spoke with patient who reports wheezing, chest congestion, prod cough with white- to - clear mucus, increased SOB x4 days.  Denies any f/c/s, hemoptysis, head congestion, PND.  Home Health RN had recommended patient have a cxr to patient's PCP but that provider declined and recommended that the order come from this office.  Did offer appt to patient but she declined d/t transportation issues and stated that she will likely have to cancel her 1.29.2020 trach clinic appt for this same reason.  Patient is scheduled for 6 week follow up with [name] on 2.3.2020.  Patient stated that she is NOT in an acute state and is okay with answer for mobile xray tomorrow morning.  Patient is aware to seek emergency attention if symptoms worsen.  Will change priority of message to 'high' for patient's symptoms per triage protocol.  Because [name] saw patient last, will route to [place] NP to ask if okay to order mobile xray.  Please advise, thank you.

## 2018-07-05 NOTE — Telephone Encounter (Signed)
Patient is incorrect she is in an acute state.  She is reporting acute symptoms which she is wanting acute treatments for.  I personally would not prefer managing a mobile xray.    Triage please contact the patient explain that she would benefit from an office visit she has an appointment to see Dr. Wynona Neat on 07/12/2018, and needs to keep her appointment with the trach clinic on 07/07/2018.  I discussed this with the patient multiple times that this is part of managing her chronic care.  She has had these appointments for over a month and has had the opportunity multiple times to coordinate transportation to those office visits.  Could coordinate ordering a chest x-ray while patient is being seen at trach clinic.  Elisha Headland FNP

## 2018-07-05 NOTE — Telephone Encounter (Signed)
Needs refill on   silver sulfADIAZINE (SILVADENE) 1 % cream    at [id number] - [place], [place] - [address] AT [facility] [address]  ;pt contact [phone]  Refill with a 50g jar she needs the 400g jar

## 2018-07-06 ENCOUNTER — Telehealth: Payer: Self-pay

## 2018-07-06 MED ORDER — SILVER SULFADIAZINE 1 % EX CREA: TOPICAL_CREAM | Freq: Every day | CUTANEOUS | 1 refills | Status: AC

## 2018-07-06 NOTE — Telephone Encounter (Signed)
This has been completed.

## 2018-07-06 NOTE — Telephone Encounter (Signed)
silver sulfADIAZINE (SILVADENE) 1 % cream, refill request @  [id number] - [place],  - [address] AT [facility] [address] [facility] [address] [phone] (Phone) [phone] (Fax)

## 2018-07-06 NOTE — Telephone Encounter (Signed)
Please call and follow up with her regarding how she is doing and offer her an [facility] appointment if needed

## 2018-07-06 NOTE — Telephone Encounter (Signed)
Spoke with pt. She is aware of Brian's response. Nothing further was needed. 

## 2018-07-07 ENCOUNTER — Ambulatory Visit (HOSPITAL_COMMUNITY)
Admission: RE | Admit: 2018-07-07 | Discharge: 2018-07-07 | Disposition: A | Discharge status: Home / Self Care | Payer: Medicare Other | Source: Ambulatory Visit | Attending: Acute Care | Admitting: Acute Care

## 2018-07-07 DIAGNOSIS — Z430 Encounter for attention to tracheostomy: Secondary | ICD-10-CM | POA: Diagnosis not present

## 2018-07-07 NOTE — Progress Notes (Signed)
Tracheostomy Procedure Note  Marshal Stoos [id number] 06/09/1951  Pre Procedure Tracheostomy Information  Trach Brand: Shiley Size: 6.0 Style: Distal and Uncuffed Secured by: Velcro   Procedure: trach change    Post Procedure Tracheostomy Information  Trach Brand: Shiley Size: 6.0 Style: Distal and Uncuffed Secured by: Velcro   Post Procedure Evaluation:  ETCO2 positive color change from yellow to purple : Yes.   Vital signs: pulse 70, respirations 24 and pulse oximetry 100 % Patients current condition: stable Complications: No apparent complications Trach site exam: clean, dry Wound care done: dry, sterile and 4 x 4 gauze Patient did tolerate procedure well.   Education: none  Prescription needs: none    Additional needs: none

## 2018-07-07 NOTE — Telephone Encounter (Signed)
Called pt - no answer; left message to call the office . 

## 2018-07-08 ENCOUNTER — Inpatient Hospital Stay (HOSPITAL_COMMUNITY): Admit: 2018-07-08 | Payer: Self-pay

## 2018-07-09 ENCOUNTER — Emergency Department (HOSPITAL_COMMUNITY)
Admission: EM | Admit: 2018-07-09 | Discharge: 2018-07-10 | Disposition: A | Discharge status: Home / Self Care | Payer: Medicare Other | Attending: Emergency Medicine | Admitting: Emergency Medicine

## 2018-07-09 ENCOUNTER — Other Ambulatory Visit: Payer: Self-pay

## 2018-07-09 ENCOUNTER — Telehealth: Payer: Self-pay | Admitting: Internal Medicine

## 2018-07-09 ENCOUNTER — Emergency Department (HOSPITAL_COMMUNITY): Payer: Medicare Other

## 2018-07-09 ENCOUNTER — Telehealth: Payer: Self-pay

## 2018-07-09 ENCOUNTER — Inpatient Hospital Stay: Admission: RE | Admit: 2018-07-09 | Payer: Medicare Other | Source: Ambulatory Visit

## 2018-07-09 DIAGNOSIS — W231XXA Caught, crushed, jammed, or pinched between stationary objects, initial encounter: Secondary | ICD-10-CM | POA: Insufficient documentation

## 2018-07-09 DIAGNOSIS — Y998 Other external cause status: Secondary | ICD-10-CM | POA: Insufficient documentation

## 2018-07-09 DIAGNOSIS — Y9389 Activity, other specified: Secondary | ICD-10-CM | POA: Insufficient documentation

## 2018-07-09 DIAGNOSIS — Z7722 Contact with and (suspected) exposure to environmental tobacco smoke (acute) (chronic): Secondary | ICD-10-CM | POA: Diagnosis not present

## 2018-07-09 DIAGNOSIS — S8991XA Unspecified injury of right lower leg, initial encounter: Secondary | ICD-10-CM | POA: Diagnosis present

## 2018-07-09 DIAGNOSIS — Y92013 Bedroom of single-family (private) house as the place of occurrence of the external cause: Secondary | ICD-10-CM | POA: Diagnosis not present

## 2018-07-09 DIAGNOSIS — Z79899 Other long term (current) drug therapy: Secondary | ICD-10-CM | POA: Diagnosis not present

## 2018-07-09 DIAGNOSIS — Z7901 Long term (current) use of anticoagulants: Secondary | ICD-10-CM | POA: Diagnosis not present

## 2018-07-09 DIAGNOSIS — S8011XA Contusion of right lower leg, initial encounter: Secondary | ICD-10-CM | POA: Diagnosis not present

## 2018-07-09 MED ORDER — TRAMADOL HCL 50 MG PO TABS: 50.0000 mg | ORAL_TABLET | Freq: Four times a day (QID) | ORAL | 0 refills | Status: DC | PRN

## 2018-07-09 NOTE — ED Triage Notes (Signed)
Pt to ED from home for hematoma on RLE. Pt injured it yesterday with a near fall, but lowered herself to the ground. Today pt was moving around the house and hit her leg on the couch which made it worse. 100 mcg Fentanyl and 4 mg zofran given PTA.

## 2018-07-09 NOTE — Telephone Encounter (Signed)
Kat with [facility] requesting VO for OT. Please call back.  

## 2018-07-09 NOTE — Telephone Encounter (Signed)
Per Rosanne Ashing from [facility] needs verbal orders; pt contact [phone]

## 2018-07-09 NOTE — Telephone Encounter (Signed)
I agree w home health PT referral

## 2018-07-09 NOTE — ED Notes (Signed)
Pt normally takes Xarelto but has been out of it for 3-4 weeks

## 2018-07-09 NOTE — ED Notes (Signed)
Patient transported to X-ray 

## 2018-07-09 NOTE — Telephone Encounter (Signed)
Verbal Berkley Harvey given to Kevin Fenton, PT with [facility] for [facility] PT 1 week 4 to work on functional mobility. Will route to PCP for agreement/denial. Kinnie Feil, RN, BSN

## 2018-07-09 NOTE — Discharge Instructions (Signed)
You have been evaluated for your knee injury.  You have a large bruise to your right leg but no evidence of any broken bone or dislocation of the knee.  Keep ace wrap and ice your knee for comfort.  Take tramadol as needed for pain.  Keep leg elevate to decrease swelling.  Follow up with your doctor for further care.

## 2018-07-09 NOTE — ED Provider Notes (Signed)
[facility] Provider Note   CSN: [id number] Arrival date & time: 07/09/18  1411     History   Chief Complaint Chief Complaint  Patient presents with  . Leg Injury    HPI Rachael Robertson is a 68 y.o. female.  The history is provided by the patient. No language interpreter was used.     68 year old female brought here via EMS from home for evaluation of right lower leg injury.  Patient report 5 days ago she went home from her doctor's office.  She was trying to transfer herself from her wheelchair to her bed but somehow got stuck between the 2 with her right leg buckled under her.  She developed pain primarily to her right knee.  Today she accidentally bumped her knee against a hard surface and report worsening pain to the knee with swelling.  Pain is initially moderate to severe but did improve significantly after she received pain medication via EMS.  She denies any associated numbness.  She was taking Xarelto for history of PE but have not been taking the medication for the past several weeks.  She denies any complaints of chest pain lightheadedness dizziness focal numbness or weakness.  She can sometimes ambulate using her walker.  She is morbidly obese.  She denies any pain or hip or ankle pain or any other injury.  She was taken Tylenol at home without adequate relief.   Past Medical History:  Diagnosis Date  . Obesity   . Opiate use   . Tracheostomy in place [facility](HCC)     Patient Active Problem List   Diagnosis Date Noted  . Right Lower Lobe PNU  05/28/2018  . Chronic systolic heart failure (HCC) 05/20/2018  . Arthritis 05/20/2018  . Other chronic pain 05/20/2018  . Lethargy   . Hemoptysis   . OSA (obstructive sleep apnea)   . Tracheostomy status (HCC)   . Hypoxemia   . Chronic respiratory failure with hypoxia (HCC) 05/07/2018    Past Surgical History:  Procedure Laterality Date  . Perianal abscess    . TRACHELECTOMY       OB  History   No obstetric history on file.      Home Medications    Prior to Admission medications   Medication Sig Start Date End Date Taking? Authorizing Provider  acetaminophen (TYLENOL) 500 MG tablet Take 500 mg by mouth 2 (two) times daily as needed for mild pain.     [provider]  albuterol (PROVENTIL HFA;VENTOLIN HFA) 108 (90 Base) MCG/ACT inhaler Inhale 1-2 puffs into the lungs every 6 (six) hours as needed for wheezing or shortness of breath.    [provider]  allopurinol (ZYLOPRIM) 300 MG tablet Take 300 mg by mouth daily.    [provider]  budesonide-formoterol (SYMBICORT) 160-4.5 MCG/ACT inhaler Inhale 2 puffs into the lungs 2 (two) times daily.    [provider]  bumetanide (BUMEX) 2 MG tablet Take 2 mg by mouth daily.     [provider]  carvedilol (COREG) 3.125 MG tablet Take 3.125 mg by mouth 2 (two) times daily with a meal.     [provider]  cetirizine (ZYRTEC) 10 MG tablet Take 10 mg by mouth daily.    [provider]  diclofenac sodium (VOLTAREN) 1 % GEL Apply 2 g topically 4 (four) times daily. 05/20/18   Rehman, Areeg N, DO  levothyroxine (SYNTHROID, LEVOTHROID) 50 MCG tablet Take 50 mcg by mouth daily  before breakfast.    [provider]  omeprazole (PRILOSEC) 20 MG capsule Take 20 mg by mouth daily.    [provider]  potassium chloride SA (K-DUR,KLOR-CON) 20 MEQ tablet Take 40 mEq by mouth daily.     [provider]  pramipexole (MIRAPEX) 1 MG tablet Take 1 mg by mouth 2 (two) times daily.    [provider]  rivaroxaban (XARELTO) 20 MG TABS tablet Take 1 tablet (20 mg total) by mouth daily with supper. 05/17/18   Theotis Barrio, MD  silver sulfADIAZINE (SILVADENE) 1 % cream Apply topically daily. 07/06/18   Chundi, Sherlyn Lees, MD  spironolactone (ALDACTONE) 100 MG tablet Take 100 mg by mouth daily.    [provider]    Family History Family History    Problem Relation Age of Onset  . Aneurysm Mother   . Heart disease Father   . Skin cancer Father   . Sleep apnea Father        TRACHEOSTOMY AS WELL  . Breast cancer Sister     Social History Social History   Tobacco Use  . Smoking status: Passive Smoke Exposure - Never Smoker  . Smokeless tobacco: Never Used  Substance Use Topics  . Alcohol use: Yes    Comment: 1-2 per year   . Drug use: Not on file     Allergies   Erythromycin; Naproxen; and Sulfa antibiotics   Review of Systems Review of Systems  Constitutional: Negative for fever.  Musculoskeletal: Positive for arthralgias and joint swelling.  Skin: Negative for wound.  Neurological: Negative for numbness.     Physical Exam Updated Vital Signs BP (!) 126/45 (BP Location: Left Arm)   Pulse (!) 54   Temp 97.6 F (36.4 C) (Oral)   Resp 16   Ht 5' (1.524 m)   Wt (!) 191 kg   SpO2 100%   BMI 82.22 kg/m   Physical Exam Vitals signs and nursing note reviewed.  Constitutional:      General: She is not in acute distress.    Appearance: She is well-developed. She is obese.     Comments: Morbidly obese female laying in bed in no acute discomfort.  HENT:     Head: Atraumatic.  Eyes:     Conjunctiva/sclera: Conjunctivae normal.  Neck:     Musculoskeletal: Neck supple.  Musculoskeletal:        General: Signs of injury (Right knee: Moderate size ecchymosis noted to the anterior knee with tenderness to palpation.  Decreased knees flexion and extension secondary to pain.  No obvious deformity.  Examination limited due to large body habitus.  No tenderness to right hip) present.  Skin:    Findings: No rash.  Neurological:     Mental Status: She is alert.      ED Treatments / Results  Labs (all labs ordered are listed, but only abnormal results are displayed) Labs Reviewed - No data to display  EKG None  Radiology Dg Knee Complete 4 Views Right  Result Date: 07/09/2018 CLINICAL DATA:  Soft tissue  hematoma secondary to a fall two days ago. EXAM: RIGHT KNEE - COMPLETE 4+ VIEW COMPARISON:  None. FINDINGS: There is a 19 x 7 cm superficial subcutaneous density in the anterior aspect of the right lower leg consistent with a hematoma. There is no fracture or dislocation. 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 IMPRESSION: 1. Prominent soft tissue hematoma in the anterior aspect of the right lower leg. 2. No acute bone abnormality. 3. Severe arthritic changes of the right knee as described. Electronically Signed   By: Francene Boyers M.D.   On: 07/09/2018 15:22    Procedures Procedures (including critical care time)  Medications Ordered in ED Medications - No data to display   Initial Impression / Assessment and Plan / ED Course  I have reviewed the triage vital signs and the nursing notes.  Pertinent labs & imaging results that were available during my care of the patient were reviewed by me and considered in my medical decision making (see chart for details).     BP (!) 126/45 (BP Location: Left Arm)   Pulse (!) 54   Temp 97.6 F (36.4 C) (Oral)   Resp 16   Ht 5' (1.524 m)   Wt (!) 191 kg   SpO2 100%   BMI 82.22 kg/m    Final Clinical Impressions(s) / ED Diagnoses   Final diagnoses:  Contusion of right lower leg, initial encounter    ED Discharge Orders         Ordered    traMADol (ULTRAM) 50 MG tablet  Every 6 hours PRN     07/09/18 1600         2:52 PM Patient here with right knee injury from a mechanical fall 5 days ago and now reinjuring when she bumped her knee against a hard surface.  She has a large hematoma involving the anterior knee with decreased knee flexion and extension secondary to pain.  Will obtain x-ray for further evaluation.  4:02 PM X-ray of right knee shows prominent soft tissue hematoma to the anterior  aspect of the right lower leg without any acute bony abnormalities.  Severe arthritic changes noted in the right knee.  This information was relayed to patient.  Rice therapy discussed.  Patient discharged home with pain medication.  She will follow-up with PCP for further care.  Return precaution discussed.   Fayrene Helper, PA-C 07/09/18 1603    Lockie Mola, Adam, DO 07/09/18 1922

## 2018-07-09 NOTE — Telephone Encounter (Signed)
Returned call to [place], OT with [facility]. Verbal auth given to re-certify patient for [facility] OT. Patient has made some progress but would benefit from additional OT to work on upper extremity, shoulder movement and strength for ADLs. Goal is for patient to get into walk-in shower. Kat requesting 2 week 1 to re-certify then 2 week 3. Patient also working with [facility] PT and HH nursing. Should expect call for new orders from them as well. Will route to PCP for agreement Candie Chroman, RN, BSN

## 2018-07-12 ENCOUNTER — Encounter: Payer: Self-pay | Admitting: Pulmonary Disease

## 2018-07-12 ENCOUNTER — Ambulatory Visit (INDEPENDENT_AMBULATORY_CARE_PROVIDER_SITE_OTHER): Payer: Medicare Other | Admitting: Pulmonary Disease

## 2018-07-12 VITALS — BP 116/60 | HR 66

## 2018-07-12 DIAGNOSIS — Z930 Tracheostomy status: Secondary | ICD-10-CM | POA: Diagnosis not present

## 2018-07-12 DIAGNOSIS — J9611 Chronic respiratory failure with hypoxia: Secondary | ICD-10-CM | POA: Diagnosis not present

## 2018-07-12 DIAGNOSIS — G4733 Obstructive sleep apnea (adult) (pediatric): Secondary | ICD-10-CM | POA: Diagnosis not present

## 2018-07-12 MED ORDER — RIVAROXABAN (XARELTO) VTE STARTER PACK (15 & 20 MG): ORAL_TABLET | ORAL | 0 refills | Status: DC

## 2018-07-12 NOTE — Progress Notes (Signed)
Rachael Robertson    [id number]    [birth year]  Primary Care Physician:Chundi, Sherlyn Lees, MD  Referring Physician: Lorenso Courier, MD [address] [place], [place] [address]  Chief complaint:  0 Patient being followed up for hypercapnic respiratory failure, obesity hypoventilation, obstructive sleep apnea  HPI: She is doing relatively well Was recently in the hospital for evaluation Denies any pain or discomfort  Has been having some issues with sleep Denies having any significant issues with a trilogy vent  She will be working with a physical therapist-has an appointment today  She has been off Xarelto for about a month She is like less active than she usually is We did discuss reinitiating Xarelto because of the risks-did not have any issues with taking it in the past  I had seen her recently in the hospital for follow-up for hemoptysis Not longer having hemoptysis  She has a history of obesity hypoventilation, hypercapnic respiratory failure, 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 acetaminophen (TYLENOL) 500 MG tablet Take 500 mg by mouth 2 (two) times daily as needed for mild pain.   Marland Kitchen albuterol (PROVENTIL HFA;VENTOLIN HFA) 108 (90 Base) MCG/ACT inhaler Inhale 1-2 puffs into the lungs every 6 (six) hours as needed for wheezing or shortness of breath.  . allopurinol (ZYLOPRIM) 300 MG tablet Take 100 mg by mouth 2 (two) times daily.   . budesonide-formoterol (SYMBICORT) 160-4.5 MCG/ACT inhaler Inhale 2 puffs into the lungs 2 (two) times daily.  . bumetanide (BUMEX) 2 MG tablet Take 4 mg by mouth daily.   . carvedilol (COREG)  3.125 MG tablet Take 3.125 mg by mouth 2 (two) times daily with a meal.   . diclofenac sodium (VOLTAREN) 1 % GEL Apply 2 g topically 4 (four) times daily.  Marland Kitchen levothyroxine (SYNTHROID, LEVOTHROID) 50 MCG tablet Take 50 mcg by mouth daily before breakfast.  . omeprazole (PRILOSEC) 20 MG capsule Take 20 mg by mouth daily.  . potassium chloride SA (K-DUR,KLOR-CON) 20 MEQ tablet Take 40 mEq by mouth daily.   . pramipexole (MIRAPEX) 1 MG tablet Take 1 mg by mouth 2 (two) times daily.  . silver sulfADIAZINE (SILVADENE) 1 % cream Apply topically daily.  Marland Kitchen spironolactone (ALDACTONE) 100 MG tablet Take 100 mg by mouth daily.  . traMADol (ULTRAM) 50 MG tablet Take 1 tablet (50 mg total) by mouth every 6 (six) hours as needed for moderate pain.  . rivaroxaban (XARELTO) 20 MG TABS tablet Take 1 tablet (20 mg total) by mouth daily with supper. (Patient not taking: Reported on 07/12/2018)  . Rivaroxaban 15 & 20 MG TBPK Take as directed on package: Start with one 15mg  tablet by mouth twice a day with food. On Day 22, switch to one 20mg  tablet once a day with food.  . [DISCONTINUED] cetirizine (ZYRTEC) 10 MG tablet Take 10 mg by mouth daily.   No facility-administered encounter medications on file as of 07/12/2018.     Allergies as of 07/12/2018 - Review Complete 07/12/2018  Allergen Reaction Noted  . Erythromycin  05/03/2018  . Naproxen  05/03/2018  . Sulfa antibiotics  05/03/2018    Past Medical History:  Diagnosis Date  . Obesity   . Opiate use   . Tracheostomy in place [facility](HCC)     Past Surgical History:  Procedure Laterality Date  . Perianal abscess    . TRACHELECTOMY      Family History  Problem Relation Age of Onset  . Aneurysm Mother   . Heart disease Father   . Skin cancer Father   . Sleep apnea Father        TRACHEOSTOMY AS WELL  . Breast cancer Sister     Social History   Socioeconomic History  . Marital status: Divorced    Spouse name: Not on file  . Number of children: Not on  file  . Years of education: Not on file  . Highest education level: Not on file  Occupational History  . Not on file  Social Needs  . Financial resource strain: Not on file  . Food insecurity:    Worry: Not on file    Inability: Not on file  . Transportation needs:    Medical: Not on file    Non-medical: Not on file  Tobacco Use  . Smoking status: Passive Smoke Exposure - Never Smoker  . Smokeless tobacco: Never Used  Substance and Sexual Activity  . Alcohol use: Yes    Comment: 1-2 per year   . Drug use: Not on file  . Sexual activity: Not on file  Lifestyle  . Physical activity:    Days per week: Not on file    Minutes per session: Not on file  . Stress: Not on file  Relationships  . Social connections:    Talks on phone: Not on file    Gets together: Not on file    Attends religious service: Not on file    Active member of club or organization: Not on file    Attends meetings of clubs or organizations: Not on file    Relationship status: Not on file  . Intimate partner violence:    Fear of current or ex partner: Not on file    Emotionally abused: Not on file    Physically abused: Not on file    Forced sexual activity: Not on file  Other Topics Concern  . Not on file  Social History Narrative  . Not on file    Review of Systems  Constitutional: Positive for fatigue.  HENT: Negative.   Eyes: Negative.   Respiratory: Negative for chest tightness and shortness of breath.   Cardiovascular: Positive for leg swelling.  Gastrointestinal: Negative.   Endocrine: Negative.   Genitourinary: Negative.   Musculoskeletal: Positive for arthralgias, back pain, gait problem and myalgias.  Skin: Negative.   Allergic/Immunologic: Positive for immunocompromised state.  All other systems reviewed and are negative.   Vitals:   07/12/18 0947 07/12/18 0948  BP: 116/60   Pulse:  66  SpO2:  92%     Physical Exam  Constitutional: No distress.  Super obese  HENT:  Head:  Normocephalic and atraumatic.  Eyes: Pupils are equal, round, and reactive to light. Right eye exhibits no discharge. Left eye exhibits no discharge.  Neck: Normal range of motion. Neck supple.  Cardiovascular: Normal rate and regular rhythm.  Pulmonary/Chest: Effort normal and breath sounds normal. No stridor. No respiratory distress. She has no wheezes.  Poor air movement based on body habitus  Abdominal: Soft. Bowel sounds are normal.  Musculoskeletal:        General: Edema  present.  Lymphadenopathy:    She has no cervical adenopathy.  Skin: She is not diaphoretic.    Data Reviewed: Hospital records reviewed Radiological data reviewed  Assessment:  Obesity hypoventilation syndrome -To continue using trilogy vent at night Hypercapnic respiratory failure -Continue using trilogy vent at night  Recent hemoptysis-resolved  Morbid obesity  Requires trilogy vent-encouraged to continue using it  History of DVT/PE -Has been off Xarelto -Risk of thromboembolism remains significant  Plan/Recommendations:  Reinitiate use of Xarelto Encouraged to continue using trilogy vent at night Encouraged about physical activity  Encouraged to follow-up with the trach clinic  Continue physical therapy  I will see her back in the office in about 71months  Encouraged to call if any significant concerns     Virl Diamond MD [facility] 07/12/2018, 10:18 AM  CC: Lorenso Courier, MD

## 2018-07-12 NOTE — Patient Instructions (Signed)
Continue using Trilogy Reinitiate Xarelto Encourage physical activity  I will see you back in the office in about 3 months

## 2018-07-14 ENCOUNTER — Telehealth: Payer: Self-pay

## 2018-07-14 ENCOUNTER — Other Ambulatory Visit: Payer: Self-pay | Admitting: Pulmonary Disease

## 2018-07-14 ENCOUNTER — Telehealth: Payer: Self-pay | Admitting: Pulmonary Disease

## 2018-07-14 MED ORDER — LEVOFLOXACIN 500 MG PO TABS: 500.0000 mg | ORAL_TABLET | Freq: Every day | ORAL | 0 refills | Status: DC

## 2018-07-14 NOTE — Telephone Encounter (Signed)
Left detailed message for Beth, nurse at [facility].  Spoke to pt and relayed Dr Trena Platt recommendations.  Told pt that an antibiotic was sent in to her preferred pharmacy.  Pt stated pharmacy is going to deliver her medications. Instructed pt to seek emergency care if she feels worse or if she coughs up a large amount of blood.  Pt stated understanding.  Nothing further needed.

## 2018-07-14 NOTE — Telephone Encounter (Signed)
Rachael Robertson with [facility] requesting to speak with a nurse. Please call back.

## 2018-07-14 NOTE — Telephone Encounter (Signed)
Sent in a prescription for Levaquin 500 p.o. daily for 7 days  Likely related to a bronchitic infection   She should go to the hospital if she coughs up a large amount of blood

## 2018-07-14 NOTE — Telephone Encounter (Signed)
Spoke to [place] from [facility].  She stated pt has been coughing up increased amount of yellow to white mucus since after OV on 07/12/18.  Today, 07/14/18 she is coughing up green mucus with old blood mixed in.  One time today she had a bright red streak of blood.  Pt has scattered rhonchi in upper lobes.  Pt has not been taking mucinex due to person who picks up meds for her is out of town.  Beth had informed her that pharmacies now deliver.  Pt denies fever, sweats, or chills. Dr. Wynona Neat please advise.

## 2018-07-14 NOTE — Telephone Encounter (Signed)
HHN seeing pt today for 2 nd time. 1x week for 3 weeks disease management and wound progression (has hematoma on L knee from 2 falls recently) VO given, do you agree?

## 2018-07-14 NOTE — Telephone Encounter (Signed)
Lm for rtc 

## 2018-07-15 ENCOUNTER — Telehealth: Payer: Self-pay

## 2018-07-15 NOTE — Telephone Encounter (Signed)
Also OT calls for extended orders  2x week for 2 weeks 1x week for 1week for safety and ease in showering and personal care. VO given do you agree?

## 2018-07-15 NOTE — Telephone Encounter (Signed)
Added to VO of 2/5.

## 2018-07-15 NOTE — Telephone Encounter (Signed)
Agree 

## 2018-07-15 NOTE — Telephone Encounter (Signed)
Kat with [facility] requesting VO for OT. Please call back.

## 2018-07-17 ENCOUNTER — Other Ambulatory Visit: Payer: Self-pay | Admitting: Pulmonary Disease

## 2018-07-19 ENCOUNTER — Inpatient Hospital Stay (HOSPITAL_COMMUNITY)
Admission: EM | Admit: 2018-07-19 | Discharge: 2018-07-22 | DRG: 208 | Disposition: A | Discharge status: Home Health | Payer: Medicare Other | Attending: Internal Medicine | Admitting: Internal Medicine

## 2018-07-19 ENCOUNTER — Encounter (HOSPITAL_COMMUNITY): Payer: Self-pay

## 2018-07-19 ENCOUNTER — Emergency Department (HOSPITAL_COMMUNITY): Payer: Medicare Other

## 2018-07-19 ENCOUNTER — Other Ambulatory Visit: Payer: Self-pay

## 2018-07-19 DIAGNOSIS — E860 Dehydration: Secondary | ICD-10-CM | POA: Diagnosis present

## 2018-07-19 DIAGNOSIS — N390 Urinary tract infection, site not specified: Secondary | ICD-10-CM | POA: Diagnosis present

## 2018-07-19 DIAGNOSIS — Z86711 Personal history of pulmonary embolism: Secondary | ICD-10-CM

## 2018-07-19 DIAGNOSIS — E662 Morbid (severe) obesity with alveolar hypoventilation: Secondary | ICD-10-CM | POA: Diagnosis present

## 2018-07-19 DIAGNOSIS — W050XXA Fall from non-moving wheelchair, initial encounter: Secondary | ICD-10-CM | POA: Diagnosis present

## 2018-07-19 DIAGNOSIS — D62 Acute posthemorrhagic anemia: Secondary | ICD-10-CM | POA: Diagnosis present

## 2018-07-19 DIAGNOSIS — Z6841 Body Mass Index (BMI) 40.0 and over, adult: Secondary | ICD-10-CM

## 2018-07-19 DIAGNOSIS — M109 Gout, unspecified: Secondary | ICD-10-CM | POA: Diagnosis present

## 2018-07-19 DIAGNOSIS — Z79899 Other long term (current) drug therapy: Secondary | ICD-10-CM

## 2018-07-19 DIAGNOSIS — J9500 Unspecified tracheostomy complication: Principal | ICD-10-CM | POA: Diagnosis present

## 2018-07-19 DIAGNOSIS — R200 Anesthesia of skin: Secondary | ICD-10-CM | POA: Diagnosis present

## 2018-07-19 DIAGNOSIS — K219 Gastro-esophageal reflux disease without esophagitis: Secondary | ICD-10-CM | POA: Diagnosis present

## 2018-07-19 DIAGNOSIS — Z8249 Family history of ischemic heart disease and other diseases of the circulatory system: Secondary | ICD-10-CM

## 2018-07-19 DIAGNOSIS — D689 Coagulation defect, unspecified: Secondary | ICD-10-CM | POA: Diagnosis present

## 2018-07-19 DIAGNOSIS — Z79890 Hormone replacement therapy (postmenopausal): Secondary | ICD-10-CM

## 2018-07-19 DIAGNOSIS — W2203XA Walked into furniture, initial encounter: Secondary | ICD-10-CM | POA: Diagnosis present

## 2018-07-19 DIAGNOSIS — L304 Erythema intertrigo: Secondary | ICD-10-CM | POA: Diagnosis present

## 2018-07-19 DIAGNOSIS — Z803 Family history of malignant neoplasm of breast: Secondary | ICD-10-CM

## 2018-07-19 DIAGNOSIS — J9611 Chronic respiratory failure with hypoxia: Secondary | ICD-10-CM | POA: Diagnosis present

## 2018-07-19 DIAGNOSIS — J9612 Chronic respiratory failure with hypercapnia: Secondary | ICD-10-CM | POA: Diagnosis present

## 2018-07-19 DIAGNOSIS — I5022 Chronic systolic (congestive) heart failure: Secondary | ICD-10-CM | POA: Diagnosis present

## 2018-07-19 DIAGNOSIS — Z808 Family history of malignant neoplasm of other organs or systems: Secondary | ICD-10-CM

## 2018-07-19 DIAGNOSIS — R042 Hemoptysis: Secondary | ICD-10-CM | POA: Diagnosis present

## 2018-07-19 DIAGNOSIS — L03115 Cellulitis of right lower limb: Secondary | ICD-10-CM | POA: Diagnosis present

## 2018-07-19 DIAGNOSIS — Z7901 Long term (current) use of anticoagulants: Secondary | ICD-10-CM

## 2018-07-19 DIAGNOSIS — B372 Candidiasis of skin and nail: Secondary | ICD-10-CM | POA: Diagnosis present

## 2018-07-19 DIAGNOSIS — N170 Acute kidney failure with tubular necrosis: Secondary | ICD-10-CM | POA: Diagnosis present

## 2018-07-19 DIAGNOSIS — G2581 Restless legs syndrome: Secondary | ICD-10-CM | POA: Diagnosis present

## 2018-07-19 DIAGNOSIS — R3129 Other microscopic hematuria: Secondary | ICD-10-CM | POA: Diagnosis present

## 2018-07-19 DIAGNOSIS — E039 Hypothyroidism, unspecified: Secondary | ICD-10-CM | POA: Diagnosis present

## 2018-07-19 DIAGNOSIS — B964 Proteus (mirabilis) (morganii) as the cause of diseases classified elsewhere: Secondary | ICD-10-CM | POA: Diagnosis present

## 2018-07-19 DIAGNOSIS — S8001XA Contusion of right knee, initial encounter: Secondary | ICD-10-CM | POA: Diagnosis present

## 2018-07-19 DIAGNOSIS — Y92009 Unspecified place in unspecified non-institutional (private) residence as the place of occurrence of the external cause: Secondary | ICD-10-CM

## 2018-07-19 LAB — CBC WITH DIFFERENTIAL/PLATELET
Abs Immature Granulocytes: 0.1 10*3/uL — ABNORMAL HIGH (ref 0.00–0.07)
BASOS ABS: 0 10*3/uL (ref 0.0–0.1)
Basophils Relative: 0 %
Eosinophils Absolute: 0.2 10*3/uL (ref 0.0–0.5)
Eosinophils Relative: 3 %
HCT: 26.9 % — ABNORMAL LOW (ref 36.0–46.0)
Hemoglobin: 8.1 g/dL — ABNORMAL LOW (ref 12.0–15.0)
IMMATURE GRANULOCYTES: 1 %
Lymphocytes Relative: 10 %
Lymphs Abs: 0.8 10*3/uL (ref 0.7–4.0)
MCH: 27.5 pg (ref 26.0–34.0)
MCHC: 30.1 g/dL (ref 30.0–36.0)
MCV: 91.2 fL (ref 80.0–100.0)
Monocytes Absolute: 0.5 10*3/uL (ref 0.1–1.0)
Monocytes Relative: 6 %
NEUTROS ABS: 5.8 10*3/uL (ref 1.7–7.7)
NRBC: 0 % (ref 0.0–0.2)
Neutrophils Relative %: 80 %
Platelets: 203 10*3/uL (ref 150–400)
RBC: 2.95 MIL/uL — ABNORMAL LOW (ref 3.87–5.11)
RDW: 16 % — ABNORMAL HIGH (ref 11.5–15.5)
WBC: 7.4 10*3/uL (ref 4.0–10.5)

## 2018-07-19 LAB — COMPREHENSIVE METABOLIC PANEL
ALT: 20 U/L (ref 0–44)
AST: 20 U/L (ref 15–41)
Albumin: 2.6 g/dL — ABNORMAL LOW (ref 3.5–5.0)
Alkaline Phosphatase: 82 U/L (ref 38–126)
Anion gap: 13 (ref 5–15)
BUN: 76 mg/dL — ABNORMAL HIGH (ref 8–23)
CO2: 34 mmol/L — ABNORMAL HIGH (ref 22–32)
Calcium: 8.4 mg/dL — ABNORMAL LOW (ref 8.9–10.3)
Chloride: 92 mmol/L — ABNORMAL LOW (ref 98–111)
Creatinine, Ser: 1.94 mg/dL — ABNORMAL HIGH (ref 0.44–1.00)
GFR calc Af Amer: 31 mL/min — ABNORMAL LOW (ref >60)
GFR calc non Af Amer: 27 mL/min — ABNORMAL LOW (ref >60)
Glucose, Bld: 128 mg/dL — ABNORMAL HIGH (ref 70–99)
Potassium: 4.1 mmol/L (ref 3.5–5.1)
Sodium: 139 mmol/L (ref 135–145)
Total Bilirubin: 0.6 mg/dL (ref 0.3–1.2)
Total Protein: 7.5 g/dL (ref 6.5–8.1)

## 2018-07-19 NOTE — ED Triage Notes (Signed)
Per EMS PT from home, [facility]  RN observed blood around trach and blood in urine. Pt also bleeding from multiple skin tears. 100% trach mask 4l. Denies SOB or trouble breathing

## 2018-07-19 NOTE — ED Notes (Signed)
IV Team at bedside 

## 2018-07-19 NOTE — Progress Notes (Signed)
Review of PIV consult and pt flowsheet. PIV currently documented with no IV medication orders. Spoke to ED RN who states she will confirm need for PIV with assigned RN. Advised to re-enter consult if still needed.

## 2018-07-19 NOTE — ED Provider Notes (Addendum)
[facility] EMERGENCY DEPARTMENT Provider Note   CSN: [id number] Arrival date & time: 07/19/18  2111     History   Chief Complaint Chief Complaint  Patient presents with  . Coagulation Disorder    HPI Rachael Robertson is a 68 y.o. female.   Hematuria  This is a new problem. The current episode started yesterday. The problem occurs rarely. The problem has not changed since onset.Pertinent negatives include no chest pain, no abdominal pain, no headaches and no shortness of breath. Nothing aggravates the symptoms. Nothing relieves the symptoms. The treatment provided no relief.    Past Medical History:  Diagnosis Date  . Obesity   . Opiate use   . Tracheostomy in place [facility])     Patient Active Problem List   Diagnosis Date Noted  . Right Lower Lobe PNU  05/28/2018  . Chronic systolic heart failure (HCC) 05/20/2018  . Arthritis 05/20/2018  . Other chronic pain 05/20/2018  . Lethargy   . Hemoptysis   . OSA (obstructive sleep apnea)   . Tracheostomy status (HCC)   . Hypoxemia   . Chronic respiratory failure with hypoxia (HCC) 05/07/2018    Past Surgical History:  Procedure Laterality Date  . Perianal abscess    . TRACHELECTOMY       OB History   No obstetric history on file.      Home Medications    Prior to Admission medications   Medication Sig Start Date End Date Taking? Authorizing Provider  acetaminophen (TYLENOL) 650 MG CR tablet Take 1,300 mg by mouth 2 (two) times daily.   Yes [provider]  albuterol (PROVENTIL HFA;VENTOLIN HFA) 108 (90 Base) MCG/ACT inhaler Inhale 1-2 puffs into the lungs every 6 (six) hours as needed for wheezing or shortness of breath.   Yes [provider]  allopurinol (ZYLOPRIM) 100 MG tablet Take 100 mg by mouth 2 (two) times daily.   Yes [provider]  budesonide-formoterol (SYMBICORT) 160-4.5 MCG/ACT inhaler Inhale 2 puffs into the lungs 2 (two) times daily.   Yes [provider]  bumetanide (BUMEX) 2 MG tablet Take 4 mg by mouth daily.    Yes [provider]  carvedilol (COREG) 3.125 MG tablet Take 3.125 mg by mouth See admin instructions. Take 3.125 mg by mouth two times a day with meals ONLY IF SYSTOLIC NUMBER IS 100 OR GREATER   Yes [provider]  diclofenac sodium (VOLTAREN) 1 % GEL Apply 2 g topically 4 (four) times daily. Patient taking differently: Apply 2-4 g topically See admin instructions. Apply 2-4 grams topically to knees two times a day 05/20/18  Yes Rehman, Areeg N, DO  levothyroxine (SYNTHROID, LEVOTHROID) 50 MCG tablet Take 50 mcg by mouth daily before breakfast.   Yes [provider]  omeprazole (PRILOSEC) 20 MG capsule Take 20 mg by mouth daily.   Yes [provider]  potassium chloride SA (K-DUR,KLOR-CON) 20 MEQ tablet Take 40 mEq by mouth daily.    Yes [provider]  pramipexole (MIRAPEX) 1 MG tablet Take 1 mg by mouth 2 (two) times daily.   Yes [provider]  levofloxacin (LEVAQUIN) 500 MG tablet Take 1 tablet (500 mg total) by mouth daily. 07/14/18   Tomma Lightning, MD  rivaroxaban (XARELTO) 20 MG TABS tablet Take 1 tablet (20 mg total) by mouth daily with supper. 05/17/18   Theotis Barrio, MD  Rivaroxaban 15 & 20 MG TBPK Take as directed on package: Start with  one 15mg  tablet by mouth twice a day with food. On Day 22, switch to one 20mg  tablet once a day with food. Patient not taking: Reported on 07/19/2018 07/12/18   Tomma Lightning, MD  silver sulfADIAZINE (SILVADENE) 1 % cream Apply topically daily. 07/06/18   Chundi, Sherlyn Lees, MD  spironolactone (ALDACTONE) 100 MG tablet Take 100 mg by mouth daily.    [provider]  traMADol (ULTRAM) 50 MG tablet Take 1 tablet (50 mg total) by mouth every 6 (six) hours as needed for moderate pain. 07/09/18   Fayrene Helper, PA-C    Family History Family History  Problem Relation Age of Onset  . Aneurysm Mother   . Heart disease  Father   . Skin cancer Father   . Sleep apnea Father        TRACHEOSTOMY AS WELL  . Breast cancer Sister     Social History Social History   Tobacco Use  . Smoking status: Passive Smoke Exposure - Never Smoker  . Smokeless tobacco: Never Used  Substance Use Topics  . Alcohol use: Yes    Comment: 1-2 per year   . Drug use: Not on file     Allergies   Other; Fruit & vegetable daily [nutritional supplements]; Lactose intolerance (gi); Naproxen; Soy allergy; Sulfa antibiotics; Erythromycin; Latex; Mupirocin; and Tape   Review of Systems Review of Systems  Constitutional: Negative for chills and fever.  HENT: Negative for ear pain and sore throat.        Patient also had some blood while suctioning today from her stoma  Eyes: Negative for pain and visual disturbance.  Respiratory: Negative for cough and shortness of breath.   Cardiovascular: Negative for chest pain and palpitations.  Gastrointestinal: Negative for abdominal pain and vomiting.  Genitourinary: Positive for hematuria. Negative for dysuria.  Musculoskeletal: Negative for arthralgias and back pain.  Skin: Negative for color change and rash.  Neurological: Negative for seizures, syncope and headaches.  All other systems reviewed and are negative.    Physical Exam Updated Vital Signs BP (!) 107/39   Pulse 60   Temp 98.2 F (36.8 C) (Oral)   Resp 17   Ht 5' (1.524 m)   Wt (!) 191 kg   SpO2 100%   BMI 82.22 kg/m   Physical Exam Vitals signs and nursing note reviewed.  Constitutional:      General: She is not in acute distress.    Appearance: She is well-developed.     Comments: Patient resting comfortably on arrival, no acute distress.  HENT:     Head: Normocephalic and atraumatic.  Eyes:     Conjunctiva/sclera: Conjunctivae normal.  Neck:     Musculoskeletal: Neck supple.     Comments: Stoma in place, no blood visualized at the base or in the stoma. Cardiovascular:     Rate and Rhythm: Normal  rate and regular rhythm.     Heart sounds: No murmur.  Pulmonary:     Effort: Pulmonary effort is normal. No respiratory distress.     Breath sounds: Normal breath sounds.  Abdominal:     Palpations: Abdomen is soft.     Tenderness: There is no abdominal tenderness.  Musculoskeletal:     Comments: Patient has bruising immediately below the right knee to her skin with skin breakdown.  Patient appears to have hematoma under the skin in this area.  Intact pulse, motor, sensory distal to the area of injury.  Does not have increased temperature, does not  appear to be cellulitic.  Skin:    General: Skin is warm and dry.  Neurological:     Mental Status: She is alert.      ED Treatments / Results  Labs (all labs ordered are listed, but only abnormal results are displayed) Labs Reviewed  COMPREHENSIVE METABOLIC PANEL - Abnormal; Notable for the following components:      Result Value   Chloride 92 (*)    CO2 34 (*)    Glucose, Bld 128 (*)    BUN 76 (*)    Creatinine, Ser 1.94 (*)    Calcium 8.4 (*)    Albumin 2.6 (*)    GFR calc non Af Amer 27 (*)    GFR calc Af Amer 31 (*)    All other components within normal limits  CBC WITH DIFFERENTIAL/PLATELET - Abnormal; Notable for the following components:   RBC 2.95 (*)    Hemoglobin 8.1 (*)    HCT 26.9 (*)    RDW 16.0 (*)    Abs Immature Granulocytes 0.10 (*)    All other components within normal limits  URINE CULTURE  URINALYSIS, ROUTINE W REFLEX MICROSCOPIC  BASIC METABOLIC PANEL  CBC    EKG None  Radiology Dg Tibia/fibula Right  Result Date: 07/19/2018 CLINICAL DATA:  Right tib fib wound. EXAM: RIGHT TIBIA AND FIBULA - 2 VIEW COMPARISON:  Knee series 07/09/2018 FINDINGS: Advanced tricompartment degenerative changes within the right knee. No acute bony abnormality. No fracture, subluxation or dislocation. No radiographic changes of osteomyelitis. IMPRESSION: No acute bony abnormality. Electronically Signed   By: Charlett Nose  M.D.   On: 07/19/2018 23:21   Dg Chest Portable 1 View  Result Date: 07/19/2018 CLINICAL DATA:  Blood around trachea EXAM: PORTABLE CHEST 1 VIEW COMPARISON:  05/12/2018 FINDINGS: Tracheostomy remains in place, unchanged. Cardiomegaly with mild vascular congestion. Left perihilar opacity again noted. No confluent opacity on the right. No effusions or acute bony abnormality. IMPRESSION: Cardiomegaly with vascular congestion. Left perihilar opacity could reflect asymmetric edema or infection. Electronically Signed   By: Charlett Nose M.D.   On: 07/19/2018 23:22    Procedures Procedures (including critical care time)  Medications Ordered in ED Medications  nystatin (MYCOSTATIN/NYSTOP) topical powder (has no administration in time range)  acetaminophen (TYLENOL) tablet 650 mg (has no administration in time range)    Or  acetaminophen (TYLENOL) suppository 650 mg (has no administration in time range)  senna-docusate (Senokot-S) tablet 1 tablet (has no administration in time range)  allopurinol (ZYLOPRIM) tablet 100 mg (has no administration in time range)  levothyroxine (SYNTHROID, LEVOTHROID) tablet 50 mcg (has no administration in time range)     Initial Impression / Assessment and Plan / ED Course  I have reviewed the triage vital signs and the nursing notes.  Pertinent labs & imaging results that were available during my care of the patient were reviewed by me and considered in my medical decision making (see chart for details).     MDM  67 y.o. 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  Patient denies any infectious like symptoms such as fever, cough, chills, night sweats.  In addition patient indicates that she had few episodes today of mild amount of blood that was noted during suctioning.  Patient denied any shortness of breath, massive  bleeding.  Physical exam noted for no  bleeding at the stoma, clean stoma site.  Bleeding likely secondary due to aggressive suctioning, will obtain chest x-ray at this time.  Do not feel emergent ENT consult necessary.  Patient has benign abdominal exam, no suprapubic tenderness will obtain urinalysis to assess for UTI.  For patient's leg injury will do basic laboratory studies to check platelets and white blood cell count.  Likely wound secondary from injury approximately 1-1/2-week ago, difficult healing likely secondary from patient's habitus.  No signs of infection at this time.  Will x-ray to ensure no fracture.  Patient laboratory studies indicate mildly elevated creatinine, baseline hemoglobin.  Other laboratory studies reviewed, using decision-making regarding management.  X-ray indicates no acute fracture.  We will admit to the inpatient team for continued management due to hemoptysis from the patient's trachea during suctioning, likely mild dehydration, continued wound care management of the right lower extremity.  Inpatient team in agreement with this plan.  Consulted pulmonology.  Patient transferred to inpatient team care in stable condition.  The above care was discussed and agreed upon by my attending physician.  Final Clinical Impressions(s) / ED Diagnoses   Final diagnoses:  Tracheostomy complication, unspecified complication type [facility](HCC)    ED Discharge Orders    None       Dahlia Clientchsenbein, Jazlynne Milliner, MD 07/20/18 Glena [id number]    Dahlia Clientchsenbein, Aundreya Souffrant, MD 07/20/18 Elita [id number]    Long, Joshua G, MD 07/20/18 [phone]

## 2018-07-20 DIAGNOSIS — J9501 Hemorrhage from tracheostomy stoma: Secondary | ICD-10-CM | POA: Diagnosis not present

## 2018-07-20 DIAGNOSIS — W050XXA Fall from non-moving wheelchair, initial encounter: Secondary | ICD-10-CM

## 2018-07-20 DIAGNOSIS — N390 Urinary tract infection, site not specified: Secondary | ICD-10-CM | POA: Diagnosis present

## 2018-07-20 DIAGNOSIS — B372 Candidiasis of skin and nail: Secondary | ICD-10-CM

## 2018-07-20 DIAGNOSIS — Z7901 Long term (current) use of anticoagulants: Secondary | ICD-10-CM

## 2018-07-20 DIAGNOSIS — J9500 Unspecified tracheostomy complication: Secondary | ICD-10-CM | POA: Diagnosis present

## 2018-07-20 DIAGNOSIS — Z6841 Body Mass Index (BMI) 40.0 and over, adult: Secondary | ICD-10-CM | POA: Diagnosis not present

## 2018-07-20 DIAGNOSIS — R3129 Other microscopic hematuria: Secondary | ICD-10-CM | POA: Diagnosis present

## 2018-07-20 DIAGNOSIS — W2203XA Walked into furniture, initial encounter: Secondary | ICD-10-CM | POA: Diagnosis present

## 2018-07-20 DIAGNOSIS — B964 Proteus (mirabilis) (morganii) as the cause of diseases classified elsewhere: Secondary | ICD-10-CM | POA: Diagnosis present

## 2018-07-20 DIAGNOSIS — Z79899 Other long term (current) drug therapy: Secondary | ICD-10-CM

## 2018-07-20 DIAGNOSIS — Y92009 Unspecified place in unspecified non-institutional (private) residence as the place of occurrence of the external cause: Secondary | ICD-10-CM | POA: Diagnosis not present

## 2018-07-20 DIAGNOSIS — J9621 Acute and chronic respiratory failure with hypoxia: Secondary | ICD-10-CM

## 2018-07-20 DIAGNOSIS — Z91040 Latex allergy status: Secondary | ICD-10-CM

## 2018-07-20 DIAGNOSIS — E662 Morbid (severe) obesity with alveolar hypoventilation: Secondary | ICD-10-CM | POA: Diagnosis present

## 2018-07-20 DIAGNOSIS — N170 Acute kidney failure with tubular necrosis: Secondary | ICD-10-CM | POA: Diagnosis present

## 2018-07-20 DIAGNOSIS — I5022 Chronic systolic (congestive) heart failure: Secondary | ICD-10-CM | POA: Diagnosis present

## 2018-07-20 DIAGNOSIS — R042 Hemoptysis: Secondary | ICD-10-CM

## 2018-07-20 DIAGNOSIS — Z9911 Dependence on respirator [ventilator] status: Secondary | ICD-10-CM

## 2018-07-20 DIAGNOSIS — Z886 Allergy status to analgesic agent status: Secondary | ICD-10-CM

## 2018-07-20 DIAGNOSIS — J9612 Chronic respiratory failure with hypercapnia: Secondary | ICD-10-CM

## 2018-07-20 DIAGNOSIS — D62 Acute posthemorrhagic anemia: Secondary | ICD-10-CM

## 2018-07-20 DIAGNOSIS — R319 Hematuria, unspecified: Secondary | ICD-10-CM

## 2018-07-20 DIAGNOSIS — N183 Chronic kidney disease, stage 3 (moderate): Secondary | ICD-10-CM

## 2018-07-20 DIAGNOSIS — J9611 Chronic respiratory failure with hypoxia: Secondary | ICD-10-CM | POA: Diagnosis present

## 2018-07-20 DIAGNOSIS — S8001XA Contusion of right knee, initial encounter: Secondary | ICD-10-CM

## 2018-07-20 DIAGNOSIS — K219 Gastro-esophageal reflux disease without esophagitis: Secondary | ICD-10-CM

## 2018-07-20 DIAGNOSIS — L03115 Cellulitis of right lower limb: Secondary | ICD-10-CM

## 2018-07-20 DIAGNOSIS — M109 Gout, unspecified: Secondary | ICD-10-CM | POA: Diagnosis present

## 2018-07-20 DIAGNOSIS — Z91048 Other nonmedicinal substance allergy status: Secondary | ICD-10-CM

## 2018-07-20 DIAGNOSIS — Z91018 Allergy to other foods: Secondary | ICD-10-CM

## 2018-07-20 DIAGNOSIS — E039 Hypothyroidism, unspecified: Secondary | ICD-10-CM

## 2018-07-20 DIAGNOSIS — Z930 Tracheostomy status: Secondary | ICD-10-CM | POA: Diagnosis not present

## 2018-07-20 DIAGNOSIS — Z79890 Hormone replacement therapy (postmenopausal): Secondary | ICD-10-CM

## 2018-07-20 DIAGNOSIS — G2581 Restless legs syndrome: Secondary | ICD-10-CM | POA: Diagnosis present

## 2018-07-20 DIAGNOSIS — R0902 Hypoxemia: Secondary | ICD-10-CM

## 2018-07-20 DIAGNOSIS — R200 Anesthesia of skin: Secondary | ICD-10-CM | POA: Diagnosis present

## 2018-07-20 DIAGNOSIS — Z86711 Personal history of pulmonary embolism: Secondary | ICD-10-CM

## 2018-07-20 DIAGNOSIS — E860 Dehydration: Secondary | ICD-10-CM | POA: Diagnosis present

## 2018-07-20 DIAGNOSIS — D689 Coagulation defect, unspecified: Secondary | ICD-10-CM | POA: Diagnosis present

## 2018-07-20 DIAGNOSIS — Z8249 Family history of ischemic heart disease and other diseases of the circulatory system: Secondary | ICD-10-CM | POA: Diagnosis not present

## 2018-07-20 DIAGNOSIS — E739 Lactose intolerance, unspecified: Secondary | ICD-10-CM

## 2018-07-20 DIAGNOSIS — Z881 Allergy status to other antibiotic agents status: Secondary | ICD-10-CM

## 2018-07-20 DIAGNOSIS — L304 Erythema intertrigo: Secondary | ICD-10-CM | POA: Diagnosis present

## 2018-07-20 LAB — URINALYSIS, ROUTINE W REFLEX MICROSCOPIC
Bilirubin Urine: NEGATIVE
Glucose, UA: NEGATIVE mg/dL
Ketones, ur: NEGATIVE mg/dL
Nitrite: NEGATIVE
Protein, ur: NEGATIVE mg/dL
SPECIFIC GRAVITY, URINE: 1.015 (ref 1.005–1.030)
pH: 5 (ref 5.0–8.0)

## 2018-07-20 LAB — CREATININE, URINE, RANDOM: Creatinine, Urine: 97.84 mg/dL

## 2018-07-20 LAB — BASIC METABOLIC PANEL
Anion gap: 11 (ref 5–15)
BUN: 74 mg/dL — ABNORMAL HIGH (ref 8–23)
CHLORIDE: 93 mmol/L — AB (ref 98–111)
CO2: 34 mmol/L — ABNORMAL HIGH (ref 22–32)
Calcium: 8.3 mg/dL — ABNORMAL LOW (ref 8.9–10.3)
Creatinine, Ser: 1.63 mg/dL — ABNORMAL HIGH (ref 0.44–1.00)
GFR calc Af Amer: 38 mL/min — ABNORMAL LOW (ref >60)
GFR calc non Af Amer: 33 mL/min — ABNORMAL LOW (ref >60)
Glucose, Bld: 86 mg/dL (ref 70–99)
Potassium: 3.9 mmol/L (ref 3.5–5.1)
Sodium: 138 mmol/L (ref 135–145)

## 2018-07-20 LAB — CBC
HCT: 23.9 % — ABNORMAL LOW (ref 36.0–46.0)
Hemoglobin: 7.1 g/dL — ABNORMAL LOW (ref 12.0–15.0)
MCH: 27.4 pg (ref 26.0–34.0)
MCHC: 29.7 g/dL — ABNORMAL LOW (ref 30.0–36.0)
MCV: 92.3 fL (ref 80.0–100.0)
Platelets: 193 10*3/uL (ref 150–400)
RBC: 2.59 MIL/uL — AB (ref 3.87–5.11)
RDW: 16 % — ABNORMAL HIGH (ref 11.5–15.5)
WBC: 6.8 10*3/uL (ref 4.0–10.5)
nRBC: 0 % (ref 0.0–0.2)

## 2018-07-20 LAB — GLUCOSE, CAPILLARY: Glucose-Capillary: 123 mg/dL — ABNORMAL HIGH (ref 70–99)

## 2018-07-20 LAB — PREPARE RBC (CROSSMATCH)

## 2018-07-20 LAB — MRSA PCR SCREENING: MRSA by PCR: POSITIVE — AB

## 2018-07-20 LAB — HEMOGLOBIN AND HEMATOCRIT, BLOOD
HCT: 25.3 % — ABNORMAL LOW (ref 36.0–46.0)
Hemoglobin: 7.4 g/dL — ABNORMAL LOW (ref 12.0–15.0)

## 2018-07-20 LAB — POC OCCULT BLOOD, ED: Fecal Occult Bld: NEGATIVE

## 2018-07-20 LAB — ABO/RH: ABO/RH(D): O POS

## 2018-07-20 MED ORDER — ALBUTEROL SULFATE (2.5 MG/3ML) 0.083% IN NEBU
2.5000 mg | INHALATION_SOLUTION | Freq: Four times a day (QID) | RESPIRATORY_TRACT | Status: DC | PRN
  Administered 2018-07-21 – 2018-07-22 (×2): 2.5 mg via RESPIRATORY_TRACT
  Filled 2018-07-20 (×2): qty 3

## 2018-07-20 MED ORDER — PANTOPRAZOLE SODIUM 40 MG PO TBEC
40.0000 mg | DELAYED_RELEASE_TABLET | Freq: Every day | ORAL | Status: DC
  Administered 2018-07-20 – 2018-07-22 (×3): 40 mg via ORAL
  Filled 2018-07-20 (×3): qty 1

## 2018-07-20 MED ORDER — LEVOTHYROXINE SODIUM 50 MCG PO TABS
50.0000 ug | ORAL_TABLET | Freq: Every day | ORAL | Status: DC
  Administered 2018-07-20 – 2018-07-22 (×3): 50 ug via ORAL
  Filled 2018-07-20 (×3): qty 1

## 2018-07-20 MED ORDER — MOMETASONE FURO-FORMOTEROL FUM 200-5 MCG/ACT IN AERO
2.0000 | INHALATION_SPRAY | Freq: Two times a day (BID) | RESPIRATORY_TRACT | Status: DC
  Administered 2018-07-21 – 2018-07-22 (×2): 2 via RESPIRATORY_TRACT
  Filled 2018-07-20 (×2): qty 8.8

## 2018-07-20 MED ORDER — ALLOPURINOL 100 MG PO TABS
100.0000 mg | ORAL_TABLET | Freq: Two times a day (BID) | ORAL | Status: DC
  Administered 2018-07-21 – 2018-07-22 (×3): 100 mg via ORAL
  Filled 2018-07-20 (×3): qty 1

## 2018-07-20 MED ORDER — GERHARDT'S BUTT CREAM
TOPICAL_CREAM | Freq: Two times a day (BID) | CUTANEOUS | Status: DC
  Administered 2018-07-20 – 2018-07-22 (×4): via TOPICAL
  Filled 2018-07-20 (×2): qty 1

## 2018-07-20 MED ORDER — ACETAMINOPHEN 650 MG RE SUPP: 650.0000 mg | Freq: Four times a day (QID) | RECTAL | Status: DC | PRN

## 2018-07-20 MED ORDER — CEPHALEXIN 250 MG PO CAPS
500.0000 mg | ORAL_CAPSULE | Freq: Four times a day (QID) | ORAL | Status: DC
  Administered 2018-07-20 (×2): 500 mg via ORAL
  Filled 2018-07-20 (×2): qty 2

## 2018-07-20 MED ORDER — SODIUM CHLORIDE 0.9% IV SOLUTION
Freq: Once | INTRAVENOUS | Status: AC
  Administered 2018-07-20: 05:00:00 via INTRAVENOUS

## 2018-07-20 MED ORDER — SENNOSIDES-DOCUSATE SODIUM 8.6-50 MG PO TABS: 1.0000 | ORAL_TABLET | Freq: Every evening | ORAL | Status: DC | PRN

## 2018-07-20 MED ORDER — FLUCONAZOLE 150 MG PO TABS
150.0000 mg | ORAL_TABLET | ORAL | Status: DC
  Administered 2018-07-20: 150 mg via ORAL
  Filled 2018-07-20 (×2): qty 1

## 2018-07-20 MED ORDER — PRAMIPEXOLE DIHYDROCHLORIDE 0.25 MG PO TABS
1.0000 mg | ORAL_TABLET | Freq: Two times a day (BID) | ORAL | Status: DC
  Administered 2018-07-20 – 2018-07-22 (×5): 1 mg via ORAL
  Filled 2018-07-20 (×2): qty 1
  Filled 2018-07-20: qty 4
  Filled 2018-07-20 (×3): qty 1

## 2018-07-20 MED ORDER — ACETAMINOPHEN 325 MG PO TABS
650.0000 mg | ORAL_TABLET | Freq: Four times a day (QID) | ORAL | Status: DC | PRN
  Administered 2018-07-20 – 2018-07-22 (×4): 650 mg via ORAL
  Filled 2018-07-20 (×4): qty 2

## 2018-07-20 MED ORDER — IPRATROPIUM-ALBUTEROL 0.5-2.5 (3) MG/3ML IN SOLN: 3.0000 mL | Freq: Four times a day (QID) | RESPIRATORY_TRACT | Status: DC | PRN

## 2018-07-20 MED ORDER — CEPHALEXIN 500 MG PO CAPS
500.0000 mg | ORAL_CAPSULE | Freq: Two times a day (BID) | ORAL | Status: DC
  Administered 2018-07-20: 500 mg via ORAL
  Filled 2018-07-20 (×3): qty 1

## 2018-07-20 MED ORDER — NYSTATIN 100000 UNIT/GM EX POWD
Freq: Three times a day (TID) | CUTANEOUS | Status: DC
  Administered 2018-07-21 – 2018-07-22 (×2): via TOPICAL
  Filled 2018-07-20 (×3): qty 15

## 2018-07-20 MED ORDER — FUROSEMIDE 10 MG/ML IJ SOLN: 20.0000 mg | Freq: Once | INTRAMUSCULAR | Status: DC

## 2018-07-20 NOTE — Consult Note (Signed)
NAME:  Rachael Robertson, MRN:  [id number], DOB:  02-14-1951, LOS: 0 ADMISSION DATE:  07/19/2018, CONSULTATION DATE:  07/20/2018 REFERRING MD:  Evie Lacks, CHIEF COMPLAINT:  Hemoptysis   Brief History   68 year old female with chronic trach that uses the vent at night at home for the past 2 years that is presenting with hemoptysis with suction.  Patient has history of morbid obesity, OSA and OHV.  Patient noted that she had bleeding from multiple sites including from the trach with frequent suctioning.  Patient is on eliquis.  No fever, chills, productive cough or frank hemoptysis.  Patient reports it is mostly blood mixed with sputum.  History of present illness   68 year old female with chronic trach that uses the vent at night at home for the past 2 years that is presenting with hemoptysis with suction.  Patient has history of morbid obesity, OSA and OHV.  Patient noted that she had bleeding from multiple sites including from the trach with frequent suctioning.  Patient is on eliquis.  No fever, chills, productive cough or frank hemoptysis.  Patient reports it is mostly blood mixed with sputum.  Past Medical History  OSA OHV [facility] Events   2/11 admission for hemoptysis and respiratory failure  Consults:  PCCM  Procedures:  N/A  Significant Diagnostic Tests:  CXR that I reviewed myself, trach is in a good position, no acute disease noted.  Micro Data:  N/A  Antimicrobials:  N/A   Interim history/subjective:  No complaints, no events  Objective   Blood pressure 121/60, pulse 68, temperature 98.4 F (36.9 C), temperature source Oral, resp. rate 13, height 5' (1.524 m), weight (!) 191 kg, SpO2 98 %.    Vent Mode: Other (Comment) FiO2 (%):  [28 %-40 %] 28 % Set Rate:  [10 bmp] 10 bmp Vt Set:  [500 mL] 500 mL   Intake/Output Summary (Last 24 hours) at 07/20/2018 9038 Last data filed at 07/20/2018 0124 Gross per 24 hour  Intake -  Output 400 ml  Net  -400 ml   Filed Weights   07/19/18 2127  Weight: (!) 191 kg    Examination: General: Chronically ill appearing female, NAD HENT: [place]/AT, PERRL, EOM-I and MMM Lungs: Coarse BS diffusely Cardiovascular: RRR, Nl S1/S2 and -M/R/G Abdomen: Soft, obese, NT, ND and +BS Extremities: -edema and -tenderness Neuro: Alert and interactive, moving all ext to command Skin: Wound noted with bleeding  Resolved Hospital Problem list   N/A  Assessment & Plan:  68 year old female with OHV, OSA and chronic respiratory failure presenting with hemoptysis and bleeding from various sites.  Discussed with PCCM-NP.    Hemoptysis: with suction  - D/C suction  - If become worse then would consider holding eliquis but not necessary for now  Chronic respiratory failure:  - Vent at night at home settings  Hypoxemia:  - Titrate O2 for sat of 88-92%  Trach status:  - Maintain current trach type and size   OHV:  - Vent at night  PCCM will continue to follow for vent/trach management, no need for bronch at this time  Labs   CBC: Recent Labs  Lab 07/19/18 2125 07/20/18 0250  WBC 7.4 6.8  NEUTROABS 5.8  --   HGB 8.1* 7.1*  HCT 26.9* 23.9*  MCV 91.2 92.3  PLT 203 193    Basic Metabolic Panel: Recent Labs  Lab 07/19/18 2125 07/20/18 0250  NA 139 138  K  4.1 3.9  CL 92* 93*  CO2 34* 34*  GLUCOSE 128* 86  BUN 76* 74*  CREATININE 1.94* 1.63*  CALCIUM 8.4* 8.3*   GFR: Estimated Creatinine Clearance: 56.3 mL/min (A) (by C-G formula based on SCr of 1.63 mg/dL (H)). Recent Labs  Lab 07/19/18 2125 07/20/18 0250  WBC 7.4 6.8    Liver Function Tests: Recent Labs  Lab 07/19/18 2125  AST 20  ALT 20  ALKPHOS 82  BILITOT 0.6  PROT 7.5  ALBUMIN 2.6*   No results for input(s): LIPASE, AMYLASE in the last 168 hours. No results for input(s): AMMONIA in the last 168 hours.  ABG    Component Value Date/Time   PHART 7.393 06/11/2018 1100   PCO2ART 69.7 (HH) 06/11/2018 1100   PO2ART  69.1 (L) 06/11/2018 1100   HCO3 41.6 (H) 06/11/2018 1100   O2SAT 94.3 06/11/2018 1100     Coagulation Profile: No results for input(s): INR, PROTIME in the last 168 hours.  Cardiac Enzymes: No results for input(s): CKTOTAL, CKMB, CKMBINDEX, TROPONINI in the last 168 hours.  HbA1C: No results found for: HGBA1C  CBG: No results for input(s): GLUCAP in the last 168 hours.  Review of Systems:   Negative other than above  Past Medical History  She,  has a past medical history of Obesity, Opiate use, and Tracheostomy in place [facility]).   Surgical History    Past Surgical History:  Procedure Laterality Date  . Perianal abscess    . TRACHELECTOMY       Social History   reports that she is a non-smoker but has been exposed to tobacco smoke. She has never used smokeless tobacco. She reports current alcohol use.   Family History   Her family history includes Aneurysm in her mother; Breast cancer in her sister; Heart disease in her father; Skin cancer in her father; Sleep apnea in her father.   Allergies Allergies  Allergen Reactions  . Other Anaphylaxis    Melons  . Fruit & Vegetable Daily [Nutritional Supplements] Diarrhea    Have to be cooked or vine-ripened  . Lactose Intolerance (Gi) Diarrhea  . Naproxen Other (See Comments)    Makes her "congested"  . Soy Allergy Diarrhea  . Sulfa Antibiotics Hives  . Erythromycin Hives and Rash  . Latex Rash  . Mupirocin Rash  . Tape Rash and Other (See Comments)    Plastic tape (alo) BURNS, but Tegaderm is tolerated     Home Medications  Prior to Admission medications   Medication Sig Start Date End Date Taking? Authorizing Provider  acetaminophen (TYLENOL) 650 MG CR tablet Take 1,300 mg by mouth 2 (two) times daily.   Yes [provider]  albuterol (PROVENTIL HFA;VENTOLIN HFA) 108 (90 Base) MCG/ACT inhaler Inhale 1-2 puffs into the lungs every 6 (six) hours as needed for wheezing or shortness of breath.   Yes [provider]  allopurinol (ZYLOPRIM) 100 MG tablet Take 100 mg by mouth 2 (two) times daily.   Yes [provider]  budesonide-formoterol (SYMBICORT) 160-4.5 MCG/ACT inhaler Inhale 2 puffs into the lungs 2 (two) times daily.   Yes [provider]  bumetanide (BUMEX) 2 MG tablet Take 4 mg by mouth daily.    Yes [provider]  carvedilol (COREG) 3.125 MG tablet Take 3.125 mg by mouth See admin instructions. Take 3.125 mg by mouth two times a day with meals ONLY IF SYSTOLIC NUMBER IS 100 OR GREATER   Yes [provider]  diclofenac sodium (VOLTAREN) 1 % GEL Apply 2 g topically 4 (four) times daily. Patient taking differently: Apply 2-4 g topically See admin instructions. Apply 2-4 grams topically to knees two times a day 05/20/18  Yes Rehman, Areeg N, DO  levothyroxine (SYNTHROID, LEVOTHROID) 50 MCG tablet Take 50 mcg by mouth daily before breakfast.   Yes [provider]  omeprazole (PRILOSEC) 20 MG capsule Take 20 mg by mouth daily.   Yes [provider]  potassium chloride SA (K-DUR,KLOR-CON) 20 MEQ tablet Take 40 mEq by mouth daily.    Yes [provider]  pramipexole (MIRAPEX) 1 MG tablet Take 1 mg by mouth 2 (two) times daily.   Yes [provider]  levofloxacin (LEVAQUIN) 500 MG tablet Take 1 tablet (500 mg total) by mouth daily. 07/14/18   Tomma Lightninglalere, Adewale A, MD  rivaroxaban (XARELTO) 20 MG TABS tablet Take 1 tablet (20 mg total) by mouth daily with supper. 05/17/18   [name], Joshua K, MD  Rivaroxaban 15 & 20 MG TBPK Take as directed on package: Start with one 15mg  tablet by mouth twice a day with food. On Day 22, switch to one 20mg  tablet once a day with food. Patient not taking: Reported on 07/19/2018 07/12/18   Tomma Lightninglalere, Adewale A, MD  silver sulfADIAZINE (SILVADENE) 1 % cream Apply topically daily. 07/06/18   Chundi, Sherlyn LeesVahini, MD  spironolactone (ALDACTONE) 100 MG tablet Take 100 mg by mouth daily.    [provider]    traMADol (ULTRAM) 50 MG tablet Take 1 tablet (50 mg total) by mouth every 6 (six) hours as needed for moderate pain. 07/09/18   Fayrene Helperran, [place], PA-C    Alyson ReedyWesam G. Cieanna Stormes, M.D. [facility]. Pager: [phone]. After hours pager: [phone].

## 2018-07-20 NOTE — ED Notes (Signed)
Meal tray given to patient.

## 2018-07-20 NOTE — Progress Notes (Signed)
Patient taken off of AVAPS and placed on trach collar.  Patient suctioned herself and was able to obtain a moderate amount of thick, white secretions.  Patient tolerating trach collar well.  Will continue to monitor.

## 2018-07-20 NOTE — ED Notes (Signed)
Breakfast Tray Ordered. 

## 2018-07-20 NOTE — ED Notes (Signed)
Messaged main pharmacy for needed mediations for pt

## 2018-07-20 NOTE — Progress Notes (Signed)
Pt goes on vent at night. Home vent settings are: AVAPS/500/10/Min-5/Max 25 Pt tolerating settings at this time. RT will continue to monitor.

## 2018-07-20 NOTE — Consult Note (Signed)
WOC Nurse wound consult note Reason for Consult:Nonhealing wound to right anterior and  lateral leg.  Intertriginous dermatitis to abdominal pannus and inframammary areas.  All present on admission.  Wound type: trauma to right leg Moisture associated skin damage/intertriginous dermatitis to skin folds.  Pressure Injury POA: NA Measurement: Right leg:  12 cm x 8 cm x 0.2 cm  Scattered nonintact lesions to skin folds, extends throughout with erythema and weeping. Wound bed:red Drainage (amount, consistency, odor) serosanguinous weeping Periwound: Edema to bilateral lower legs. Dressing procedure/placement/frequency: Cleanse right leg wound with NS.  Apply Aquacel Ag to wound bed. ([place]# [id number])  Cover with gauze and kerlix. Change Tues and Friday. Interdry Ag to abdominal pannus and beneath breasts:  Measure and cut length of InterDry Ag+ to fit in skin folds that have skin breakdown Tuck InterDry  Ag+ fabric into skin folds in a single layer, allow for 2 inches of overhang from skin edges to allow for wicking to occur May remove to bathe; dry area thoroughly and then tuck into affected areas again Do not apply any creams or ointments when using InterDry Ag+ DO NOT THROW AWAY FOR 5 DAYS unless soiled with stool DO NOT [facility] product, this will inactivate the silver in the material  New sheet of Interdry Ag+ should be applied after 5 days of use if patient continues to have skin breakdown  Will not follow at this time.  Please re-consult if needed.  Maple Hudson MSN, RN, FNP-BC CWON Wound, Ostomy, Continence Nurse Pager [phone]

## 2018-07-20 NOTE — Progress Notes (Signed)
Subjective: She reports that she was doing okay today. She reports that she has some light headedness when she stands up. She has a service that comes in to help her at home and she has a niece that helps. She is on the vent at night and oxygen during the day at home. She did not bring her vent with her to the hospital. She had some hemoptysis last night, she reported that it was not a lot. She reports that her legs started leaking and her skin is breaking down. She states that she has break down of the skin on her right inner thigh for which she uses silvadene cream at home. She recently restarted her Xarelto. She denied any pain in her knee, but she does endorse some numbness. The injury occurred when she was trying to sit back down in her wheelchair and fell down on her right knee. A few days later she ran into a sofa with her right knee. She is not very mobile at home. She can ambulate from her bed to her chair or the commode. She also has a chronic right-sided abdominal mass from a prior hematoma from [place] injections. All her questions were answered.   Objective:  Vital signs in last 24 hours: Vitals:   07/20/18 0600 07/20/18 0651 07/20/18 0700 07/20/18 0717  BP:  (!) 113/55 (!) 107/36 (!) 107/40  Pulse:  65 62 68  Resp: 16 17 17 17   Temp:  98.4 F (36.9 C)    TempSrc:  Oral    SpO2:  100% 100% 99%  Weight:      Height:       General: Obese female, NAD, tracheostomy in place on oxygen 5L Cardiac: Distant heart sounds Pulmonary: Distant breath sounds Abd: Soft, non-distended, non-tender. Right-sided subcutaneous mass consistent with chronic hematoma. Extremity: The right knee is markedly swollen with purplish skin changes consistent with a large hematoma. There is skin breakdown on the patella with scant serosanguinous drainage and frank blood drainage. The right leg is edematous, erythematous compared to the left, and has increased warmth. It is not tender.  Skin: There is erythema and  scale in the skin folds of the abdomen and groin consistent with candidiasis.   Assessment/Plan:  Active Problems:   Hemoptysis  Ms. 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 Her Xarelto had been held for about two months after she had hemoptysis in 05/2018.  Hemoptysis Right knee hematoma  Hematuria Acute blood loss anemia Chronic anticoagulation due to PE  - Denies further episodes of hemoptysis since arriving to the hospital - The hematoma on her right knee is very large and likely represents the majority of her blood loss compared to her hemoptysis - UA on admission showed microscopic hematuria with 6-10 RBCs and no signs of infection - Hb dropped from 8.1 on admission to 7.1. - Spoke with PCCM regarding anticoagulation. From their perspective, her hemoptysis is stable and anticoagulation does not need to be held from a respiratory stance. Will continue to hold Xarelto given the active bleeding into her right knee. If the primary team decides to discontinue anticoagulation altogether given her bleeding complications while on Xarelto, PCCM recommends IVC filter.  Plan - Hold Xarelto - Transfuse  1 unit RBCs - F/u post-transfusion H+H - Pulm consulted, appreciate recs  - F/u urine culture - If hematuria does not resolve on outpatient follow-up, she may benefit from a CT abdomen pelvis - Continue SCDs for VTE ppx  Nonpurulent cellulitis - There is erythema and increased warmth of the right leg distal to the hematoma. This may represent inflammation in the setting of large hematoma and Hb resorption. Or this could be a cellulitis. At the very least, she is high risk for infection in the leg given the skin  breakdown.  - She is afebrile without leukocytosis Plan - Continue Keflex 500 mg BID (renally dosed) for 5 days   Chronic HFrEF - Difficult to assess volume status due to body habitus, but patient denies dyspnea. No concern for acute exacerbation at this time. Plan - Continue cardiac monitoring - Strict intake and output - Hold spironolactone and Bumex in the setting of AKI - Hold Coreg in setting of soft BP  AKI on CKDIII - Likely prerenal in the setting of acute blood loss - Cr improved from 1.9 to 1.63 (about baseline) Plan - F/u urine urea to calculate FeUrea - Hold spironolactone and Bumex  History of chronic hypercapnic respiratory failure Obesity hypoventilation syndrome Obstructive sleep apnea Chronic trach - No dyspnea presently. Adherent with ventilator at home.  Plan - PCCM consulted, appreciate recs - RT consulted, appreciate trach care - Supplemental oxygen by trach collar during the day - Continue home ventilator settings at night  - O2 saturation goal: 88-92% - Continue albuterol, Dulera  Candidal Intertrigo  - Has failed topical therapy Plan - Continue topical nystatin  - Fluconazole 150mg  weekly for 4 weeks - Consult wound care for antimicrobial skin dressings, appreciate recs  Hypothyroidism: Continue Synthroid 50 mcg daily GERD: Continue home PPI Gout: Continue allopurinol  Dispo: Anticipated discharge in approximately 1-2 days.   Wonda Goodgame, [name], MD 07/20/2018, 7:43 AM Pager: [phone]

## 2018-07-20 NOTE — ED Notes (Signed)
Dinner tray ordered.

## 2018-07-20 NOTE — ED Notes (Signed)
Patient tolerating blood transfusion well and denies any signs of reaction. Will continue to monitor.

## 2018-07-20 NOTE — ED Notes (Addendum)
Admitting doctor rounding team at the bedside

## 2018-07-20 NOTE — Telephone Encounter (Signed)
Received refill request of levaquin abx from pharmacy.  Called and spoke with pt in regards to the refill and pt stated to me on 07/14/2018 when she called stating that she was not feeling well, when the abx was originally sent in pt was not able to pick it up. Pt stated to me due to that and due to unable to request Rx to be mailed to her house by pharmacy, pharmacy resent the refill request to our office.  While I was discussing the refill request of the levaquin to pt, pt stated to me that she was currently in the hospital due to falling and stated to me that they have placed her on an abx while she is in the hospital. I stated to pt due to her currently being in the hospital and on an abx, that we will not send the levaquin abx to pharmacy. Pt expressed understanding. Nothing further needed.

## 2018-07-20 NOTE — H&P (Addendum)
Date: 07/20/2018               Patient Name:  Rachael Robertson MRN: [id number]  DOB: 1950-10-23 Age / Sex: 68 y.o., female   PCP: Lorenso Courierhundi, Vahini, MD         Medical Service: Internal Medicine Teaching Service         Attending Physician: Dr. Inez CatalinaMullen, Emily B, MD    First Contact: Dr. Frances FurbishWinfrey Pager: [phone]  Second Contact: Dr. 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  Rachael Robertson denied chest pain, shortness of breath, worsening cough as Rachael Robertson has baseline intermittent cough, fevers, recent travel history, upper respiratory viral symptoms, calf pain or swelling. Rachael Robertson was recently admitted in November 2019 with similar complaints of hemoptysis though this self resolved during her admission.  Rachael Robertson had been off her Xarelto ever since however this was restarted after Rachael Robertson saw a pulmonologist on July 12, 2018.  Rachael Robertson also reports of right knee bruising after hitting her knee against furniture at home.  Since then, Rachael Robertson has had worsening erythema around the right knee as well as skin irritation at the right thigh, underneath her breasts and arms.  Rachael Robertson gives me history of what appears to be hematuria as Rachael Robertson reports that her caregiver this morning noted blood while changing her purewick.  ED course: Afebrile, HR 70, RR 10-20s, BP 123/48, SPO2 100% on tracheostomy collar.  CBC shows hemoglobin of 8.1, CMP BUN/Cr 76/1.94.   Meds:    Current Meds  Medication Sig  . acetaminophen (TYLENOL) 650 MG CR tablet Take 1,300 mg by mouth 2 (two) times daily.  Marland Kitchen. albuterol (PROVENTIL HFA;VENTOLIN HFA) 108 (90 Base) MCG/ACT inhaler Inhale 1-2 puffs into the lungs every 6 (six) hours as needed for wheezing or shortness of breath.  . allopurinol (ZYLOPRIM) 100 MG tablet Take 100 mg by mouth 2 (two) times daily.  . budesonide-formoterol (SYMBICORT) 160-4.5 MCG/ACT inhaler Inhale 2 puffs into the lungs 2 (two) times daily.  . bumetanide (BUMEX) 2 MG tablet Take 4 mg by mouth daily.   . carvedilol (COREG) 3.125 MG tablet Take 3.125 mg by mouth See admin instructions. Take 3.125 mg by mouth two times a day with meals ONLY IF SYSTOLIC NUMBER IS 100 OR GREATER  . diclofenac sodium (VOLTAREN) 1 % GEL Apply 2 g topically 4 (four) times daily. (Patient taking differently: Apply 2-4 g topically See admin instructions. Apply 2-4 grams topically to knees two times a day)  . levothyroxine (SYNTHROID, LEVOTHROID) 50 MCG tablet Take 50 mcg by mouth daily before breakfast.  . omeprazole (PRILOSEC) 20 MG capsule Take 20 mg by mouth daily.  . potassium chloride SA (K-DUR,KLOR-CON) 20 MEQ tablet Take 40 mEq by mouth daily.   . pramipexole (MIRAPEX) 1 MG tablet Take 1 mg by mouth 2 (two) times daily.     Allergies: Allergies as of 07/19/2018 - Review Complete 07/19/2018  Allergen Reaction Noted  . Other Anaphylaxis 07/19/2018  .  Fruit & vegetable daily [nutritional supplements] Diarrhea 07/19/2018  . Lactose intolerance (gi) Diarrhea 07/19/2018  . Naproxen Other (See Comments) 05/03/2018  . Soy allergy Diarrhea 07/19/2018  . Sulfa antibiotics Hives 05/03/2018  . Erythromycin Hives and Rash 05/03/2018  . Latex Rash 07/19/2018  . Mupirocin Rash 07/19/2018  . Tape Rash and Other (See Comments) 07/19/2018   Past Medical History:  Diagnosis Date  . Obesity   . Opiate use   . Tracheostomy in place [facility])     Family History: Father with heart  disease and sleep apnea  Social History: Denies current use of cigarettes alcohol or illicit drugs.  Rachael Robertson lives at home by herself however has caregivers that comes in the morning and evening.  Review of Systems: A complete ROS was negative except as per HPI.   Physical Exam: Blood pressure (!) 107/39, pulse 60, temperature 98.2 F (36.8 C), temperature source Oral, resp. rate 17, height 5' (1.524 m), weight (!) 191 kg, SpO2 100 %. Physical Exam Vitals signs and nursing note reviewed.  Constitutional:      General: Rachael Robertson is not in acute distress.    Appearance: Rachael Robertson is obese. Rachael Robertson is not ill-appearing, toxic-appearing or diaphoretic.  HENT:     Head: Normocephalic and atraumatic.     Mouth/Throat:     Comments: Tracheostomy in place.  Site is clean and dressed appropriately. Eyes:     Conjunctiva/sclera: Conjunctivae normal.  Neck:     Musculoskeletal: Neck supple.  Cardiovascular:     Rate and Rhythm: Normal rate.     Pulses: Normal pulses.     Heart sounds: Normal heart sounds. No murmur. No friction rub. No gallop.   Pulmonary:     Effort: Pulmonary effort is normal. No respiratory distress.     Breath sounds: Normal breath sounds. No wheezing, rhonchi or rales.     Comments: Breath sounds unremarkable anteriorly without wheezes, crackles, rhonchi. Abdominal:     General: Bowel sounds are normal. There is no distension.     Tenderness: There is no abdominal tenderness.  Skin:    Findings: Bruising (right knee), erythema and lesion present.  Neurological:     General: No focal deficit present.     Mental Status: Rachael Robertson is oriented to person, place, and time.  Psychiatric:        Mood and Affect: Mood normal.        Behavior: Behavior normal.   Skin:    CXR: Suggests vascular congestion though my suspicion is low.  Assessment & Plan by Problem: Active Problems:   Hemoptysis  Rachael Robertson is a 68 year old morbidly obese woman with chronic hypercapnia secondary to OHS, OSA  on chronic trach (usually on 2 L supplemental O2 via trilogy ventilator), tracheal spasm, HFrEF EF 45-50%, CKD III, history of PE on Xarelto, Chronic skin bruising presenting with hemoptysis.  #Hemoptysis: Patient has a history of low volume hemoptysis in the setting of chronic anticoagulant (Xarelto) use which self resolved after medication was held.  Now presents with 1 day history of low volume intermittent hemoptysis after resuming Xarelto on 07/12/2018.  Denies acute onset shortness of breath, tachycardia, cough, upper respiratory viral syndrome, fevers, chills medication pulmonary embolism, bronchitis, pneumonia, heart failure exacerbation less likely. Wells score for PE 4 (moderate risk), Geneva score 5 (moderate risk). Chart review, Rachael Robertson has a history of tracheal spasm which could be possible etiology. - Hold Xarelto - Might require workup for IVC placement - Continue to monitor clinically  #Nonpurulent cellulitis:  Rachael Robertson has significant area of erythema at the right knee after sustaining localized trauma.  Area is warm to touch however nontender to palpation.  Rachael Robertson remains afebrile and without leukocytosis. - Continue Keflex 500 mg q6 hour - Continue to monitor for signs of systemic infection and titrate abx appropriately.  #HFrEF, low suspicion for acute exacerbation - Previous echocardiogram showed EF 45 to 50% with diffuse hypokinesis. Her volume status difficult to assess due to body habitus. Discharge weight 2 months ago 206kg (Now 191 kg).  - Chest x-ray suspicious for pulmonary vascular congestion though currently patient has no symptoms of dyspnea.  Vascular congestion could be secondary to chronic pulmonary hypertension from chronic OSA/OHS - Continue cardiac monitoring - Strict intake and output - Hold spironolactone and Bumex - Hold Coreg in setting of soft BP  #Hematuria -Reports that caregiver noticed blood on PureWick.  Denies signs or symptoms of urinary tract infection or signs  and symptoms of nephrolithiasis.  This is most likely secondary to anticoagulation use. -Hemoglobin 7.1<<8.1 (baseline ~10) -Will transfuse 1 unit of blood -Urinalysis without pyuria but does show microscopic hematuria -Follow-up urine culture and hold anticoagulation. -Follow-up CBC -If if hematuria persists, can consider CT hematuria protocol.  #AoCKD #ATN, suspecting dehydration  -- Hx of CKD III -- sCr on arrival 1.9 (Baseline 1.6) -- Serum BUN/creatinine does indicate prerenal azotemia. -- UA shows hyaline casts -- Obtaining random urine creatinine and urea. -- Hold spironolactone and Bumex  #History of chronic hypercapnic respiratory failure #Obesity hypoventilation syndrome #Obstructive sleep apnea - Denies shortness of breath and endorses compliance to home ventilator.  On physical exams Rachael Robertson is alert and oriented x3 without signs of altered mental status. - Will resume nighttime ventilator per respiratory - Continue albuterol, Dulera  #Hypothyroidism -Continue Synthroid 50 mcg daily  #GERD - Can resume omeprazole  #Gout -Continue allopurinol  #Intertrigo  -Continue topical nystatin   FEN: Place electrolytes as needed, heart healthy diet VTE ppx: SCDs CODE STATUS: Full code  Dispo: Admit patient to Observation with expected length of stay less than 2 midnights.  Signed: Yvette RackAgyei,  K, MD 07/20/2018, 12:42 AM  Pager: [phone] IMTS PGY-1

## 2018-07-21 DIAGNOSIS — X58XXXA Exposure to other specified factors, initial encounter: Secondary | ICD-10-CM

## 2018-07-21 DIAGNOSIS — N179 Acute kidney failure, unspecified: Secondary | ICD-10-CM

## 2018-07-21 DIAGNOSIS — B9689 Other specified bacterial agents as the cause of diseases classified elsewhere: Secondary | ICD-10-CM

## 2018-07-21 DIAGNOSIS — G2581 Restless legs syndrome: Secondary | ICD-10-CM

## 2018-07-21 DIAGNOSIS — N3091 Cystitis, unspecified with hematuria: Secondary | ICD-10-CM

## 2018-07-21 LAB — BPAM RBC
Blood Product Expiration Date: 202003102359
ISSUE DATE / TIME: 202002110701
Unit Type and Rh: 5100

## 2018-07-21 LAB — BASIC METABOLIC PANEL
Anion gap: 8 (ref 5–15)
BUN: 54 mg/dL — ABNORMAL HIGH (ref 8–23)
CO2: 40 mmol/L — ABNORMAL HIGH (ref 22–32)
Calcium: 8.2 mg/dL — ABNORMAL LOW (ref 8.9–10.3)
Chloride: 91 mmol/L — ABNORMAL LOW (ref 98–111)
Creatinine, Ser: 1.24 mg/dL — ABNORMAL HIGH (ref 0.44–1.00)
GFR calc Af Amer: 53 mL/min — ABNORMAL LOW (ref >60)
GFR calc non Af Amer: 46 mL/min — ABNORMAL LOW (ref >60)
GLUCOSE: 99 mg/dL (ref 70–99)
Potassium: 3.7 mmol/L (ref 3.5–5.1)
Sodium: 139 mmol/L (ref 135–145)

## 2018-07-21 LAB — TYPE AND SCREEN
ABO/RH(D): O POS
ANTIBODY SCREEN: NEGATIVE
Unit division: 0

## 2018-07-21 LAB — CBC
HCT: 25.5 % — ABNORMAL LOW (ref 36.0–46.0)
Hemoglobin: 7.4 g/dL — ABNORMAL LOW (ref 12.0–15.0)
MCH: 26.7 pg (ref 26.0–34.0)
MCHC: 29 g/dL — ABNORMAL LOW (ref 30.0–36.0)
MCV: 92.1 fL (ref 80.0–100.0)
Platelets: 175 10*3/uL (ref 150–400)
RBC: 2.77 MIL/uL — ABNORMAL LOW (ref 3.87–5.11)
RDW: 15.8 % — ABNORMAL HIGH (ref 11.5–15.5)
WBC: 5.4 10*3/uL (ref 4.0–10.5)
nRBC: 0 % (ref 0.0–0.2)

## 2018-07-21 LAB — GLUCOSE, CAPILLARY
Glucose-Capillary: 118 mg/dL — ABNORMAL HIGH (ref 70–99)
Glucose-Capillary: 82 mg/dL (ref 70–99)

## 2018-07-21 LAB — UREA NITROGEN, URINE: Urea Nitrogen, Ur: 852 mg/dL

## 2018-07-21 MED ORDER — CEPHALEXIN 500 MG PO CAPS
500.0000 mg | ORAL_CAPSULE | Freq: Four times a day (QID) | ORAL | Status: DC
  Filled 2018-07-21: qty 1

## 2018-07-21 MED ORDER — CHLORHEXIDINE GLUCONATE CLOTH 2 % EX PADS
6.0000 | MEDICATED_PAD | Freq: Every day | CUTANEOUS | Status: DC
  Administered 2018-07-21: 6 via TOPICAL

## 2018-07-21 MED ORDER — CEFDINIR 300 MG PO CAPS
300.0000 mg | ORAL_CAPSULE | Freq: Two times a day (BID) | ORAL | Status: DC
  Administered 2018-07-21 (×2): 300 mg via ORAL
  Filled 2018-07-21 (×4): qty 1

## 2018-07-21 MED ORDER — WHITE PETROLATUM EX OINT
TOPICAL_OINTMENT | CUTANEOUS | Status: AC
  Filled 2018-07-21: qty 28.35

## 2018-07-21 MED ORDER — SPIRONOLACTONE 25 MG PO TABS
100.0000 mg | ORAL_TABLET | Freq: Every day | ORAL | Status: DC
  Administered 2018-07-21 – 2018-07-22 (×2): 100 mg via ORAL
  Filled 2018-07-21 (×2): qty 4

## 2018-07-21 MED ORDER — WHITE PETROLATUM EX OINT: TOPICAL_OINTMENT | CUTANEOUS | Status: DC | PRN

## 2018-07-21 NOTE — Progress Notes (Signed)
PHARMACY NOTE:  ANTIMICROBIAL RENAL DOSAGE ADJUSTMENT  Current antimicrobial regimen includes a mismatch between antimicrobial dosage and estimated renal function.  As per policy approved by the Pharmacy & Therapeutics and Medical Executive Committees, the antimicrobial dosage will be adjusted accordingly.  Current antimicrobial dosage:  Cephalexin 500mg  PO q12h  Indication: Cellulitis  Renal Function:  Estimated Creatinine Clearance: 74.1 mL/min (A) (by C-G formula based on SCr of 1.24 mg/dL (H)). []      On intermittent HD, scheduled: []      On CRRT    Antimicrobial dosage has been changed to:  Cephalexin 500mg  PO q6h  Thank you for allowing pharmacy to be a part of this patient's care.  Roderic Scarce Zigmund Daniel, PharmD, BCPS PGY2 Infectious Diseases Pharmacy Resident Phone: [phone] 07/21/2018 7:28 AM

## 2018-07-21 NOTE — Progress Notes (Signed)
NAME:  Rachael Robertson, MRN:  [id number], DOB:  January 31, 1951, LOS: 1 ADMISSION DATE:  07/19/2018, CONSULTATION DATE:  07/20/2018 REFERRING MD:  Evie Lacks, CHIEF COMPLAINT:  Hemoptysis   Brief History   68 year old female with chronic trach that uses the vent at night at home for the past 2 years that is presenting with hemoptysis with suction.  Patient has history of morbid obesity, OSA and OHV.  Patient noted that she had bleeding from multiple sites including from the trach with frequent suctioning.  Patient is on eliquis.  No fever, chills, productive cough or frank hemoptysis.  Patient reports it is mostly blood mixed with sputum.  Past Medical History  OSA, OHV, Trach  Significant Hospital Events   2/11 admission for hemoptysis and respiratory failure  Consults:  PCCM  Procedures:  N/A  Significant Diagnostic Tests:  CXR that I reviewed myself, trach is in a good position, no acute disease noted.  Micro Data:  N/A  Antimicrobials:  N/A   Interim history/subjective:  No complaints, no events  Objective   Blood pressure (Abnormal) 99/26, pulse 67, temperature 99.2 F (37.3 C), temperature source Oral, resp. rate 18, height 5' (1.524 m), weight (Abnormal) 191.2 kg, SpO2 93 %.    FiO2 (%):  [28 %-40 %] 28 % Set Rate:  [10 bmp] 10 bmp Vt Set:  [500 mL] 500 mL   Intake/Output Summary (Last 24 hours) at 07/21/2018 0940 Last data filed at 07/21/2018 0000 Gross per 24 hour  Intake 240 ml  Output 250 ml  Net -10 ml   Filed Weights   07/19/18 2127 07/21/18 0600  Weight: (Abnormal) 191 kg (Abnormal) 191.2 kg    Examination:  Resolved Hospital Problem list   N/A  Assessment & Plan:  68 year old female with OHV, OSA and chronic respiratory failure presenting with hemoptysis and bleeding from various sites.    Would favor simply suction trauma in the setting of DOAC, she has a chronic cough at baseline. Plan Continue to hold anticoagulation for now Limit  suctioning Humidified oxygen during day Continue nocturnal ventilation at night Obtain sputum culture for completeness  Urinary tract infection and cellulitis -Currently growing GNR's in urine Plan Will change abx to ceftinir oral X 7d total   All other issues per primary service.    Simonne Martinet ACNP-BC [facility] Pulmonary/Critical Care Pager # [phone] OR # [phone] if no answer     ATTESTATION & SIGNATURE   STAFF NOTE: I, Dr Lavinia Sharps have personally reviewed patient's available data, including medical history, events of note, physical examination and test results as part of my evaluation. I have discussed with resident/NP and other care providers such as pharmacist, RN and RRT.  In addition,  I personally evaluated patient and elicited key findings of   S: Date of admit 07/19/2018 with LOS 1 for today 07/21/2018 : Rachael Robertson is  Currently without hemoptysis all day  O: obese In bed  Eating Talking No distess   LABS    PULMONARY No results for input(s): PHART, PCO2ART, PO2ART, HCO3, TCO2, O2SAT in the last 168 hours.  Invalid input(s): PCO2, PO2  CBC Recent Labs  Lab 07/19/18 2125 07/20/18 0250 07/20/18 1918 07/21/18 0610  HGB 8.1* 7.1* 7.4* 7.4*  HCT 26.9* 23.9* 25.3* 25.5*  WBC 7.4 6.8  --  5.4  PLT 203 193  --  175    COAGULATION No results for input(s): INR in the last 168 hours.  CARDIAC  No results for input(s): TROPONINI in the last 168 hours. No results for input(s): PROBNP in the last 168 hours.   CHEMISTRY Recent Labs  Lab 07/19/18 2125 07/20/18 0250 07/21/18 0610  NA 139 138 139  K 4.1 3.9 3.7  CL 92* 93* 91*  CO2 34* 34* 40*  GLUCOSE 128* 86 99  BUN 76* 74* 54*  CREATININE 1.94* 1.63* 1.24*  CALCIUM 8.4* 8.3* 8.2*   Estimated Creatinine Clearance: 74.1 mL/min (A) (by C-G formula based on SCr of 1.24 mg/dL (H)).   LIVER Recent Labs  Lab 07/19/18 2125  AST 20  ALT 20  ALKPHOS 82  BILITOT 0.6  PROT 7.5  ALBUMIN  2.6*     INFECTIOUS No results for input(s): LATICACIDVEN, PROCALCITON in the last 168 hours.   ENDOCRINE CBG (last 3)  Recent Labs    07/20/18 1718 07/20/18 1958 07/21/18 0000  GLUCAP 82 123* 118*         IMAGING x24h  - image(s) personally visualized  -   highlighted in bold No results found.    A:  Chronic resp failure - hemoptysis via trach - mechanical injury + anticoag  P: as outline by NP   Anti-infectives (From admission, onward)   Start     Dose/Rate Route Frequency Ordered Stop   07/21/18 1200  cephALEXin (KEFLEX) capsule 500 mg  Status:  Discontinued     500 mg Oral Every 6 hours 07/21/18 0728 07/21/18 0949   07/21/18 1000  cefdinir (OMNICEF) capsule 300 mg     300 mg Oral Every 12 hours 07/21/18 0949 07/27/18 0959   07/20/18 1800  cephALEXin (KEFLEX) capsule 500 mg  Status:  Discontinued     500 mg Oral Every 12 hours 07/20/18 1042 07/21/18 0728   07/20/18 1300  fluconazole (DIFLUCAN) tablet 150 mg     150 mg Oral Weekly 07/20/18 1053     07/20/18 0100  cephALEXin (KEFLEX) capsule 500 mg  Status:  Discontinued     500 mg Oral Every 6 hours 07/20/18 0049 07/20/18 1042       Rest per NP/medical resident whose note is outlined above and that I agree with     Dr. Kalman Shan, M.D., [facility].C.P Pulmonary and Critical Care Medicine Staff Physician  System [place] Pulmonary and Critical Care Pager: [phone], If no answer or between  15:00h - 7:00h: call [phone]  07/21/2018 12:56 PM

## 2018-07-21 NOTE — Consult Note (Signed)
WOC Nurse wound consult note Reason for Consult:New consult placed for areas that were assessed and addressed in the ED  yesterday.  Please see WOC consult note and orders.  No changes at this time.  Wound type:Trauma to right lower leg/knee and MASD to skin folds.  Pressure Injury POA: /NA Dressing procedure/placement/frequency: Aquacel Ag ordered for right leg and Interdry Ag to skin folds.  Measure and cut length of InterDry Ag+ to fit in skin folds that have skin breakdown Tuck InterDry  Ag+ fabric into skin folds in a single layer, allow for 2 inches of overhang from skin edges to allow for wicking to occur May remove to bathe; dry area thoroughly and then tuck into affected areas again Do not apply any creams or ointments when using InterDry Ag+ DO NOT THROW AWAY FOR 5 DAYS unless soiled with stool DO NOT [facility] product, this will inactivate the silver in the material  New sheet of Interdry Ag+ should be applied after 5 days of use if patient continues to have skin breakdown  Will not follow at this time.  Please re-consult if needed.  Maple HudsonKaren Dorissa Stinnette MSN, RN, FNP-BC CWON Wound, Ostomy, Continence Nurse Pager [phone]

## 2018-07-21 NOTE — Progress Notes (Signed)
[facility]  Patient Status: Active (receiving services up to time of hospitalization)  [facility] is providing the following services: RN, PT, OT and HHA  If patient discharges after hours, please call [phone].   Kizzie Furnish 07/21/2018, 10:19 AM

## 2018-07-21 NOTE — Progress Notes (Signed)
Subjective: No overnight events. Rachael Robertson is seen suctioning her trach this morning. She reports that she cannot avoid suctioning because she can't breath if she doesn't do it regularly. She has continued to notice some hemoptysis, but the amount has not changed. She states that she could feel her knee hematoma expanding and getting tighter during the day yesterday, but she now feels like it has stabilized. She would like more data before making a decision as to whether or not she should continue anticoagulation.   Objective:  Vital signs in last 24 hours: Vitals:   07/21/18 0315 07/21/18 0355 07/21/18 0400 07/21/18 0600  BP: (!) 93/46 (!) 117/44 (!) 105/39 (!) 106/33  Pulse: 72 60 (!) 58 66  Resp: (!) 22 (!) 21 20 20   Temp:   99.5 F (37.5 C)   TempSrc:   Oral   SpO2: 96% 98% 93% 98%  Weight:    (!) 191.2 kg  Height:       Gen: obese woman, no distress CV: RRR, no murmurs Ext: Right knee hematoma appears stable in size compared to yesterday with deeper purple color. The right lower leg is less erythematous than yesterday without increased warmth. The dressing has scant serosanguinous drainage. The lesion is not actively weeping.   Assessment/Plan:  Active Problems:   Chronic respiratory failure with hypoxia (HCC)   Hemoptysis   Tracheostomy complication (HCC)  Ms. Halleckis a 68 year old morbidly obese woman with chronic hypercapnia secondary toOHS, OSAon chronic trach (usually on 2 L supplemental O2 via trilogy ventilator), tracheal spasm,HFrEF EF 45-50%, CKDIII,history of PE on Xarelto who presented with hemoptysis and a right-sided knee hematoma after resuming Xarelto on 2/3. Her Xarelto had been held for about two months after she had hemoptysis in 05/2018.  Hemoptysis Right knee hematoma  Acute blood loss anemia Chronic anticoagulation due to PE  - Patient is on anticoagulation because of an unprovoked PE in 2009. She also reports a history of a blood clot in her  knee. She was initially on coumadin, but her INRs were labile. She was later switched to Xarelto. She believes that the plan is for lifelong anticoagulation. She has never been diagnosed with afib or another arrhythmia.  - Continues to have mild hemoptysis. She is unable to stop suctioning her trach because otherwise she gets short of breath.  - The hematoma on her right knee is very large and likely represents the majority of her blood loss compared to her hemoptysis. This appears stable in size today.  - Hb increased from 7.1 to 7.4 after 1u RBCs. Hb this am is still 7.4.  Plan -Hold Xarelto. Will have a risks/benefits with the patient about anticoagulation to determine whether to discontinue or restart. - If hematuria does not resolve on outpatient follow-up, she may benefit from a CT abdomen pelvis - Continue SCDs for VTE ppx  Nonpurulent cellulitis - She is afebrile without leukocytosis - The right knee hematoma is stable today. The right leg appears less erythematous without increased warmth today.  Plan - Continue Keflex 500 mg BID (day 2)  UTI Hematuria - Urine culture grew >100,000 colonies of gram negative rods - Patient denies any urinary symptoms  Plan - Continue Keflex - F/u sensitivities   AKI on CKDIII - Likely prerenal in the setting of acute blood loss - Cr improved from 1.9 on admission to 1.24 today (below her baseline) Plan - F/u urine urea to calculate FeUrea - Resume home spironolactone 100mg  daily - Continue to  hold home Bumex 4 mg daily  Chronic HFrEF - Difficult to assess volume status due to body habitus, but patient denies dyspnea. No concern for acute exacerbation at this time. Plan - Continue cardiac monitoring -Strict intake and output - Resume home spironolactone 100mg  daily - Continue to hold home Bumex 4 mg daily - Hold Coreg in setting of soft BP  History of chronic hypercapnic respiratory failure Obesity hypoventilation  syndrome Obstructive sleep apnea Chronic trach - No dyspnea presently. Adherent with ventilator at home.  Plan - PCCM consulted, appreciate recs - RT consulted, appreciate trach care - Supplemental oxygen by trach collar during the day - Continue home ventilator settings at night  - O2 saturation goal: 88-92% - Continue albuterol, Dulera  Candidal Intertrigo  - Has failed topical therapy - Wound care has evaluated the patient and made recommendations for antimicrobial dressings Plan - Continue topical nystatin  - Fluconazole 150mg  weekly for 4 weeks (received first dose 2/11) - Continue wound care with InterDry Ag dressings   Hypothyroidism: Continue Synthroid 50 mcg daily GERD: Continue home PPI Gout: Continue allopurinol RLS: Continue home pramipexole  Dispo: Anticipated discharge in approximately 1 day.   , [name], MD 07/21/2018, 7:08 AM Pager: [phone]

## 2018-07-21 NOTE — Progress Notes (Signed)
  Date: 07/21/2018  Patient name: Rachael Robertson  Medical record number: [id number]  Date of birth: 1951-01-11   I have seen and evaluated this patient and I have discussed the plan of care with the house staff. Please see Dr. Letitia Neri note for complete details. I concur with her findings and plan.    UC grew proteus, Abx were changed to Cefdinir by PCCM.  Follow up sensitivities.  She is asymptomatic, but treatment was initiated for cellulitis and can cover both issues.    Inez Catalina, MD 07/21/2018, 4:41 PM

## 2018-07-22 DIAGNOSIS — B964 Proteus (mirabilis) (morganii) as the cause of diseases classified elsewhere: Secondary | ICD-10-CM

## 2018-07-22 DIAGNOSIS — Z1611 Resistance to penicillins: Secondary | ICD-10-CM

## 2018-07-22 DIAGNOSIS — B952 Enterococcus as the cause of diseases classified elsewhere: Secondary | ICD-10-CM

## 2018-07-22 DIAGNOSIS — Z1629 Resistance to other single specified antibiotic: Secondary | ICD-10-CM

## 2018-07-22 DIAGNOSIS — Z9889 Other specified postprocedural states: Secondary | ICD-10-CM

## 2018-07-22 LAB — URINE CULTURE

## 2018-07-22 LAB — CBC
HCT: 26.1 % — ABNORMAL LOW (ref 36.0–46.0)
Hemoglobin: 7.6 g/dL — ABNORMAL LOW (ref 12.0–15.0)
MCH: 27.1 pg (ref 26.0–34.0)
MCHC: 29.1 g/dL — AB (ref 30.0–36.0)
MCV: 93.2 fL (ref 80.0–100.0)
Platelets: 177 10*3/uL (ref 150–400)
RBC: 2.8 MIL/uL — ABNORMAL LOW (ref 3.87–5.11)
RDW: 15.7 % — ABNORMAL HIGH (ref 11.5–15.5)
WBC: 5.3 10*3/uL (ref 4.0–10.5)
nRBC: 0 % (ref 0.0–0.2)

## 2018-07-22 MED ORDER — RIVAROXABAN 10 MG PO TABS: 10.0000 mg | ORAL_TABLET | Freq: Every day | ORAL | 0 refills | Status: DC

## 2018-07-22 MED ORDER — CEFDINIR 300 MG PO CAPS: 300.0000 mg | ORAL_CAPSULE | Freq: Two times a day (BID) | ORAL | 0 refills | Status: DC

## 2018-07-22 MED ORDER — CEPHALEXIN 500 MG PO CAPS: 500.0000 mg | ORAL_CAPSULE | Freq: Two times a day (BID) | ORAL | 0 refills | Status: DC

## 2018-07-22 MED ORDER — RIVAROXABAN 10 MG PO TABS
10.0000 mg | ORAL_TABLET | Freq: Every day | ORAL | Status: DC
  Administered 2018-07-22: 10 mg via ORAL
  Filled 2018-07-22: qty 1

## 2018-07-22 MED ORDER — FLUCONAZOLE 150 MG PO TABS: 150.0000 mg | ORAL_TABLET | ORAL | 0 refills | Status: AC

## 2018-07-22 MED ORDER — FERROUS SULFATE 325 (65 FE) MG PO TABS: 325.0000 mg | ORAL_TABLET | Freq: Every day | ORAL | 3 refills | Status: AC

## 2018-07-22 MED ORDER — CEPHALEXIN 500 MG PO CAPS
500.0000 mg | ORAL_CAPSULE | Freq: Two times a day (BID) | ORAL | Status: DC
  Administered 2018-07-22: 500 mg via ORAL

## 2018-07-22 MED ORDER — GERHARDT'S BUTT CREAM: 1.0000 "application " | TOPICAL_CREAM | Freq: Two times a day (BID) | CUTANEOUS | 0 refills | Status: AC

## 2018-07-22 NOTE — Progress Notes (Addendum)
Subjective: No overnight events. Ms. Starman reports that she feels well today except for some wrist pain that she believes is from her blood pressure cuff. Her right knee feels the same as yesterday and not any bigger. She denies any hemoptysis. All of her questions were answered.  Objective:  Vital signs in last 24 hours: Vitals:   07/22/18 0302 07/22/18 0400 07/22/18 0418 07/22/18 0500  BP:  118/69  (!) 129/42  Pulse:  (!) 55 (!) 58 (!) 54  Resp:  19 17 15   Temp: 98.5 F (36.9 C)     TempSrc: Oral     SpO2:  99% 100% 98%  Weight:      Height:       Gen: laying comfortably in bed, no distress Ext: Right knee hematoma is a deeper purple today. The size appears reduced compared to yesterday. She has a dressing in place over the area that was previously weeping a serosanguinous fluid. The right lower leg has no erythema or increased warmth compared to the left.   Assessment/Plan:  Active Problems:   Chronic respiratory failure with hypoxia (HCC)   Hemoptysis   Tracheostomy complication (HCC)  Ms. Halleckis a 68 year old morbidly obese woman with chronic hypercapnia secondary toOHS, OSAon chronic trach (usually on 2 L supplemental O2 via trilogy ventilator), tracheal spasm,HFrEF EF 45-50%, CKDIII,history of PE on Xareltowho presentedwith hemoptysis and a right-sided knee hematoma after resuming Xarelto on 2/3.Her Xarelto had been held for about two months after she had hemoptysis in 05/2018.  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 She is unable to stop suctioning her trach because otherwise she gets short of breath.  - The hematoma on her right knee is very large and likely represents the majority of her blood loss compared to her hemoptysis. This appears stable in size today. - Hb stable today at 7.6. - After an in-depth risks/benefits conversation with the patient about anticoagulation,  she has decided to continue with anticoagulation. She would like to try a lower dose of Xarelto and follow-up as an outpatient for factor X studies to see if the low dose gives her any benefit. Plan -Resume Xarelto at a reduced dose of 10mg  daily - Start iron supplement - Discharge home today  Nonpurulent cellulitis - She is afebrile without leukocytosis - The right knee hematoma is stable. The right leg appears less erythematous without increased warmth. Plan - Keflex (day 3/7)  UTI Hematuria - Urine culture grew >100,000 colonies proteus mirabilis and <100,000 enterococcus. The proteus is sensitive to all except r-amp/macrobid.  - Patient denies any urinary symptoms  - After speaking with pharmacy, will transition back to Keflex Plan - Keflex (day 3/7)   AKI on CKDIII - Likely prerenal in the setting of acute blood loss -Cr improved from 1.9 on admission to 1.24 (below her baseline) Plan - Continue homespironolactone 100mg  daily - Resume home Bumex 4 mg daily on discharge  ChronicHFrEF - Difficult to assess volume status due to body habitus, but patient denies dyspnea. No concern for acute exacerbation at this time. Plan -Continue cardiac monitoring -Strict intake and output - Continue homespironolactone 100mg  daily - Resume home bumex and coreg on discharge  History of chronic hypercapnic respiratory failure Obesity hypoventilation syndrome Obstructive sleep apnea Chronic trach - No dyspnea presently. Adherent with ventilator at home.  Plan - PCCM consulted, appreciate recs - RT consulted, appreciate trach care - Supplemental oxygen by  trach collar during the day - Continue home ventilator settings at night  - O2 saturation goal: 88-92% -Continue albuterol, Dulera  CandidalIntertrigo - Has failed topical therapy - Wound care has evaluated the patient and made recommendations for antimicrobial dressings Plan - Continue topical nystatin  -  Fluconazole 150mg  weekly for 4 weeks (received first dose 2/11) - Continue wound care with InterDry Ag dressings   Hypothyroidism:Continue Synthroid 50 mcg daily GERD: Continue home PPI Gout:Continue allopurinol RLS: Continue home pramipexole  Dispo: Anticipated discharge today.  Kavari Parrillo, Cathleen Cortieborah N, MD 07/22/2018, 6:23 AM Pager: [phone]

## 2018-07-22 NOTE — Discharge Summary (Signed)
Name: Rachael KluverKristy Robertson MRN: [id number] DOB: 02/25/1951 68 y.o. PCP: Rachael Courierhundi, Vahini, MD  Date of Admission: 07/19/2018  9:11 PM Date of Discharge: 07/22/2018 Attending Physician: Dr. Criselda Robertson  Discharge Diagnosis: 1. Hemoptysis 2. Right knee hematoma 3. Acute blood loss anemia 4. Chronic anticoagulation on Xarelto due to PE 5. Possible RLE cellulitis 6. Proteus mirabilis UTI 7. AKI on CKDIII 8. Candidal intertrigo   Discharge Medications: Allergies as of 07/22/2018      Reactions   Other Anaphylaxis   Melons   Fruit & Vegetable Daily [nutritional Supplements] Diarrhea   Have to be cooked or vine-ripened   Lactose Intolerance (gi) Diarrhea   Naproxen Other (See Comments)   Makes her "congested"   Soy Allergy Diarrhea   Sulfa Antibiotics Hives   Erythromycin Hives, Rash   Latex Rash   Mupirocin Rash   Tape Rash, Other (See Comments)   Plastic tape (alo) BURNS, but Tegaderm is tolerated      Medication List    STOP taking these medications   levofloxacin 500 MG tablet Commonly known as:  LEVAQUIN     TAKE these medications   acetaminophen 650 MG CR tablet Commonly known as:  TYLENOL Take 1,300 mg by mouth 2 (two) times daily.   albuterol 108 (90 Base) MCG/ACT inhaler Commonly known as:  PROVENTIL HFA;VENTOLIN HFA Inhale 1-2 puffs into the lungs every 6 (six) hours as needed for wheezing or shortness of breath.   allopurinol 100 MG tablet Commonly known as:  ZYLOPRIM Take 100 mg by mouth 2 (two) times daily.   budesonide-formoterol 160-4.5 MCG/ACT inhaler Commonly known as:  SYMBICORT Inhale 2 puffs into the lungs 2 (two) times daily.   bumetanide 2 MG tablet Commonly known as:  BUMEX Take 4 mg by mouth daily.   carvedilol 3.125 MG tablet Commonly known as:  COREG Take 3.125 mg by mouth See admin instructions. Take 3.125 mg by mouth two times a day with meals ONLY IF SYSTOLIC NUMBER IS 100 OR GREATER   cephALEXin 500 MG capsule Commonly known as:   KEFLEX Take 1 capsule (500 mg total) by mouth 2 (two) times daily for 10 days.   diclofenac sodium 1 % Gel Commonly known as:  VOLTAREN Apply 2 g topically 4 (four) times daily. What changed:    how much to take  when to take this  additional instructions   ferrous sulfate 325 (65 FE) MG tablet Take 1 tablet (325 mg total) by mouth daily with breakfast.   fluconazole 150 MG tablet Commonly known as:  DIFLUCAN Take 1 tablet (150 mg total) by mouth once a week. Start taking on:  July 27, 2018   Gerhardt's butt cream Crea Apply 1 application topically 2 (two) times daily.   levothyroxine 50 MCG tablet Commonly known as:  SYNTHROID, LEVOTHROID Take 50 mcg by mouth daily before breakfast.   omeprazole 20 MG capsule Commonly known as:  PRILOSEC Take 20 mg by mouth daily.   potassium chloride SA 20 MEQ tablet Commonly known as:  K-DUR,KLOR-CON Take 40 mEq by mouth daily.   pramipexole 1 MG tablet Commonly known as:  MIRAPEX Take 1 mg by mouth 2 (two) times daily.   rivaroxaban 10 MG Tabs tablet Commonly known as:  XARELTO Take 1 tablet (10 mg total) by mouth daily. What changed:    medication strength  how much to take  when to take this  Another medication with the same name was removed. Continue taking this medication, and follow  the directions you see here.   silver sulfADIAZINE 1 % cream Commonly known as:  SILVADENE Apply topically daily.   spironolactone 100 MG tablet Commonly known as:  ALDACTONE Take 100 mg by mouth daily.   traMADol 50 MG tablet Commonly known as:  ULTRAM Take 1 tablet (50 mg total) by mouth every 6 (six) hours as needed for moderate pain.       Disposition and follow-up:   [facility]achael Robertson [facility].  At the hospital follow up visit please address:  1.  Acute blood loss anemia - 2/2 hemoptysis and R knee hematoma - Received 1u RBCs. Started on iron supplement -  Please get CBC on f/u   Chronic anticoagulation 2/2 PE  - Isolated PE event in 2007. Patient has been on anticoagulation since this time. - She decided to continue with anticoagulation during this hospitalization, but at a decreased dose. - Resumed Xarelto at 10mg  daily instead of 20mg . - Please consider getting anti-factor Xa assay to screen for Xarelto efficacy. She would be amenable to increasing back to the therapeutic dose if necessary.   Proteus mirabilis UTI and hematuria - Discharged with keflex for 7 day course - Please obtain repeat UA to ensure hematuria cleared after treatment  2.  Labs / imaging needed at time of follow-up: CBC, anti-factor Xa assay, UA  3.  Pending labs/ test needing follow-up: none  Follow-up Appointments: Follow-up Information    [facility].   Specialty:  Home Health Services Why:  Resume HHRN, HHPT, HHOT, HHAIDE Contact information: [address] [place] [address] [phone]          [facility] week: she would prefer to call and schedule this herself, number provided   Hospital Course by problem list: 1. Acute blood loss anemia 2/2 to right knee hematoma and hemoptysis: [name] a 68 year old morbidly obese woman with chronic hypercapnia secondary toOHS, OSAon chronic trach (usually on 2 L supplemental O2 via trilogy ventilator), tracheal spasm,HFrEF EF 45-50%, CKDIII,history of PE on Xareltowho presentedwith hemoptysis and a right-sided knee hematoma (2/2 mechanical trauma) after resuming Xarelto on 2/3.Her Xarelto had been held for about two months after she had hemoptysis in 05/2018. She required 1u RBC transfusion for Hb 7.1. BL Hb 8-10. Hb at discharge was stable at 7.6. She was advised to suction her trach less frequently to avoid irritation and hemoptysis. She was unable to stop suctioning completely due to dyspnea. After extensive conversations about the risks/benefits of  anticoagulation, she opted to continue Xarelto, but at a lower dose. She was resumed on 10mg  Xarelto. She will f/u with her PCP for Factor X testing to see if the lower dose has any effect. If not, she will consider increasing back to the therapeutic dose of 20mg . She was also discharged on an iron supplement. She was discharged home with home RN for wound care of her right knee.   2. Possible RLE cellulitis: On presentation, the leg beneath the right knee hematoma was erythematous with increased warmth. She was started on Keflex. Her leg clinically improved during hospital stay.   3. Proteus mirabilis UTI: Microscopic hematuria on UA. Urine culture grew >100,000 proteus mirabilis, pan sensitive except to r-amp/macrobid. Asymptomatic. Patient on Keflex for a total of 7 day course.    4. Candidal intertrigo: Failed topical therapy with nystatin. Fluconazole weekly x4 weeks.   5. AKI on CKDIII: Cr improved from 1.9  on admission to 1.24 (below her baseline).   Discharge Vitals:   BP (!) 117/50 (BP Location: Right Wrist)   Pulse (!) 56   Temp 98.3 F (36.8 C) (Oral)   Resp 19   Ht 5' (1.524 m)   Wt (!) 191.2 kg   SpO2 98%   BMI 82.32 kg/m   Pertinent Labs, Studies, and Procedures:  CBC Latest Ref Rng & Units 07/22/2018 07/21/2018 07/20/2018  WBC 4.0 - 10.5 K/uL 5.3 5.4 -  Hemoglobin 12.0 - 15.0 g/dL 7.6(L) 7.4(L) 7.4(L)  Hematocrit 36.0 - 46.0 % 26.1(L) 25.5(L) 25.3(L)  Platelets 150 - 400 K/uL 177 175 -   CMP Latest Ref Rng & Units 07/21/2018 07/20/2018 07/19/2018  Glucose 70 - 99 mg/dL 99 86 300(F)  BUN 8 - 23 mg/dL 11(M) 21(R) 17(B)  Creatinine 0.44 - 1.00 mg/dL 5.67(O) 1.41(C) 3.01(T)  Sodium 135 - 145 mmol/L 139 138 139  Potassium 3.5 - 5.1 mmol/L 3.7 3.9 4.1  Chloride 98 - 111 mmol/L 91(L) 93(L) 92(L)  CO2 22 - 32 mmol/L 40(H) 34(H) 34(H)  Calcium 8.9 - 10.3 mg/dL 8.2(L) 8.3(L) 8.4(L)  Total Protein 6.5 - 8.1 g/dL - - 7.5  Total Bilirubin 0.3 - 1.2 mg/dL - - 0.6  Alkaline Phos 38  - 126 U/L - - 82  AST 15 - 41 U/L - - 20  ALT 0 - 44 U/L - - 20     Discharge Instructions: Discharge Instructions    Diet - low sodium heart healthy   Complete by:  As directed    Discharge instructions   Complete by:  As directed    It was a pleasure taking care of you during your hospital stay, Rachael Robertson.  1. After getting one blood transfusion, your hemoglobin level has been stable.  2. We have restarted your xarelto at a lower dose of 10mg  daily. Please follow-up in our clinic (located on the ground floor of the hospital under the emergency department) to consider Factor X testing. Our clinic will call you to schedule an appointment.   3. You should continue taking the Omnicef antibiotic for your right leg infection and urinary tract infection. Take this medication twice a day through the morning of 2/19  4. I have placed an order for a home health nurse to come by to help with your right knee dressing changes  5. Please see a medical provider if you have any bleeding or if your current bloody sputum worsens.   Feel free to call our clinic at [phone] if you have any questions.  Thanks, Dr. Avie Arenas   Increase activity slowly   Complete by:  As directed       Signed: Dorrell, [name], MD 07/22/2018, 3:27 PM   Pager: [phone]

## 2018-07-22 NOTE — Discharge Instructions (Signed)
Information on my medicine - XARELTO (rivaroxaban)  WHY WAS XARELTO PRESCRIBED FOR YOU? Xarelto was prescribed for history of blood clots in your lungs (pulmonary embolism) and to reduce the risk of them occurring again.  What do you need to know about Xarelto?  Your dose was reduced from 20 mg to 10 mg tablet taken ONCE A DAY with your evening meal.  DO NOT stop taking Xarelto without talking to the health care provider who prescribed the medication.  Refill your prescription for 10 mg tablets before you run out.  After discharge, you should have regular check-up appointments with your healthcare provider that is prescribing your Xarelto.  In the future your dose may need to be changed if your kidney function changes by a significant amount.  What do you do if you miss a dose? If you are taking Xarelto ONCE DAILY and you miss a dose, take it as soon as you remember on the same day then continue your regularly scheduled once daily regimen the next day. Do not take two doses of Xarelto at the same time.   Important Safety Information Xarelto is a blood thinner medicine that can cause bleeding. You should call your healthcare provider right away if you experience any of the following: ? Bleeding from an injury or your nose that does not stop. ? Unusual colored urine (red or dark brown) or unusual colored stools (red or black). ? Unusual bruising for unknown reasons. ? A serious fall or if you hit your head (even if there is no bleeding).  Some medicines may interact with Xarelto and might increase your risk of bleeding while on Xarelto. To help avoid this, consult your healthcare provider or pharmacist prior to using any new prescription or non-prescription medications, including herbals, vitamins, non-steroidal anti-inflammatory drugs (NSAIDs) and supplements.  This website has more information on Xarelto: [email or web address].xarelto.[email or web address].

## 2018-07-22 NOTE — Care Management Note (Addendum)
Case Management Note  Patient Details  Name: Rachael Robertson MRN: [id number] Date of Birth: [birth year]  Subjective/Objective:    From home, active with [facility] for HHRN, HHPT, HHOT, HHAIDE, will resume at dc. [facility] notifired of dc  Today, patient will need ptar transport and address confirmed by [facility].  NCM will set transport for 2 pm tolay 2/13. Ambulance forms are on the chart.                Action/Plan: DC home when ready.   Expected Discharge Date:  07/22/18               Expected Discharge Plan:  Home w Home Health Services  In-House Referral:     Discharge planning Services  CM Consult  Post Acute Care Choice:    Choice offered to:  Patient  DME Arranged:    DME Agency:     HH Arranged:  RN, Disease Management, PT, OT HH Agency:  [facility]  Status of Service:  Completed, signed off  If discussed at Long Length of Stay Meetings, dates discussed:    Additional Comments:  Leone Haven, RN 07/22/2018, 12:28 PM

## 2018-07-22 NOTE — Progress Notes (Signed)
Pt requested a PRN mirapex:, states that "She sets her own dose at home." Dr. Luberta Robertson. Did not order PRN will address with team in the AM.

## 2018-07-22 NOTE — Progress Notes (Signed)
  Date: 07/22/2018  Patient name: Rachael Robertson  Medical record number: [id number]  Date of birth: 1950/06/24   I have seen and evaluated this patient and I have discussed the plan of care with the house staff. Please see Dr. Letitia Neri note for complete details. I concur with her findings and plan.     Hopefully the prophylactic dose of Xarelto will afford her some protection (though not tested in people > 120kg) and decrease the risk of further bleeding.  All questions were answered about this plan for Rachael Robertson.  She will return to our clinic for further follow up.   Inez Catalina, MD 07/22/2018, 4:03 PM

## 2018-07-23 ENCOUNTER — Telehealth: Payer: Self-pay | Admitting: *Deleted

## 2018-07-23 NOTE — Telephone Encounter (Signed)
HHN calls and states she recommended 2x week for drsg changes, will not wrap with gauze will use self sticking foam. VO given, do you agree?

## 2018-07-25 NOTE — Telephone Encounter (Signed)
agree

## 2018-07-26 ENCOUNTER — Encounter: Payer: Self-pay | Admitting: Internal Medicine

## 2018-07-26 ENCOUNTER — Ambulatory Visit (INDEPENDENT_AMBULATORY_CARE_PROVIDER_SITE_OTHER): Payer: Medicare Other | Admitting: Internal Medicine

## 2018-07-26 ENCOUNTER — Inpatient Hospital Stay (HOSPITAL_COMMUNITY)
Admission: AD | Admit: 2018-07-26 | Discharge: 2018-08-04 | DRG: 577 | Disposition: A | Discharge status: Skilled Nursing Facility | Payer: Medicare Other | Source: Ambulatory Visit | Attending: Internal Medicine | Admitting: Internal Medicine

## 2018-07-26 ENCOUNTER — Other Ambulatory Visit: Payer: Self-pay

## 2018-07-26 ENCOUNTER — Telehealth: Payer: Self-pay | Admitting: *Deleted

## 2018-07-26 VITALS — BP 122/70 | HR 79 | Temp 97.6°F | Ht 60.0 in | Wt >= 6400 oz

## 2018-07-26 DIAGNOSIS — B372 Candidiasis of skin and nail: Secondary | ICD-10-CM | POA: Diagnosis not present

## 2018-07-26 DIAGNOSIS — Z7901 Long term (current) use of anticoagulants: Secondary | ICD-10-CM | POA: Diagnosis not present

## 2018-07-26 DIAGNOSIS — Z9981 Dependence on supplemental oxygen: Secondary | ICD-10-CM | POA: Diagnosis not present

## 2018-07-26 DIAGNOSIS — I5022 Chronic systolic (congestive) heart failure: Secondary | ICD-10-CM | POA: Diagnosis present

## 2018-07-26 DIAGNOSIS — Z791 Long term (current) use of non-steroidal anti-inflammatories (NSAID): Secondary | ICD-10-CM

## 2018-07-26 DIAGNOSIS — W2203XA Walked into furniture, initial encounter: Secondary | ICD-10-CM | POA: Diagnosis present

## 2018-07-26 DIAGNOSIS — Z930 Tracheostomy status: Secondary | ICD-10-CM

## 2018-07-26 DIAGNOSIS — Z87440 Personal history of urinary (tract) infections: Secondary | ICD-10-CM | POA: Diagnosis not present

## 2018-07-26 DIAGNOSIS — E662 Morbid (severe) obesity with alveolar hypoventilation: Secondary | ICD-10-CM | POA: Diagnosis present

## 2018-07-26 DIAGNOSIS — I5020 Unspecified systolic (congestive) heart failure: Secondary | ICD-10-CM

## 2018-07-26 DIAGNOSIS — N183 Chronic kidney disease, stage 3 (moderate): Secondary | ICD-10-CM

## 2018-07-26 DIAGNOSIS — R1903 Right lower quadrant abdominal swelling, mass and lump: Secondary | ICD-10-CM | POA: Diagnosis present

## 2018-07-26 DIAGNOSIS — G2581 Restless legs syndrome: Secondary | ICD-10-CM | POA: Diagnosis present

## 2018-07-26 DIAGNOSIS — S8011XA Contusion of right lower leg, initial encounter: Secondary | ICD-10-CM | POA: Diagnosis not present

## 2018-07-26 DIAGNOSIS — D62 Acute posthemorrhagic anemia: Secondary | ICD-10-CM | POA: Diagnosis present

## 2018-07-26 DIAGNOSIS — I96 Gangrene, not elsewhere classified: Secondary | ICD-10-CM | POA: Diagnosis present

## 2018-07-26 DIAGNOSIS — S8011XD Contusion of right lower leg, subsequent encounter: Secondary | ICD-10-CM | POA: Diagnosis present

## 2018-07-26 DIAGNOSIS — Z6841 Body Mass Index (BMI) 40.0 and over, adult: Secondary | ICD-10-CM

## 2018-07-26 DIAGNOSIS — Z7722 Contact with and (suspected) exposure to environmental tobacco smoke (acute) (chronic): Secondary | ICD-10-CM | POA: Diagnosis not present

## 2018-07-26 DIAGNOSIS — Z79890 Hormone replacement therapy (postmenopausal): Secondary | ICD-10-CM | POA: Diagnosis not present

## 2018-07-26 DIAGNOSIS — X58XXXD Exposure to other specified factors, subsequent encounter: Secondary | ICD-10-CM

## 2018-07-26 DIAGNOSIS — Z86711 Personal history of pulmonary embolism: Secondary | ICD-10-CM

## 2018-07-26 DIAGNOSIS — L97811 Non-pressure chronic ulcer of other part of right lower leg limited to breakdown of skin: Secondary | ICD-10-CM | POA: Diagnosis not present

## 2018-07-26 DIAGNOSIS — Z7951 Long term (current) use of inhaled steroids: Secondary | ICD-10-CM

## 2018-07-26 DIAGNOSIS — J9612 Chronic respiratory failure with hypercapnia: Secondary | ICD-10-CM | POA: Diagnosis present

## 2018-07-26 DIAGNOSIS — I493 Ventricular premature depolarization: Secondary | ICD-10-CM | POA: Diagnosis not present

## 2018-07-26 DIAGNOSIS — K219 Gastro-esophageal reflux disease without esophagitis: Secondary | ICD-10-CM | POA: Diagnosis present

## 2018-07-26 DIAGNOSIS — R319 Hematuria, unspecified: Secondary | ICD-10-CM | POA: Diagnosis present

## 2018-07-26 DIAGNOSIS — M109 Gout, unspecified: Secondary | ICD-10-CM | POA: Diagnosis present

## 2018-07-26 DIAGNOSIS — J9611 Chronic respiratory failure with hypoxia: Secondary | ICD-10-CM | POA: Diagnosis present

## 2018-07-26 DIAGNOSIS — W050XXA Fall from non-moving wheelchair, initial encounter: Secondary | ICD-10-CM | POA: Diagnosis present

## 2018-07-26 DIAGNOSIS — Z9889 Other specified postprocedural states: Secondary | ICD-10-CM | POA: Diagnosis not present

## 2018-07-26 DIAGNOSIS — E039 Hypothyroidism, unspecified: Secondary | ICD-10-CM | POA: Diagnosis present

## 2018-07-26 DIAGNOSIS — T148XXA Other injury of unspecified body region, initial encounter: Secondary | ICD-10-CM

## 2018-07-26 DIAGNOSIS — D649 Anemia, unspecified: Secondary | ICD-10-CM | POA: Diagnosis not present

## 2018-07-26 DIAGNOSIS — X58XXXA Exposure to other specified factors, initial encounter: Secondary | ICD-10-CM | POA: Diagnosis not present

## 2018-07-26 DIAGNOSIS — L304 Erythema intertrigo: Secondary | ICD-10-CM | POA: Diagnosis present

## 2018-07-26 DIAGNOSIS — Z79899 Other long term (current) drug therapy: Secondary | ICD-10-CM

## 2018-07-26 LAB — CBC WITH DIFFERENTIAL/PLATELET
Abs Immature Granulocytes: 0.09 10*3/uL — ABNORMAL HIGH (ref 0.00–0.07)
BASOS ABS: 0 10*3/uL (ref 0.0–0.1)
Basophils Relative: 1 %
Eosinophils Absolute: 0.2 10*3/uL (ref 0.0–0.5)
Eosinophils Relative: 3 %
HCT: 31.3 % — ABNORMAL LOW (ref 36.0–46.0)
Hemoglobin: 8.9 g/dL — ABNORMAL LOW (ref 12.0–15.0)
Immature Granulocytes: 1 %
Lymphocytes Relative: 12 %
Lymphs Abs: 0.8 10*3/uL (ref 0.7–4.0)
MCH: 26.5 pg (ref 26.0–34.0)
MCHC: 28.4 g/dL — ABNORMAL LOW (ref 30.0–36.0)
MCV: 93.2 fL (ref 80.0–100.0)
Monocytes Absolute: 0.3 10*3/uL (ref 0.1–1.0)
Monocytes Relative: 5 %
Neutro Abs: 5.5 10*3/uL (ref 1.7–7.7)
Neutrophils Relative %: 78 %
PLATELETS: 235 10*3/uL (ref 150–400)
RBC: 3.36 MIL/uL — ABNORMAL LOW (ref 3.87–5.11)
RDW: 15.4 % (ref 11.5–15.5)
WBC: 6.9 10*3/uL (ref 4.0–10.5)
nRBC: 0 % (ref 0.0–0.2)

## 2018-07-26 LAB — BASIC METABOLIC PANEL
Anion gap: 10 (ref 5–15)
BUN: 45 mg/dL — AB (ref 8–23)
CO2: 31 mmol/L (ref 22–32)
Calcium: 8.9 mg/dL (ref 8.9–10.3)
Chloride: 95 mmol/L — ABNORMAL LOW (ref 98–111)
Creatinine, Ser: 1.38 mg/dL — ABNORMAL HIGH (ref 0.44–1.00)
GFR calc Af Amer: 46 mL/min — ABNORMAL LOW (ref >60)
GFR calc non Af Amer: 40 mL/min — ABNORMAL LOW (ref >60)
GLUCOSE: 130 mg/dL — AB (ref 70–99)
Potassium: 4.8 mmol/L (ref 3.5–5.1)
Sodium: 136 mmol/L (ref 135–145)

## 2018-07-26 LAB — GLUCOSE, CAPILLARY: Glucose-Capillary: 111 mg/dL — ABNORMAL HIGH (ref 70–99)

## 2018-07-26 MED ORDER — FERROUS SULFATE 325 (65 FE) MG PO TABS
325.0000 mg | ORAL_TABLET | Freq: Every day | ORAL | Status: DC
  Administered 2018-07-28 – 2018-08-04 (×7): 325 mg via ORAL
  Filled 2018-07-26 (×9): qty 1

## 2018-07-26 MED ORDER — PANTOPRAZOLE SODIUM 40 MG PO TBEC
40.0000 mg | DELAYED_RELEASE_TABLET | Freq: Every day | ORAL | Status: DC
  Administered 2018-07-26 – 2018-08-04 (×10): 40 mg via ORAL
  Filled 2018-07-26 (×10): qty 1

## 2018-07-26 MED ORDER — LEVOTHYROXINE SODIUM 50 MCG PO TABS
50.0000 ug | ORAL_TABLET | Freq: Every day | ORAL | Status: DC
  Administered 2018-07-27 – 2018-08-04 (×8): 50 ug via ORAL
  Filled 2018-07-26 (×9): qty 1

## 2018-07-26 MED ORDER — COLCHICINE 0.6 MG PO TABS
0.6000 mg | ORAL_TABLET | Freq: Every day | ORAL | Status: DC
  Administered 2018-07-26 – 2018-08-04 (×10): 0.6 mg via ORAL
  Filled 2018-07-26 (×10): qty 1

## 2018-07-26 MED ORDER — FUROSEMIDE 10 MG/ML IJ SOLN
40.0000 mg | Freq: Two times a day (BID) | INTRAMUSCULAR | Status: DC
  Filled 2018-07-26: qty 4

## 2018-07-26 MED ORDER — TRAMADOL HCL 50 MG PO TABS
50.0000 mg | ORAL_TABLET | Freq: Four times a day (QID) | ORAL | Status: DC | PRN
  Administered 2018-07-27 – 2018-08-04 (×8): 50 mg via ORAL
  Filled 2018-07-26 (×8): qty 1

## 2018-07-26 MED ORDER — MOMETASONE FURO-FORMOTEROL FUM 200-5 MCG/ACT IN AERO
2.0000 | INHALATION_SPRAY | Freq: Two times a day (BID) | RESPIRATORY_TRACT | Status: DC
  Administered 2018-07-26 – 2018-08-04 (×18): 2 via RESPIRATORY_TRACT
  Filled 2018-07-26: qty 8.8

## 2018-07-26 NOTE — H&P (Signed)
Date: 07/26/2018               Patient Name:  Rachael KluverKristy Gallen MRN: [id number]  DOB: 09-03-1950 Age / Sex: 68 y.o., female   PCP: Lorenso Courierhundi, Vahini, MD         Medical Service: Internal Medicine Teaching Service         Attending Physician: Dr. Earl LagosNarendra, Nischal, MD    First Contact: Dr. Gwyneth RevelsKrienke Pager: [phone]  Second Contact: Dr. 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 She was discharged on 07/22/18 after being admitted for hemoptysis and acute blood loss anemia. She was noted to have the hematoma at that time, she denied any knee pain but did have some numbness. The hematoma started when she was trying to sit down on her wheel chair and she fell down on her right knee, she also ran into the sofa a few days later. Today she reports that her shin is causing her a lot of pain and that she feels like it is getting bigger, she states that it actually feels a little better then when she was discharged. However her main concern was back pain due to sitting up in the wheel chair, she appeared very uncomfortable and reported that she normally lays flat in bed with her legs up. She also reported that she was having left hand pain, she felt like her gout was acting up. After discharge from the hospital she states that she was not able to pick up her medications. She had not been taking her allopurinol. She lives by herself and normally her family lives with her however they were out of town. She does have caregivers that come in twice a day to help however  reports that they moved her medications.  In the clinic she was found to have normal vital signs.  CBC showed no leukocytosis, hemoglobin 8.9, hemoglobin on discharge was 7.6.  BMP showed Cr 1.38, close to her baseline of around 1.3.    Meds:  No outpatient medications have been marked as taking for the 07/26/18 encounter [facility](Hospital Encounter).     Allergies: Allergies as of 07/26/2018 - Review Complete 07/26/2018  Allergen Reaction Noted  . Other Anaphylaxis 07/19/2018  . Fruit & vegetable daily [nutritional supplements] Diarrhea 07/19/2018  . Lactose intolerance (gi) Diarrhea 07/19/2018  . Naproxen Other (See Comments) 05/03/2018  . Soy allergy Diarrhea 07/19/2018  . Sulfa antibiotics Hives 05/03/2018  . Erythromycin Hives and Rash 05/03/2018  . Latex Rash 07/19/2018  . Mupirocin Rash 07/19/2018  . Tape Rash and Other (See Comments) 07/19/2018   Past Medical History:  Diagnosis Date  . Obesity   . Opiate use   . Tracheostomy in place [facility](HCC)     Family History: Father has heart disease  Social History: Denies smoking, EtOH use or other drug use. She lives at home by herself, has caregivers come in in the morning and the evening, family lives nearby however they are out of town right now.   Review of Systems: A  complete ROS was negative except as per HPI.   Physical Exam: Blood pressure (!) 86/57, pulse 67, temperature 98.2 F (36.8 C), temperature source Oral, resp. rate 18, SpO2 97 %. Physical Exam  Constitutional: She is oriented to person, place, and time.  Chronically ill appearing, intermittent distress, trach in place  HENT:  Head: Normocephalic and atraumatic.  Eyes: Pupils are equal, round, and reactive to light. Conjunctivae and EOM are normal.  Neck: Normal range of motion.  Tracheostomy in place, oxygen on  Cardiovascular: Normal rate, regular rhythm and normal heart sounds.  Pulmonary/Chest: Effort normal and breath sounds normal. No respiratory distress.    Abdominal: Bowel sounds are normal.  Obese abdomen, tender in RLQ, firm area  Musculoskeletal:     Comments: Right shin: large, 20x20cm hematoma over midshin with cental areas of ulceration, oozing out bright blood, with minimal areas of necrosis Left hand: 1st metatarsal warm, tender, with some edema  Neurological: She is alert and oriented to person, place, and time.  Skin: Skin is dry.  Psychiatric: Mood and affect normal.   Assessment & Plan by Problem: Active Problems:   Chronic respiratory failure with hypoxia (HCC)   Tracheostomy status (HCC)   Subcutaneous hematoma  This is a 68 year old morbidly obese female with chronic hypercapnia secondary to OHS, OSA on chronic trach, tracheal spasm, HFrEF, CKD stage III, history of PE on Xarelto who presented with worsening right shin hematoma.     Right shin hematoma:  Anemia:  -Right shin hematoma, with worsening enlargement, seen on prior admission. She reports that she had fallen down on her knee and then ran into the sofa a few days later. This was noted on her prior admission however on hospital follow up it appears that it has been getting larger and more painful. It appears that there may be bone exposure however it's difficult to appreciate due to drainage. She has remained afebrile and does not have any leukocytosis. Hgb today was 8.9, it was 7.6 on discharge. It appears that her hematoma is worsening and she will likely benefit from a washout.  -Consult orthopedics to evaluate for possible wash out -Hold Xeralto -CBC in AM  Chronic hypercapnic respiratory failure:  OSA, OHS: Chronic trach: -She has been compliant with ventilator at home, she is on 4L O2 at night and 2L during the day. She has caregivers come in twice a day to help with this. She denies any worsening shortness of breath, she states that her breathing has been good since she has been discharged.  -Pulmonology consult for assistance with trach  management -Continue albuterol PRN -Continue trach at night -Limit suctioning given history of hemoptysis  Chronic anticoagulation on Xarelto due to PE: -Has had multiple episodes of bleeding, including hematemesis, hematuria, and hemoptysis. On her last admission we had discussed that she may need to stop anticoagulation however she decided that her risk of clots were worse then her risk of bleeding. She had an isolated PE event in 2007, she has been on anticoagulation since this time. She had another episode of hemoptysis on her last admission and she decided to resume xeralto at a lower dose then normal.  -Hold Xeralto for now -Continue to monitor for clots and bleeding  History of UTI, proteus mirabilis: -Noted on prior admission, was supposed to complete a course of Keflex however patient was unable to pick this up. She denied any issues with urination. -Will repeat labs today. -Urinalysis today  Intertrigo: -Topical nystatin -Will likely need  fluconazole weekly x4 weeks, first dose was on 2/11, will give 1 dose fluconazole tomorrow 2/18  Acute gout: -She is on allopurinol at home however reports that she does not always take this. She states that her left hand has been acting up and causing a lot of pain.  -Start colchicine for now -Hold allopurinol  Hypothyroidism: Continue synthroid 50 mcg daily GERD: Conitnue home PPI RLS: Continue home pramipexole  Dispo: Admit patient to Inpatient with expected length of stay greater than 2 midnights.  Signed: [name], MD 07/26/2018, 7:11 PM  Pager: [phone]

## 2018-07-26 NOTE — Telephone Encounter (Signed)
Called 21M gave report, requested bariatric bed and bariatric walker. Staff nurse 21M will call triage back when available

## 2018-07-26 NOTE — Assessment & Plan Note (Signed)
Assessment/plan: [name] has a very large hematoma with overlying ulcerated lesion.  She is bleeding from the site and I am concerned that she will be unable to take care of the wound at home and will continue to bleed.  She will be admitted with potential surgery consultation.

## 2018-07-26 NOTE — Progress Notes (Signed)
   CC: Worsening right shin hematoma  HPI:  Rachael Robertson is a 68 y.o. female with chronic hypercapnia secondary to OHS, OSA on chronic trach, tracheal spasms, HEFrEF, CKD stage III, history of PE on Xarelto who presents for hospital follow-up.  She was admitted from 2/10 to 2/13 with hemoptysis and a right shin hematoma (2/2 mechanical trauma) after resuming Xarelto (chronic anticoagulation for hx of PE; previously held because of hemoptysis).  During her hospitalization she required 1 unit pRBC (Hb 7.1 on admission) and was advised to suction her trach less frequently to avoid irritation hemoptysis. At discharge she elected to resume her Xarelto at a lower dose--10 mg daily.  Additionally during her hospitalization she was found to have a: 1) possible right lower extremity cellulitis which was treated with Keflex 2) Proteus Mirabella's UTI with microscopic hematuria on urinalysis.  Treated with 7-day course of Keflex.  She was unable to pick up any of her medications after being discharged from the hospital including the Keflex and Xarelto.  She does believe the right shin hematoma has worsened over the last several days.  Over the last 24 hours the area has started to bleed.  She is having difficulty reaching the area to stop the bleeding.  She denies fevers and chills.  She has felt more fatigued over the last several days after.  She also reports a small amount of blood whenever she is coughing over the last several days.  She denies any hemoptysis today.    Past Medical History:  Diagnosis Date  . Obesity   . Opiate use   . Tracheostomy in place [facility])    Review of Systems:   Review of Systems  Constitutional: Positive for malaise/fatigue. Negative for chills and fever.  Respiratory: Positive for hemoptysis. Negative for cough and shortness of breath.   Cardiovascular: Positive for leg swelling. Negative for chest pain and palpitations.  Gastrointestinal: Negative for abdominal  pain, diarrhea, nausea and vomiting.  Genitourinary: Negative for dysuria and frequency.       Dark colored urine  All other systems reviewed and are negative.    Physical Exam:  Vitals:   07/26/18 1313  BP: 104/67  Pulse: 74  Temp: 97.6 F (36.4 C)  SpO2: 100%  Weight: (!) 435 lb (197.3 kg)  Height: 5' (1.524 m)   Physical Exam Vitals signs reviewed.  Constitutional:      Comments: Morbidly obese female sitting up in wheelchair in no acute distress.  HENT:     Head: Normocephalic and atraumatic.  Cardiovascular:     Rate and Rhythm: Normal rate and regular rhythm.     Pulses: Normal pulses.     Heart sounds: Normal heart sounds. No murmur.  Pulmonary:     Effort: Pulmonary effort is normal. No respiratory distress.     Breath sounds: Normal breath sounds.     Comments: Trach in place. Neurological:     Mental Status: She is oriented to person, place, and time. Mental status is at baseline.  Psychiatric:        Mood and Affect: Mood normal.        Behavior: Behavior normal.    Skin: Large 20 cm x 20 cm hematoma of the right shin.  Ulcerated lesion on the anterior shin of the right leg with large blood clot and mild active bleeding.    Assessment & Plan:   See Encounters Tab for problem based charting.  Patient seen with Dr. Oswaldo Done

## 2018-07-26 NOTE — Assessment & Plan Note (Addendum)
Assessment/plan: Hematuria noted on previous urinalysis while admitted.  Was thought to be due to an underlying urinary tract infection.  She was given Keflex while in the hospital but did not pick up the prescription.  She denies any dysuria and increased frequency.  She does report "dark-colored urine today".  We will get urinalysis with reflex microscopy to evaluate for ongoing hematuria.

## 2018-07-26 NOTE — Assessment & Plan Note (Signed)
Assessment/plan: She received a blood transfusion in the hospital several days ago.  Her hemoglobin has continued to improve to 8.9 today.  We will continue to monitor CBCs in the setting of active bleeding.

## 2018-07-27 DIAGNOSIS — X58XXXA Exposure to other specified factors, initial encounter: Secondary | ICD-10-CM

## 2018-07-27 DIAGNOSIS — J9612 Chronic respiratory failure with hypercapnia: Secondary | ICD-10-CM

## 2018-07-27 DIAGNOSIS — Z7901 Long term (current) use of anticoagulants: Secondary | ICD-10-CM

## 2018-07-27 DIAGNOSIS — J9611 Chronic respiratory failure with hypoxia: Secondary | ICD-10-CM

## 2018-07-27 DIAGNOSIS — Z930 Tracheostomy status: Secondary | ICD-10-CM

## 2018-07-27 DIAGNOSIS — Z79899 Other long term (current) drug therapy: Secondary | ICD-10-CM

## 2018-07-27 DIAGNOSIS — D62 Acute posthemorrhagic anemia: Secondary | ICD-10-CM

## 2018-07-27 DIAGNOSIS — E039 Hypothyroidism, unspecified: Secondary | ICD-10-CM

## 2018-07-27 DIAGNOSIS — B372 Candidiasis of skin and nail: Secondary | ICD-10-CM

## 2018-07-27 DIAGNOSIS — G2581 Restless legs syndrome: Secondary | ICD-10-CM

## 2018-07-27 DIAGNOSIS — M109 Gout, unspecified: Secondary | ICD-10-CM

## 2018-07-27 DIAGNOSIS — I5022 Chronic systolic (congestive) heart failure: Secondary | ICD-10-CM

## 2018-07-27 DIAGNOSIS — S8011XA Contusion of right lower leg, initial encounter: Principal | ICD-10-CM

## 2018-07-27 DIAGNOSIS — Z86711 Personal history of pulmonary embolism: Secondary | ICD-10-CM

## 2018-07-27 DIAGNOSIS — Z79890 Hormone replacement therapy (postmenopausal): Secondary | ICD-10-CM

## 2018-07-27 DIAGNOSIS — E662 Morbid (severe) obesity with alveolar hypoventilation: Secondary | ICD-10-CM

## 2018-07-27 DIAGNOSIS — Z87440 Personal history of urinary (tract) infections: Secondary | ICD-10-CM

## 2018-07-27 DIAGNOSIS — K219 Gastro-esophageal reflux disease without esophagitis: Secondary | ICD-10-CM

## 2018-07-27 DIAGNOSIS — Z9981 Dependence on supplemental oxygen: Secondary | ICD-10-CM

## 2018-07-27 DIAGNOSIS — Z9889 Other specified postprocedural states: Secondary | ICD-10-CM

## 2018-07-27 DIAGNOSIS — N183 Chronic kidney disease, stage 3 (moderate): Secondary | ICD-10-CM

## 2018-07-27 LAB — CBC
HCT: 24.4 % — ABNORMAL LOW (ref 36.0–46.0)
Hemoglobin: 6.9 g/dL — CL (ref 12.0–15.0)
MCH: 26.1 pg (ref 26.0–34.0)
MCHC: 28.3 g/dL — ABNORMAL LOW (ref 30.0–36.0)
MCV: 92.4 fL (ref 80.0–100.0)
Platelets: 170 10*3/uL (ref 150–400)
RBC: 2.64 MIL/uL — ABNORMAL LOW (ref 3.87–5.11)
RDW: 15.6 % — ABNORMAL HIGH (ref 11.5–15.5)
WBC: 5.6 10*3/uL (ref 4.0–10.5)
nRBC: 0 % (ref 0.0–0.2)

## 2018-07-27 LAB — HEMOGLOBIN AND HEMATOCRIT, BLOOD
HCT: 27.5 % — ABNORMAL LOW (ref 36.0–46.0)
Hemoglobin: 8 g/dL — ABNORMAL LOW (ref 12.0–15.0)

## 2018-07-27 LAB — BASIC METABOLIC PANEL
Anion gap: 8 (ref 5–15)
BUN: 50 mg/dL — ABNORMAL HIGH (ref 8–23)
CHLORIDE: 95 mmol/L — AB (ref 98–111)
CO2: 35 mmol/L — ABNORMAL HIGH (ref 22–32)
CREATININE: 1.42 mg/dL — AB (ref 0.44–1.00)
Calcium: 8.2 mg/dL — ABNORMAL LOW (ref 8.9–10.3)
GFR calc Af Amer: 45 mL/min — ABNORMAL LOW (ref >60)
GFR calc non Af Amer: 39 mL/min — ABNORMAL LOW (ref >60)
Glucose, Bld: 122 mg/dL — ABNORMAL HIGH (ref 70–99)
Potassium: 4.4 mmol/L (ref 3.5–5.1)
SODIUM: 138 mmol/L (ref 135–145)

## 2018-07-27 LAB — URINALYSIS, ROUTINE W REFLEX MICROSCOPIC
Bilirubin Urine: NEGATIVE
Glucose, UA: NEGATIVE mg/dL
Hgb urine dipstick: NEGATIVE
Ketones, ur: NEGATIVE mg/dL
Leukocytes,Ua: NEGATIVE
Nitrite: NEGATIVE
Protein, ur: NEGATIVE mg/dL
Specific Gravity, Urine: 1.021 (ref 1.005–1.030)
pH: 5 (ref 5.0–8.0)

## 2018-07-27 LAB — PREPARE RBC (CROSSMATCH)

## 2018-07-27 LAB — MAGNESIUM: Magnesium: 2.1 mg/dL (ref 1.7–2.4)

## 2018-07-27 MED ORDER — PRAMIPEXOLE DIHYDROCHLORIDE 1 MG PO TABS
1.0000 mg | ORAL_TABLET | Freq: Two times a day (BID) | ORAL | Status: DC
  Administered 2018-07-27 – 2018-08-04 (×16): 1 mg via ORAL
  Filled 2018-07-27 (×17): qty 1

## 2018-07-27 MED ORDER — PRAMIPEXOLE DIHYDROCHLORIDE 1 MG PO TABS
1.0000 mg | ORAL_TABLET | Freq: Once | ORAL | Status: AC
  Administered 2018-07-27: 1 mg via ORAL
  Filled 2018-07-27: qty 1

## 2018-07-27 MED ORDER — FLUCONAZOLE 150 MG PO TABS
150.0000 mg | ORAL_TABLET | ORAL | Status: DC
  Administered 2018-07-27 – 2018-08-03 (×2): 150 mg via ORAL
  Filled 2018-07-27 (×2): qty 1

## 2018-07-27 MED ORDER — SODIUM CHLORIDE 0.9% IV SOLUTION
Freq: Once | INTRAVENOUS | Status: AC
  Administered 2018-07-30: 16:00:00 via INTRAVENOUS

## 2018-07-27 NOTE — Consult Note (Signed)
Reason for Consult:Right shin hematoma Referring Physician: Florence Canner Narendra  Rachael Robertson is an 68 y.o. female.  HPI: Rachael Robertson has been suffering from a right shin hematoma for a couple of weeks. She is on Xarelto for PE and fell onto that knee and then hit it on a sofa a couple of days later. It's been draining blood since about last Thursday. Orthopedic surgery was consulted for opinion. She notes pain but denies fevers, chills, sweats, N/V. She has had hematomas in the past 2/2 Xarelto.  Past Medical History:  Diagnosis Date  . Obesity   . Opiate use   . Tracheostomy in place [facility](HCC)     Past Surgical History:  Procedure Laterality Date  . Perianal abscess    . TRACHELECTOMY      Family History  Problem Relation Age of Onset  . Aneurysm Mother   . Heart disease Father   . Skin cancer Father   . Sleep apnea Father        TRACHEOSTOMY AS WELL  . Breast cancer Sister     Social History:  reports that she is a non-smoker but has been exposed to tobacco smoke. She has never used smokeless tobacco. She reports current alcohol use. No history on file for drug.  Allergies:  Allergies  Allergen Reactions  . Other Anaphylaxis    Melons  . Fruit & Vegetable Daily [Nutritional Supplements] Diarrhea    Have to be cooked or vine-ripened  . Lactose Intolerance (Gi) Diarrhea  . Naproxen Other (See Comments)    Makes her "congested"  . Soy Allergy Diarrhea  . Sulfa Antibiotics Hives  . Erythromycin Hives and Rash  . Latex Rash  . Mupirocin Rash  . Tape Rash and Other (See Comments)    Plastic tape (alo) BURNS, but Tegaderm is tolerated    Medications: I have reviewed the patient's current medications.  Results for orders placed or performed during the hospital encounter of 07/26/18 (from the past 48 hour(s))  Glucose, capillary     Status: Abnormal   Collection Time: 07/26/18 10:03 PM  Result Value Ref Range   Glucose-Capillary 111 (H) 70 - 99 mg/dL  Basic metabolic panel      Status: Abnormal   Collection Time: 07/27/18  2:55 AM  Result Value Ref Range   Sodium 138 135 - 145 mmol/L   Potassium 4.4 3.5 - 5.1 mmol/L   Chloride 95 (L) 98 - 111 mmol/L   CO2 35 (H) 22 - 32 mmol/L   Glucose, Bld 122 (H) 70 - 99 mg/dL   BUN 50 (H) 8 - 23 mg/dL   Creatinine, Ser 1.611.42 (H) 0.44 - 1.00 mg/dL   Calcium 8.2 (L) 8.9 - 10.3 mg/dL   GFR calc non Af Amer 39 (L) >60 mL/min   GFR calc Af Amer 45 (L) >60 mL/min   Anion gap 8 5 - 15    Comment: Performed at [facility], [address] [place], [place] [address]  CBC     Status: Abnormal   Collection Time: 07/27/18  2:55 AM  Result Value Ref Range   WBC 5.6 4.0 - 10.5 K/uL   RBC 2.64 (L) 3.87 - 5.11 MIL/uL   Hemoglobin 6.9 (LL) 12.0 - 15.0 g/dL    Comment: REPEATED TO VERIFY THIS CRITICAL RESULT HAS VERIFIED AND BEEN CALLED TO E.CHEEK,RN BY MELISSA BROGDON ON 02 18 2020 AT 0329, AND HAS BEEN READ BACK.     HCT 24.4 (L) 36.0 - 46.0 %  MCV 92.4 80.0 - 100.0 fL   MCH 26.1 26.0 - 34.0 pg   MCHC 28.3 (L) 30.0 - 36.0 g/dL   RDW 82.6 (H) 41.5 - 83.0 %   Platelets 170 150 - 400 K/uL   nRBC 0.0 0.0 - 0.2 %    Comment: Performed at [facility], [address] [place], [place] [address]  Magnesium     Status: None   Collection Time: 07/27/18  2:55 AM  Result Value Ref Range   Magnesium 2.1 1.7 - 2.4 mg/dL    Comment: Performed at [facility], [address] [place], [place] [address]  Type and screen [facility]     Status: None (Preliminary result)   Collection Time: 07/27/18  5:46 AM  Result Value Ref Range   ABO/RH(D) O POS    Antibody Screen NEG    Sample Expiration 07/30/2018    Unit Number [id number]    Blood Component Type RED CELLS,LR    Unit division 00    Status of Unit      ISSUED Performed at [facility], [address], [place], [place] [address]    Transfusion Status OK TO TRANSFUSE    Crossmatch Result Compatible   Prepare RBC     Status: None    Collection Time: 07/27/18  5:46 AM  Result Value Ref Range   Order Confirmation ORDER PROCESSED BY BLOOD BANK   Urinalysis, Routine w reflex microscopic     Status: None   Collection Time: 07/27/18  9:25 AM  Result Value Ref Range   Color, Urine YELLOW YELLOW   APPearance CLEAR CLEAR   Specific Gravity, Urine 1.021 1.005 - 1.030   pH 5.0 5.0 - 8.0   Glucose, UA NEGATIVE NEGATIVE mg/dL   Hgb urine dipstick NEGATIVE NEGATIVE   Bilirubin Urine NEGATIVE NEGATIVE   Ketones, ur NEGATIVE NEGATIVE mg/dL   Protein, ur NEGATIVE NEGATIVE mg/dL   Nitrite NEGATIVE NEGATIVE   Leukocytes,Ua NEGATIVE NEGATIVE    Comment: Performed at [facility], [address] [place], [place] [address]    No results found.  Review of Systems  Constitutional: Negative for weight loss.  HENT: Negative for ear discharge, ear pain, hearing loss and tinnitus.   Eyes: Negative for blurred vision, double vision, photophobia and pain.  Respiratory: Negative for cough, sputum production and shortness of breath.   Cardiovascular: Negative for chest pain.  Gastrointestinal: Negative for abdominal pain, nausea and vomiting.  Genitourinary: Negative for dysuria, flank pain, frequency and urgency.  Musculoskeletal: Positive for joint pain (Right shin). Negative for back pain, falls, myalgias and neck pain.  Neurological: Negative for dizziness, tingling, sensory change, focal weakness, loss of consciousness and headaches.  Endo/Heme/Allergies: Does not bruise/bleed easily.  Psychiatric/Behavioral: Negative for depression, memory loss and substance abuse. The patient is not nervous/anxious.    Blood pressure (!) 112/50, pulse 65, temperature 98.7 F (37.1 C), temperature source Oral, resp. rate 16, SpO2 98 %. Physical Exam  Constitutional: She appears well-developed and well-nourished. No distress.  HENT:  Head: Normocephalic and atraumatic.  Eyes: Conjunctivae are normal. Right eye exhibits no discharge. Left  eye exhibits no discharge. No scleral icterus.  Neck: Normal range of motion.  Cardiovascular: Normal rate and regular rhythm.  Respiratory: Effort normal. No respiratory distress.  Musculoskeletal:     Comments: RLE No traumatic wounds, ecchymosis, or rash  Large ulceration shin with 2x5cm full thickness defect. Able to extrude ~530ml  clot/old blood from wound.  No obvious knee or ankle effusion  Knee stable to varus/ valgus and anterior/posterior stress  Sens DPN, SPN, TN intact  Motor EHL, ext, flex, evers 5/5  DP 2+, PT 1+, No significant edema  Neurological: She is alert.  Skin: Skin is warm and dry. She is not diaphoretic.  Psychiatric: She has a normal mood and affect. Her behavior is normal.    Assessment/Plan: Right lower leg hematoma -- As there was already a full thickness wound was able to express a large portion of the hematoma. I expect it will continue to drain over the next several days as the rest of the hematoma liquefies. I have put a mildly compressive dressing over area to try and keep it from reaccumulating. I will consult WOC RN as she will need ongoing wound care and likely referral to the wound center. I would encourage early involvement of Dr. Ulice Bold (plastics/wound) as I believe she will need ongoing management.     Freeman Caldron, PA-C Orthopedic Surgery [phone] 07/27/2018, 10:24 AM

## 2018-07-27 NOTE — Discharge Summary (Addendum)
Name: Rachael Robertson MRN: [id number] DOB: 10-09-1950 68 y.o. PCP: Lorenso Courier, MD  Date of Admission: 07/26/2018  3:45 PM Date of Discharge: 08/04/18 Attending Physician: Earl Lagos, MD  Discharge Diagnosis: 1. Right shin hematoma 2. Right lower quadrant abdominal mass 3. 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 Frequent PVCs 5. Chronic hypercapnic respiratory failure, OSA, OHS, Chronic trach 6. Intertrigo 7. Gout flare  Discharge Medications: Allergies as of 08/04/2018      Reactions   Other Anaphylaxis   Melons   Fruit & Vegetable Daily [nutritional Supplements] Diarrhea   Have to be cooked or vine-ripened   Lactose Intolerance (gi) Diarrhea   Naproxen Other (See Comments)   Makes her "congested"   Sulfa Antibiotics Hives   Erythromycin Hives, Rash   Latex Rash   Mupirocin Rash   Tape Rash, Other (See Comments)   Plastic tape (alo) BURNS, but Tegaderm is tolerated      Medication List    STOP taking these medications   bumetanide 2 MG tablet Commonly known as:  BUMEX   carvedilol 3.125 MG tablet Commonly known as:  COREG   cephALEXin 500 MG capsule Commonly known as:  KEFLEX   rivaroxaban 10 MG Tabs tablet Commonly known as:  XARELTO   spironolactone 100 MG tablet Commonly known as:  ALDACTONE     TAKE these medications   acetaminophen 650 MG CR tablet Commonly known as:  TYLENOL Take 1,300 mg by mouth 2 (two) times daily.   albuterol 108 (90 Base) MCG/ACT inhaler Commonly known as:  PROVENTIL HFA;VENTOLIN HFA Inhale 1-2 puffs into the lungs every 6 (six) hours as needed for wheezing or shortness of breath.   allopurinol 100 MG tablet Commonly known as:  ZYLOPRIM Take 100 mg by mouth 2 (two) times daily.   budesonide-formoterol 160-4.5 MCG/ACT inhaler Commonly known as:  SYMBICORT Inhale 2 puffs into the lungs 2 (two) times daily.   colchicine 0.6 MG tablet Take 1 tablet (0.6 mg total) by mouth daily.   DAILY PROBIOTIC  Caps Take 1 tablet by mouth daily.   diclofenac sodium 1 % Gel Commonly known as:  VOLTAREN Apply 2 g topically 4 (four) times daily. What changed:    how much to take  when to take this  additional instructions   ferrous sulfate 325 (65 FE) MG tablet Take 1 tablet (325 mg total) by mouth daily with breakfast.   fluconazole 150 MG tablet Commonly known as:  DIFLUCAN Take 1 tablet (150 mg total) by mouth once a week.   Gerhardt's butt cream Crea Apply 1 application topically 2 (two) times daily.   levothyroxine 50 MCG tablet Commonly known as:  SYNTHROID, LEVOTHROID Take 50 mcg by mouth daily before breakfast.   loperamide 2 MG tablet Commonly known as:  IMODIUM A-D Take 2 mg by mouth 4 (four) times daily as needed for diarrhea or loose stools.   metoprolol tartrate 25 MG tablet Commonly known as:  LOPRESSOR Take 0.5 tablets (12.5 mg total) by mouth 2 (two) times daily. On when her systolic BP is >100   omeprazole 20 MG capsule Commonly known as:  PRILOSEC Take 20 mg by mouth daily.   potassium chloride SA 20 MEQ tablet Commonly known as:  K-DUR,KLOR-CON Take 40 mEq by mouth daily.   pramipexole 1 MG tablet Commonly known as:  MIRAPEX Take 1 mg by mouth 2 (two) times daily.   silver sulfADIAZINE 1 % cream Commonly known as:  SILVADENE Apply topically daily.  traMADol 50 MG tablet Commonly known as:  ULTRAM Take 1 tablet (50 mg total) by mouth every 6 (six) hours as needed for moderate pain.       Disposition and follow-up:   [name] was discharged from [facility] in Fair condition.  At the hospital follow up visit please address:  1.  Right shin hematoma with wound vac: She should have her wound vac changed 2x per day, follow up with plastic surgery in 2 weeks. We stopped her Xeralto.  Physical therapy: Goal is to get her up and able to walk around, please provide maximal physical therapy. She has used a Conservator, museum/gallery while she  has been here and when she was at home, please use this or a similar device. Supplements: Patient may take probiotics (Garden of Life), psyllium, Youngevity Sweet Eze beyond tangerine, Beyond Osteo FX, Ultimate EF A +, and Projoint FX  Right lower quadrant abdominal mass: She needs to follow up with general surgery to assess if this should be removed, this has been present for at least 1 year.  Frequent PVCs: Held carvedilol on discharge, started metoprolol 12.5 mg BID if systolic >100.  Gout: She was discharged on allopurinol and colchicine, please check urate levels on follow up to see if colchicine can be stopped.   2.  Labs / imaging needed at time of follow-up: CBC, BMP, urate acid  3.  Pending labs/ test needing follow-up: None  Follow-up Appointments:  Contact information for follow-up providers    [facility], [place]. Call.   Specialty:  General Surgery Why:  call to schedule an appointment regarding abdominal mass Contact information: [address] [place] [address] [phone]       Lorenso Courier, MD. Schedule an appointment as soon as possible for a visit in 2 week(s).   Specialty:  Internal Medicine Contact information: [address] [place] [address] [phone]        Peggye Form, DO. Schedule an appointment as soon as possible for a visit in 2 week(s).   Specialty:  Plastic Surgery Contact information: [address] [place] [address] [phone]            Contact information for after-discharge care    Destination    [facility] Preferred SNF .   Service:  Skilled Nursing Contact information: [address] [place] [address] [phone]                  Hospital Course by problem list: 1. Right shin hematoma: This is a 68 year old morbidly obese female with chronic hypercapnia secondary to OHS, OSA on chronic trach, tracheal spasm,  HFrEF, CKD stage III, history of PE on Xarelto who presented with worsening right shin hematoma. The started after a traumatic injury where she fell on her legs and then a few days later hit her leg against the sofa. This was noted on her last admission and had been worsening. Hgb was 6.9, down from 8.9 on admission, she was given 1 unit pRBC and her Gibson Ramp was held. Orthopedics was consulted and they extruded 500 cc at the bedside, they recommended plastic surgery consult. Plastic surgery was consulted and they took her to the OR on 2/20 for evacuation of the hematoma. They placed a wound vac on it and recommended follow up in 1 week after discharge. She tolerated the procedure well and her wound vac was continued on discharge. PT  and OT recommended SNF.   2. Right lower quadrant abdominal mass: Noted about 1 year ago, she was told that it was a reaction to her lovenox shots and that she had it on her left side too but that went away. She reports that it greatly interferes with her life, she has difficulty sitting up and breathing because of it. CT abdomen pelvis with contrast showed a partially visualized complex fluid collection measuring at least 18 cm in diameter, and a smaller hyperdense fluid collection measuring 4.6 cm. Plastic surgery evaluated and recommended general surgery consult. Since it's no an urgent matter general surgery recommended that she follow up with them outpatient and put information in the chart.   3. 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 On her last admission we had discussed that she may need to stop anticoagulation however she decided that her risk of clots were worse then her risk of bleeding. She had an isolated PE event in 2007, she has been on anticoagulation since this time. She had another episode of hemoptysis on her last admission and she decided to resume xeralto at a lower dose  then normal. Gibson RampXeralto was held on hospitalization and discontinued on discharge.   4. Frequent PVCs: Noted on telemetry, she has a history of frequent PVCs. She is on carvedilol at home that she only takes if her blood pressure allows. Given the frequency of her PVCs there is a concern for development of a cardiomyopathy.Metoprolol was started however she was having some hypotension so this was held.She was discharged with metoprolol tartrate 12.5 with hold parameters if systolic of <100.  5. Chronic hypercapnic respiratory failure, OSA, OHS, Chronic trach: On ventilator at home, 4L O2 at night and 2L during the day. Place on BiPAP overnight. Pulmonology was assisting with managing the ventilator.  6. Intertrigo: Treated with powder and fluconazole weekly, should received 4 weeks worth, 3rd dose was on 2/25.  7. Gout flare: Left hand 1st metatarsal area was warm, red and tender on admisison. She had not been taking her allopurinol. She was started on colchicine and her hand improved. Allopurinol was restarted on discharge along with colchicine.    Discharge Vitals:   BP (!) 107/34 (BP Location: Left Wrist)   Pulse (!) 59   Temp 98.1 F (36.7 C) (Oral)   Resp 18   SpO2 97%   Pertinent Labs, Studies, and Procedures:  CBC Latest Ref Rng & Units 08/02/2018 08/01/2018 07/31/2018  WBC 4.0 - 10.5 K/uL 4.8 5.5 7.0  Hemoglobin 12.0 - 15.0 g/dL 8.0(L) 7.5(L) 7.5(L)  Hematocrit 36.0 - 46.0 % 27.1(L) 26.1(L) 26.2(L)  Platelets 150 - 400 K/uL 190 177 176   BMP Latest Ref Rng & Units 08/02/2018 08/01/2018 07/31/2018  Glucose 70 - 99 mg/dL 79 87 161(W110(H)  BUN 8 - 23 mg/dL 96(E32(H) 45(W37(H) 09(W36(H)  Creatinine 0.44 - 1.00 mg/dL 1.19(J1.13(H) 4.78(G1.33(H) 9.56(O1.18(H)  BUN/Creat Ratio 12 - 28 - - -  Sodium 135 - 145 mmol/L 138 138 137  Potassium 3.5 - 5.1 mmol/L 4.6 4.7 5.0  Chloride 98 - 111 mmol/L 99 98 95(L)  CO2 22 - 32 mmol/L 31 28 33(H)  Calcium 8.9 - 10.3 mg/dL 8.1(L) 8.0(L) 8.3(L)   07/30/18 CT  abd/pelvis:  IMPRESSION: 1. Partially visualized large right lateral abdominal wall subcutaneous complex fluid collection which measures at least 18 cm in diameter. Adjacent smaller hyperdense fluid collection is seen more medially measuring 4.6 cm.  2. Streaky airspace consolidation versus atelectasis in the bilateral lung bases. 3. No evidence of acute abnormalities within the solid abdominal organs.  Discharge Instructions: Discharge Instructions    Call MD for:  difficulty breathing, headache or visual disturbances   Complete by:  As directed    Call MD for:  extreme fatigue   Complete by:  As directed    Call MD for:  hives   Complete by:  As directed    Call MD for:  persistant dizziness or light-headedness   Complete by:  As directed    Call MD for:  persistant nausea and vomiting   Complete by:  As directed    Call MD for:  redness, tenderness, or signs of infection (pain, swelling, redness, odor or green/yellow discharge around incision site)   Complete by:  As directed    Call MD for:  severe uncontrolled pain   Complete by:  As directed    Call MD for:  temperature >100.4   Complete by:  As directed    Diet - low sodium heart healthy   Complete by:  As directed    Discharge instructions   Complete by:  As directed    [facility],   It has been a pleasure working with you and we are glad you're feeling better. You were hospitalized for a right shin hematoma, the blood was evacuated by plastic surgery and a wound vac was placed to help with wound healing. Please follow up with Dr. Ulice Bold in 1-2 weeks to assess your wound. You need to have your twice weekly dressing changes. Because of your frequent episodes of bleeding and significant drop in your hemoglobin please stop taking your Xeralto. For your gout, please restart your allopurinol and start taking colchicine until you follow up with your PCP, we will check your uric acid levels at that time to see if you  can stop colchicine.     For your abdominal mass, please follow up with surgery to see if this is surgically removable and to discuss your options with them.   We also noted that your blood pressures were low while you were here, please stop taking the spironolactone and the carvedilol for now until you follow up with your PCP. We started you on metoprolol for your abnormal heart rhythm, please only take this when your systolic blood pressure is >100.   Follow up with your primary care provider in 1-2 weeks  If your symptoms worsen or you develop new symptoms, please seek medical help whether it is your primary care provider or emergency department.  If you have any questions about this hospitalization please call [phone].   Increase activity slowly   Complete by:  As directed       Signed: [name], MD 08/04/2018, 11:05 AM   Pager: [phone]

## 2018-07-27 NOTE — Progress Notes (Signed)
Placed patient on AVAPS for the night with Vt=500,Max pressure at 25cm,EPAP=5cm, fio2 set at 35%.

## 2018-07-27 NOTE — Care Management (Signed)
PTA pt active with [facility] for [facility] Informed CM they will not accept pt back for home health as it is felt that pt is not appropriate for home health due to overall lack of support in the home.  Attending made aware

## 2018-07-27 NOTE — Progress Notes (Signed)
eLink Physician-Brief Progress Note Patient Name: Rachael Robertson DOB: [birth year] MRN: [id number]   Date of Service  07/27/2018  HPI/Events of Note  Anemia - Hgb = 6.9.   eICU Interventions  Will order: 1. Transfuse 1 unit PRBC now.         Sommer,Steven Dennard Nip 07/27/2018, 3:58 AM

## 2018-07-27 NOTE — Progress Notes (Signed)
eLink Physician-Brief Progress Note Patient Name: Rachael Robertson DOB: 03/04/1951 MRN: [id number]   Date of Service  07/27/2018  HPI/Events of Note  Patient has restless leg syndrome and takes Mirapex at home. Currently NPO.   eICU Interventions  Will order: 1. Mirapex 1 mg PO with small sip of water X 1 now.      Intervention Category Major Interventions: Other:  Sommer,Steven Dennard Nip 07/27/2018, 2:03 AM

## 2018-07-27 NOTE — Progress Notes (Signed)
  Date: 07/27/2018  Patient name: Rachael Robertson  Medical record number: [id number]  Date of birth: Oct 11, 1950   I have seen and evaluated [facility] and discussed their care with the Residency Team.  In brief, patient is a 68 year old female with a past medical history of chronic respiratory failure secondary to OHS/OSA requiring nocturnal ventilation via tracheostomy, chronic systolic heart failure with an EF of 45 to 50%, CKD stage III, history of PE on anticoagulation who presented to the internal medicine clinic with worsening right shin hematoma.  Patient was recently in the hospital for acute blood loss anemia and was discharged on February 13.  She was noted to have a hematoma that time but denied any knee pain.  Patient states that she developed a hematoma after a fall from her chair and also ran into herself a few days later.  In the interim medicine clinic she was noted to have an enlarging hematoma with worsening pain and was admitted for further evaluation.  Patient also complains of pain in her left hand which felt like an acute gout flare.  Patient has not been taking her allopurinol or her Xarelto since her discharge as she was unable to pick up her medications.  No chest pain, no shortness of breath, no palpitations, no lightheadedness, no syncope, no focal weakness, no tingling or numbness, no abdominal pain, no nausea or vomiting, no diarrhea.  Today patient states that she feels well with no new complaints.  She does have some mild pain in her right leg.  PMHx, Fam Hx, and/or Soc Hx : As per resident admit note  Vitals:   07/27/18 1400 07/27/18 1500  BP: (!) 131/102 (!) 143/113  Pulse: 60 (!) 57  Resp: 16 16  Temp:    SpO2: 100%    General: Awake, alert, oriented x3, NAD CVS: Regular rate and rhythm, normal heart sounds Lungs: CTA bilaterally Abdomen: Soft, obese, nontender, nondistended, normoactive bowel sounds, candidal rash noted in her intertriginous  areas. Extremities: Large hematoma noted over her right shin with some oozing of dark red blood.  Both lower extremities appear warm with dorsalis pedis pulses palpable bilaterally.  Assessment and Plan: I have seen and evaluated the patient as outlined above. I agree with the formulated Assessment and Plan as detailed in the residents' note, with the following changes:   1.  Right shin hematoma: -Patient is admitted to the hospital from internal medicine clinic secondary to enlarging right hematoma with worsening right leg pain.  Hematoma is likely secondary to trauma obtained from a fall as well as hitting her leg against a sofa in the setting of being on chronic anticoagulation. -Patient's hemoglobin was 6.9 today (down from 8.9 on admission).  This is likely acute blood loss anemia secondary to her large right shin hematoma -We will transfuse 1 unit PRBC for now and follow-up posttransfusion CBC -Orthopedics follow-up and recommendations appreciated.  Approximately 500 cc of dark red blood was evacuated from her right leg through her right leg wound. -Wound care follow-up and recommendations appreciated -We will follow-up plastic surgery consult -Continue to hold Xarelto for now.  I believe that patient is a poor candidate for chronic anticoagulation given her recurrent episodes of acute blood loss anemia -No further work-up at this time -Continue with vent management per pulm  Earl Lagos, MD 2/18/20204:31 PM

## 2018-07-27 NOTE — Progress Notes (Signed)
Internal Medicine Clinic Attending  I saw and evaluated the patient.  I personally confirmed the key portions of the history and exam documented by Dr. Criss Alvine and I reviewed pertinent patient test results.  The assessment, diagnosis, and plan were formulated together and I agree with the documentation in the resident's note.  Patient living with morbid obesity here for hospital follow up. On entering the room I first noticed frank bleeding from her right leg, through a bandage, and pooling in her shoe and on the floor. The right anterior shin has a 20cm tense hematoma within a panus. At the distal end of this hematoma there is a new 2cm ulcer in the skin, with exposed soft hematoma deep, which is the source of the slow constant bleeding. This ulcer has worsened since her discharge. I think because the hematoma is undermining the dermis, and due to her obesity, this is high risk for non-healing and worsened skin breakdown. We decided to directly admit her for monitoring blood counts, orthopedics consultation, and I hope a hematoma evacuation.

## 2018-07-27 NOTE — Progress Notes (Signed)
[facility] PCCM   Chart reviewed, I agree with Dr. Orpah Melter note and recommendatoins  PCCM available PRN  Heber Kingfisher, MD [place] [facility]M Pager: [phone] Cell: [phone] If no response, call [phone]

## 2018-07-27 NOTE — Progress Notes (Signed)
Placed patient on home settings of AVAPS with Vt=500, Maximum pressure 25cm and minimum pressure set at 5cm. Will continue to monitor patient.

## 2018-07-27 NOTE — Progress Notes (Signed)
CRITICAL VALUE ALERT  Critical Value:  Hgb 6.9  Date & Time Notied:  07/27/2018 @ 0345  Provider Notified: Pola Corn  Orders Received/Actions taken:

## 2018-07-27 NOTE — Progress Notes (Signed)
Subjective: She is generally doing ok and endorses that breathing is ok. Mentions that she is trying to excercising her feet when on the bed, with that she has some mild pain on her leg. She confirms that she has not taken Xarelto since last discharge. Also did not take Alluporinol for past couple of days. She denies any burning when she pees or abdominal pain. She denies any pain today. We discussed that we will give her some blood and will consult orthopedic surgery to come and see her leg. Rachael Robertson agrees with plan.   Objective:  Vital signs in last 24 hours: Vitals:   07/27/18 0429 07/27/18 0430 07/27/18 0502 07/27/18 0600  BP:   (!) 109/57 99/75  Pulse: 75  60   Resp: (!) 21  14 16   Temp:      TempSrc:      SpO2: 100% 100% 98%     General: Chronically ill appearing, NAD, trach in place Cardiac: RRR, no m/r/g Pulmonary: CTABL, no wheezing or rhonchi Abdomen: Obese abdomen, firm area over RLQ Extremity:Right shin: large hematoma over midshin, central areas of ulceration, oozing out dark red blood. Warm extremities and 1+ pulses bilaterally. Left 1 mcp joint: erythematous, edematous, tender    Assessment/Plan:  Active Problems:   Chronic respiratory failure with hypoxia (HCC)   Tracheostomy status (HCC)   Subcutaneous hematoma  [name] with a history of chronic hypercapnea 2/2 OHS, OSA on chronic trach (on 2L supplemental O2 via trilogy ventilator), HFrEF (EF 45-50%), CKD stage 3, history of PE on Xarelto, and chronic skin bruising who presented from clinic for worsening right shin hematoma and anemia. Found to have normal vital signs. Initial CBC showed Hgb 8.9, Hgb on discharge was 7.6.   Right shin hematoma:  Anemia:  This morning patients hemoglobin was 6.9, dropped from 8.9 on admission. She has not been taking her Gibson Ramp and it's worrisome that she is having bleeding into her right shin hematoma. Today her shin looks about the same as yesterday,  still significantly enlarged and oozing out red blood. She had good pulses and her extremities were warm. Less concerning for any compression at this time however still may benefit from extrusion.  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 -Pulmonology consult for assistance with trach management, appreciate assistance -Continue albuterol PRN -Continue ventilator at night -Limit suctioning given history of hemoptysis  History of UTI, proteus mirabilis: -Repeat U/A today  Intertrigo: -Continue topical nystatin -Fluconazole today, will need weekly fluconazole dose, first dose was 2/11.  -2nd dose today, 2/18  Acute gout of left thumb: -Left hand is improving, she reports that the pain is still present but has had some improvement.  -Continue colchicine -Hold allopurinol  Hypothyroidism: Continue synthroid 50 mcg  daily GERD: Conitnue home PPI RLS: Continue home pramipexole  FEN: No fluids, replete lytes prn, NPO VTE ppx: Hold due to bleeding Code Status: FULL    Dispo: Anticipated discharge is pending clinical improvement   Rachael Robertson, Rachael Robertson 07/27/2018, 6:17 AM Pager: [phone]

## 2018-07-27 NOTE — Consult Note (Signed)
WOC Nurse wound consult note Reason for Consult: hematoma Evaluated by ortho and hematoma evacuated per PA. Requested that WOC nurse assess for long term wound care needs Wound type: hematoma with skin necrosis Pressure Injury POA: NA Measurement: est. 10cm x 8cm x 0.1cm  Wound ZOX:WRUEAVWU tissue/eschar with active oozing at the distal aspect of the most distal wound that is lateral pretibial  Drainage (amount, consistency, odor) dark, blood Periwound: large edematous, LEs bilaterally, most likely lymphedema component  Dressing procedure/placement/frequency: Added calcium alginate for hemostatic properties, cover with ABD pads, Secure with ACE, change and assess wound daily.   Discussed POC with patient and bedside nurse.  Re consult if needed, will not follow at this time. Thanks  Sharon Stapel [employer], RN,CWOCN, CNS, CWON-AP [phone])

## 2018-07-27 NOTE — Consult Note (Signed)
NAME:  Rachael Robertson, MRN:  [id number], DOB:  [birth year], LOS: 1 ADMISSION DATE:  07/26/2018, CONSULTATION DATE:  2/18 REFERRING MD:  Dr. Lynnea FerrierNarendra IMTS, CHIEF COMPLAINT:  Chronic Vent   Brief History   68 year old female with chronic vent on nocturnal ventilation for OSA/OHS  History of present illness   68 year old female with PMH as below, which is significant for chronic respiratory failure secondary to OHS/OSA requiring nocturnal ventilation via tracheostomy. She is followed by Dr. Wynona Neatlalere and Anders SimmondsPete Babcock NP. She was recently admitted for anemia secondary to R knee hematoma. She continued anticoagulation, but at a decreased dose and was discharged to home on 2/13. Now 2/17 she is again presenting to [facility] with complaints of shin hematoma and bleeding. She was admitted for anemia secondary to hematoma and ortho was consulted. PCCM was asked to assist with vent management.   Past Medical History   has a past medical history of Obesity, Opiate use, and Tracheostomy in place [facility](HCC).  Significant Hospital Events     Consults:  PCCM Ortho  Procedures:    Significant Diagnostic Tests:    Micro Data:    Antimicrobials:     Interim history/subjective:    Objective   Blood pressure (!) 157/144, pulse 71, temperature 98.8 F (37.1 C), temperature source Oral, resp. rate 18, SpO2 97 %.    FiO2 (%):  [28 %] 28 %   Intake/Output Summary (Last 24 hours) at 07/27/2018 0007 Last data filed at 07/27/2018 0000 Gross per 24 hour  Intake 440 ml  Output 225 ml  Net 215 ml   There were no vitals filed for this visit.  Examination: General: Morbidly obese female in NAD HENT: Crane/AT, PERRL, 6 cuffless trach in place.  Lungs: clear, distant Cardiovascular: RRR, no MRG Abdomen: Soft, non-tender, non-distended Neuro: Alert oriented, non-focal.    Resolved Hospital Problem list     Assessment & Plan:   Anemia secondary to R shin hematoma - Ortho consulted - Per  primary - May be time to stop anticoagulation.  PE was reportedly isolated event in 2007.  Chronic respiratory failure secondary to OHS/OSA: she requires Trilogy vent nocturnally via trach and has been rather compliant according to recent clinic notes.  - Nocturnal AVAPS with home settings (AVAPS/Vt500/f10/Min5/Max25) - I have discussed this with RT who are making the appropriate changes.  - PRN albuterol - No plans for downsize or decannulation. [name] will likely be life long barring significant weight loss.    Best practice:  Diet: Per primary Pain/Anxiety/Delirium protocol (if indicated): NA VAP protocol (if indicated): NA DVT prophylaxis: per primary GI prophylaxis: Per priamry Glucose control: Per primary Mobility: Per primary Code Status: FULL Family Communication: Patient updated bedside in ICU Disposition: OK for transition to PCU from pulmonary standpoint.  Labs   CBC: Recent Labs  Lab 07/20/18 0250 07/20/18 1918 07/21/18 0610 07/22/18 0353 07/26/18 1400  WBC 6.8  --  5.4 5.3 6.9  NEUTROABS  --   --   --   --  5.5  HGB 7.1* 7.4* 7.4* 7.6* 8.9*  HCT 23.9* 25.3* 25.5* 26.1* 31.3*  MCV 92.3  --  92.1 93.2 93.2  PLT 193  --  175 177 235    Basic Metabolic Panel: Recent Labs  Lab 07/20/18 0250 07/21/18 0610 07/26/18 1400  NA 138 139 136  K 3.9 3.7 4.8  CL 93* 91* 95*  CO2 34* 40* 31  GLUCOSE 86 99 130*  BUN 74*  54* 45*  CREATININE 1.63* 1.24* 1.38*  CALCIUM 8.3* 8.2* 8.9   GFR: Estimated Creatinine Clearance: 68.1 mL/min (A) (by C-G formula based on SCr of 1.38 mg/dL (H)). Recent Labs  Lab 07/20/18 0250 07/21/18 0610 07/22/18 0353 07/26/18 1400  WBC 6.8 5.4 5.3 6.9    Liver Function Tests: No results for input(s): AST, ALT, ALKPHOS, BILITOT, PROT, ALBUMIN in the last 168 hours. No results for input(s): LIPASE, AMYLASE in the last 168 hours. No results for input(s): AMMONIA in the last 168 hours.  ABG    Component Value Date/Time   PHART  7.393 06/11/2018 1100   PCO2ART 69.7 (HH) 06/11/2018 1100   PO2ART 69.1 (L) 06/11/2018 1100   HCO3 41.6 (H) 06/11/2018 1100   O2SAT 94.3 06/11/2018 1100     Coagulation Profile: No results for input(s): INR, PROTIME in the last 168 hours.  Cardiac Enzymes: No results for input(s): CKTOTAL, CKMB, CKMBINDEX, TROPONINI in the last 168 hours.  HbA1C: No results found for: HGBA1C  CBG: Recent Labs  Lab 07/20/18 1718 07/20/18 1958 07/21/18 0000 07/26/18 2203  GLUCAP 82 123* 118* 111*    Review of Systems:   Bolds are positive  Constitutional: weight loss, gain, night sweats, Fevers, chills, fatigue .  HEENT: headaches, Sore throat, sneezing, nasal congestion, post nasal drip, Difficulty swallowing, Tooth/dental problems, visual complaints visual changes, ear ache CV:  chest pain, radiates:,Orthopnea, PND, swelling in lower extremities, dizziness, palpitations, syncope.  GI  heartburn, indigestion, abdominal pain, nausea, vomiting, diarrhea, change in bowel habits, loss of appetite, bloody stools.  Resp: cough, productive: , hemoptysis, dyspnea, chest pain, pleuritic.  Skin: rash or itching or icterus GU: dysuria, change in color of urine, urgency or frequency. flank pain, hematuria  MS: R shin and knee swelling, and bleeding. decreased range of motion  Psych: change in mood or affect. depression or anxiety.  Neuro: difficulty with speech, weakness, numbness, ataxia    Past Medical History  She,  has a past medical history of Obesity, Opiate use, and Tracheostomy in place [facility]).   Surgical History    Past Surgical History:  Procedure Laterality Date  . Perianal abscess    . TRACHELECTOMY       Social History   reports that she is a non-smoker but has been exposed to tobacco smoke. She has never used smokeless tobacco. She reports current alcohol use.   Family History   Her family history includes Aneurysm in her mother; Breast cancer in her sister; Heart disease in her  father; Skin cancer in her father; Sleep apnea in her father.   Allergies Allergies  Allergen Reactions  . Other Anaphylaxis    Melons  . Fruit & Vegetable Daily [Nutritional Supplements] Diarrhea    Have to be cooked or vine-ripened  . Lactose Intolerance (Gi) Diarrhea  . Naproxen Other (See Comments)    Makes her "congested"  . Soy Allergy Diarrhea  . Sulfa Antibiotics Hives  . Erythromycin Hives and Rash  . Latex Rash  . Mupirocin Rash  . Tape Rash and Other (See Comments)    Plastic tape (alo) BURNS, but Tegaderm is tolerated     Home Medications  Prior to Admission medications   Medication Sig Start Date End Date Taking? Authorizing Provider  acetaminophen (TYLENOL) 650 MG CR tablet Take 1,300 mg by mouth 2 (two) times daily.    [provider]  albuterol (PROVENTIL HFA;VENTOLIN HFA) 108 (90 Base) MCG/ACT inhaler Inhale 1-2 puffs into the lungs every 6 (  six) hours as needed for wheezing or shortness of breath.    [provider]  allopurinol (ZYLOPRIM) 100 MG tablet Take 100 mg by mouth 2 (two) times daily.    [provider]  budesonide-formoterol (SYMBICORT) 160-4.5 MCG/ACT inhaler Inhale 2 puffs into the lungs 2 (two) times daily.    [provider]  bumetanide (BUMEX) 2 MG tablet Take 4 mg by mouth daily.     [provider]  carvedilol (COREG) 3.125 MG tablet Take 3.125 mg by mouth See admin instructions. Take 3.125 mg by mouth two times a day with meals ONLY IF SYSTOLIC NUMBER IS 100 OR GREATER    [provider]  cephALEXin (KEFLEX) 500 MG capsule Take 1 capsule (500 mg total) by mouth 2 (two) times daily for 10 days. 07/22/18 08/01/18  Dorrell, [name], MD  diclofenac sodium (VOLTAREN) 1 % GEL Apply 2 g topically 4 (four) times daily. Patient taking differently: Apply 2-4 g topically See admin instructions. Apply 2-4 grams topically to knees two times a day 05/20/18   Rehman, Areeg N, DO  ferrous sulfate 325 (65 FE)  MG tablet Take 1 tablet (325 mg total) by mouth daily with breakfast. 07/22/18 07/22/19  Dorrell, [name], MD  fluconazole (DIFLUCAN) 150 MG tablet Take 1 tablet (150 mg total) by mouth once a week. 07/27/18   Dorrell, [name], MD  Hydrocortisone (GERHARDT'S BUTT CREAM) CREA Apply 1 application topically 2 (two) times daily. 07/22/18   Dorrell, [name], MD  levothyroxine (SYNTHROID, LEVOTHROID) 50 MCG tablet Take 50 mcg by mouth daily before breakfast.    [provider]  omeprazole (PRILOSEC) 20 MG capsule Take 20 mg by mouth daily.    [provider]  potassium chloride SA (K-DUR,KLOR-CON) 20 MEQ tablet Take 40 mEq by mouth daily.     [provider]  pramipexole (MIRAPEX) 1 MG tablet Take 1 mg by mouth 2 (two) times daily.    [provider]  rivaroxaban (XARELTO) 10 MG TABS tablet Take 1 tablet (10 mg total) by mouth daily. 07/22/18   Dorrell, [name], MD  silver sulfADIAZINE (SILVADENE) 1 % cream Apply topically daily. 07/06/18   Chundi, Sherlyn Lees, MD  spironolactone (ALDACTONE) 100 MG tablet Take 100 mg by mouth daily.    [provider]  traMADol (ULTRAM) 50 MG tablet Take 1 tablet (50 mg total) by mouth every 6 (six) hours as needed for moderate pain. 07/09/18   Fayrene Helper, PA-C     Critical care time:      Joneen Roach, AGACNP-BC [facility] Pulmonary/Critical Care Pager [phone] or [phone]  07/27/2018 12:31 AM

## 2018-07-28 DIAGNOSIS — S8011XA Contusion of right lower leg, initial encounter: Secondary | ICD-10-CM

## 2018-07-28 LAB — CBC
HCT: 27.5 % — ABNORMAL LOW (ref 36.0–46.0)
Hemoglobin: 8.2 g/dL — ABNORMAL LOW (ref 12.0–15.0)
MCH: 27.5 pg (ref 26.0–34.0)
MCHC: 29.8 g/dL — ABNORMAL LOW (ref 30.0–36.0)
MCV: 92.3 fL (ref 80.0–100.0)
PLATELETS: 186 10*3/uL (ref 150–400)
RBC: 2.98 MIL/uL — AB (ref 3.87–5.11)
RDW: 15.7 % — ABNORMAL HIGH (ref 11.5–15.5)
WBC: 3.9 10*3/uL — ABNORMAL LOW (ref 4.0–10.5)
nRBC: 0 % (ref 0.0–0.2)

## 2018-07-28 LAB — BASIC METABOLIC PANEL
Anion gap: 9 (ref 5–15)
BUN: 39 mg/dL — ABNORMAL HIGH (ref 8–23)
CALCIUM: 8.7 mg/dL — AB (ref 8.9–10.3)
CO2: 34 mmol/L — ABNORMAL HIGH (ref 22–32)
Chloride: 94 mmol/L — ABNORMAL LOW (ref 98–111)
Creatinine, Ser: 1.08 mg/dL — ABNORMAL HIGH (ref 0.44–1.00)
GFR calc Af Amer: >60 mL/min (ref >60)
GFR calc non Af Amer: 54 mL/min — ABNORMAL LOW (ref >60)
Glucose, Bld: 86 mg/dL (ref 70–99)
Potassium: 4.7 mmol/L (ref 3.5–5.1)
Sodium: 137 mmol/L (ref 135–145)

## 2018-07-28 LAB — BPAM RBC
Blood Product Expiration Date: 202003162359
ISSUE DATE / TIME: 202002180846
Unit Type and Rh: 5100

## 2018-07-28 LAB — TYPE AND SCREEN
ABO/RH(D): O POS
Antibody Screen: NEGATIVE
Unit division: 0

## 2018-07-28 MED ORDER — DICLOFENAC SODIUM 1 % TD GEL
2.0000 g | Freq: Four times a day (QID) | TRANSDERMAL | Status: DC
  Administered 2018-07-28 – 2018-08-04 (×25): 2 g via TOPICAL
  Filled 2018-07-28 (×2): qty 100

## 2018-07-28 MED ORDER — IPRATROPIUM-ALBUTEROL 0.5-2.5 (3) MG/3ML IN SOLN
3.0000 mL | RESPIRATORY_TRACT | Status: DC | PRN
  Administered 2018-07-28 – 2018-08-03 (×3): 3 mL via RESPIRATORY_TRACT
  Filled 2018-07-28 (×2): qty 3

## 2018-07-28 MED ORDER — BUMETANIDE 2 MG PO TABS
2.0000 mg | ORAL_TABLET | Freq: Every day | ORAL | Status: DC
  Administered 2018-07-28 – 2018-07-29 (×2): 2 mg via ORAL
  Filled 2018-07-28 (×3): qty 1

## 2018-07-28 MED ORDER — SILVER SULFADIAZINE 1 % EX CREA
TOPICAL_CREAM | Freq: Every day | CUTANEOUS | Status: DC
  Administered 2018-07-29 – 2018-08-04 (×6): via TOPICAL
  Filled 2018-07-28 (×2): qty 85

## 2018-07-28 MED ORDER — GERHARDT'S BUTT CREAM
1.0000 "application " | TOPICAL_CREAM | Freq: Two times a day (BID) | CUTANEOUS | Status: DC
  Administered 2018-07-28 – 2018-08-04 (×14): 1 via TOPICAL
  Filled 2018-07-28 (×3): qty 1

## 2018-07-28 NOTE — Care Management (Addendum)
  07/28/2018 CM discussed the barriers with [facility] resuming services at discharged.  Pt is open to SNF- CSW will facilitate discharge planning   07/27/18 PTA pt active with [facility] Informed CM they will not accept pt back for home health as it is felt that pt is not appropriate for home health due to overall lack of support in the home.  Attending made aware

## 2018-07-28 NOTE — Progress Notes (Signed)
Patient c/o burning sensation to right knee lateral and posterior. Offered pt pain medicine and pt declined, states "it is not hurting, its just burning." Nurse educated pt that this is expected with her current situation. Nurse paged MD and notified of pt complain. MD agrees with nurse and nurse to re educate pt. Will continue to monitor

## 2018-07-28 NOTE — Progress Notes (Signed)
Placed patient on AVAPS for the night with VT set at 500, RR 10, 35% fio2. Max pressure set at 25.

## 2018-07-28 NOTE — Evaluation (Signed)
Occupational Therapy Evaluation Patient Details Name: Rachael Robertson MRN: [id number] DOB: 01-14-1951 Today's Date: 07/28/2018    History of Present Illness 68 year old female with a past medical history of chronic respiratory failure secondary to OHS/OSA requiring nocturnal ventilation via tracheostomy, chronic systolic heart failure with an EF of 45 to 50%, CKD stage III, history of PE on anticoagulation who presented to the internal medicine clinic with worsening right shin hematoma   Clinical Impression   Pt is assisted for ADL and IADL at baseline. Her mobility has been limited to transfers with RW to her w/c due to R LE pain. Pt has all necessary DME and AE and is knowledgeable in use from previous HHOT.  No acute needs. Pt with will have assistance of her sister x 2 weeks and niece PRN should she return home. Recommending SNF.    Follow Up Recommendations  SNF;Supervision/Assistance - 24 hour(pt likely to refuse)    Equipment Recommendations  3 in 1 bedside commode(bariatric)    Recommendations for Other Services       Precautions / Restrictions Precautions Precautions: Fall Precaution Comments: trach Restrictions Weight Bearing Restrictions: No      Mobility Bed Mobility Overal bed mobility: Needs Assistance Bed Mobility: Supine to Sit;Sit to Supine     Supine to sit: Min guard Sit to supine: Mod assist   General bed mobility comments: pt requires max inflation and trendelenburg to reposition in bed.   Transfers Overall transfer level: Needs assistance Equipment used: Rolling walker (2 wheeled)(bari) Transfers: Sit to/from Stand Sit to Stand: Min guard;+2 safety/equipment         General transfer comment: inreased time and effort, forward flexed posture unable to hold RW in correct positions as she leans over the walker. acomplished 3-4 side steps along edge of bed. VSS on T.C. 28%,     Balance Overall balance assessment: Needs assistance   Sitting  balance-Leahy Scale: Fair       Standing balance-Leahy Scale: Poor                             ADL either performed or assessed with clinical judgement   ADL Overall ADL's : Needs assistance/impaired Eating/Feeding: Independent;Bed level   Grooming: Set up;Sitting   Upper Body Bathing: Moderate assistance;Sitting   Lower Body Bathing: Total assistance;Sit to/from stand   Upper Body Dressing : Minimal assistance;Sitting   Lower Body Dressing: Total assistance;Sit to/from stand   Toilet Transfer: +2 for physical assistance;Min guard   Toileting- Clothing Manipulation and Hygiene: Total assistance;Sit to/from stand               Vision Baseline Vision/History: Wears glasses Wears Glasses: At all times Patient Visual Report: No change from baseline       Perception     Praxis      Pertinent Vitals/Pain Pain Assessment: Faces Pain Score: 5  Pain Location: R knee with standing  Pain Descriptors / Indicators: Aching Pain Intervention(s): Monitored during session;Repositioned     Hand Dominance Right   Extremity/Trunk Assessment Upper Extremity Assessment Upper Extremity Assessment: LUE deficits/detail LUE Deficits / Details: gout flare up with edema in L hand LUE: Unable to fully assess due to pain LUE Coordination: decreased fine motor   Lower Extremity Assessment Lower Extremity Assessment: Defer to PT evaluation   Cervical / Trunk Assessment Cervical / Trunk Assessment: Other exceptions(obesity)   Communication Communication Communication: No difficulties;Passy-Muir valve   Cognition Arousal/Alertness: Awake/alert Behavior  During Therapy: WFL for tasks assessed/performed Overall Cognitive Status: Within Functional Limits for tasks assessed                                 General Comments: pt excellent at directing her care, cannot multitask   General Comments       Exercises     Shoulder Instructions      Home  Living Family/patient expects to be discharged to:: Private residence Living Arrangements: Alone Available Help at Discharge: Family;Available PRN/intermittently;Personal care attendant Type of Home: House Home Access: Level entry     Home Layout: One level     Bathroom Shower/Tub: Producer, television/film/video: (uses [facility])     Home Equipment: Hospital bed;Electric scooter;Walker - 2 wheels;Bedside commode;Hand held shower head;Adaptive equipment(ventilator) Adaptive Equipment: Reacher;Sock aid;Long-handled sponge Additional Comments: has an aid in the a.m and p.m      Prior Functioning/Environment Level of Independence: Needs assistance  Gait / Transfers Assistance Needed: lmited to stand pivots right now, electric w/c for further mobility.  ADL's / Homemaking Assistance Needed: assisted for pericare, bathing, dressing and housekeeping/meals   Comments: 7x/week aide 1 hour AM 1 hour PM. family PRN         OT Problem List:        OT Treatment/Interventions:      OT Goals(Current goals can be found in the care plan section) Acute Rehab OT Goals Patient Stated Goal: return home   OT Frequency:     Barriers to D/C:            Co-evaluation PT/OT/SLP Co-Evaluation/Treatment: Yes Reason for Co-Treatment: For patient/therapist safety   OT goals addressed during session: ADL's and self-care      AM-PAC OT "6 Clicks" Daily Activity     Outcome Measure Help from another person eating meals?: None Help from another person taking care of personal grooming?: None Help from another person toileting, which includes using toliet, bedpan, or urinal?: Total Help from another person bathing (including washing, rinsing, drying)?: A Lot Help from another person to put on and taking off regular upper body clothing?: A Little Help from another person to put on and taking off regular lower body clothing?: Total 6 Click Score: 15   End of Session Equipment Utilized During  Treatment: Rolling walker;Oxygen(28% TC)  Activity Tolerance: Patient tolerated treatment well Patient left: in bed;with call bell/phone within reach;with nursing/sitter in room  OT Visit Diagnosis: Pain;Other abnormalities of gait and mobility (R26.89)                Time: 8341-9622 OT Time Calculation (min): 58 min Charges:  OT General Charges $OT Visit: 1 Visit OT Evaluation $OT Eval Moderate Complexity: 1 Mod OT Treatments $Self Care/Home Management : 8-22 mins  Martie Round, OTR/L Acute Rehabilitation Services Pager: [phone] Office: [phone]  Evern Bio 07/28/2018, 12:03 PM

## 2018-07-28 NOTE — Consult Note (Addendum)
Reason for Consult:Right Anterior Distal Lower extremity hematoma Referring Physician: Dr. Dwan Bolt is an 68 y.o. female.  HPI: Pleasant white female pt is currently inpatient in management of multiple co-morbidities.  We were consulted because of the Right Shin Hematoma.  The pt reports landing on her right shin when EMS was helping her transfer over a week ago.  She recalls noticing an ecchymosis after that incident.  She then reports hitting the same area on her couch a few days later.  The pt has discontinued her Xarelto.  Her hemoglobin has decreased since admission and today she is having 1 unit RBCs transfused. Pt is working with PT today in her room.   Past Medical History:  Diagnosis Date  . Obesity   . Opiate use   . Tracheostomy in place [facility])     Past Surgical History:  Procedure Laterality Date  . Perianal abscess    . TRACHELECTOMY      Family History  Problem Relation Age of Onset  . Aneurysm Mother   . Heart disease Father   . Skin cancer Father   . Sleep apnea Father        TRACHEOSTOMY AS WELL  . Breast cancer Sister     Social History:  reports that she is a non-smoker but has been exposed to tobacco smoke. She has never used smokeless tobacco. She reports current alcohol use. No history on file for drug.  Allergies:  Allergies  Allergen Reactions  . Other Anaphylaxis    Melons  . Fruit & Vegetable Daily [Nutritional Supplements] Diarrhea    Have to be cooked or vine-ripened  . Lactose Intolerance (Gi) Diarrhea  . Naproxen Other (See Comments)    Makes her "congested"  . Soy Allergy Diarrhea  . Sulfa Antibiotics Hives  . Erythromycin Hives and Rash  . Latex Rash  . Mupirocin Rash  . Tape Rash and Other (See Comments)    Plastic tape (alo) BURNS, but Tegaderm is tolerated    Medications: I have reviewed the patient's current medications.  Results for orders placed or performed during the hospital encounter of 07/26/18 (from the  past 48 hour(s))  Glucose, capillary     Status: Abnormal   Collection Time: 07/26/18 10:03 PM  Result Value Ref Range   Glucose-Capillary 111 (H) 70 - 99 mg/dL  Basic metabolic panel     Status: Abnormal   Collection Time: 07/27/18  2:55 AM  Result Value Ref Range   Sodium 138 135 - 145 mmol/L   Potassium 4.4 3.5 - 5.1 mmol/L   Chloride 95 (L) 98 - 111 mmol/L   CO2 35 (H) 22 - 32 mmol/L   Glucose, Bld 122 (H) 70 - 99 mg/dL   BUN 50 (H) 8 - 23 mg/dL   Creatinine, Ser 6.56 (H) 0.44 - 1.00 mg/dL   Calcium 8.2 (L) 8.9 - 10.3 mg/dL   GFR calc non Af Amer 39 (L) >60 mL/min   GFR calc Af Amer 45 (L) >60 mL/min   Anion gap 8 5 - 15    Comment: Performed at [facility], [address] [place], [place] [address]  CBC     Status: Abnormal   Collection Time: 07/27/18  2:55 AM  Result Value Ref Range   WBC 5.6 4.0 - 10.5 K/uL   RBC 2.64 (L) 3.87 - 5.11 MIL/uL   Hemoglobin 6.9 (LL) 12.0 - 15.0 g/dL    Comment: REPEATED TO VERIFY THIS CRITICAL  RESULT HAS VERIFIED AND BEEN CALLED TO E.CHEEK,RN BY MELISSA BROGDON ON 02 18 2020 AT 0329, AND HAS BEEN READ BACK.     HCT 24.4 (L) 36.0 - 46.0 %   MCV 92.4 80.0 - 100.0 fL   MCH 26.1 26.0 - 34.0 pg   MCHC 28.3 (L) 30.0 - 36.0 g/dL   RDW 78.6 (H) 75.4 - 49.2 %   Platelets 170 150 - 400 K/uL   nRBC 0.0 0.0 - 0.2 %    Comment: Performed at [facility], [address], [place], [place] [address]  Magnesium     Status: None   Collection Time: 07/27/18  2:55 AM  Result Value Ref Range   Magnesium 2.1 1.7 - 2.4 mg/dL    Comment: Performed at [facility], [address], [place], [place] [address]  Type and screen [facility]     Status: None   Collection Time: 07/27/18  5:46 AM  Result Value Ref Range   ABO/RH(D) O POS    Antibody Screen NEG    Sample Expiration 07/30/2018    Unit Number [id number]    Blood Component Type RED CELLS,LR    Unit division 00    Status of Unit      ISSUED,FINAL Performed at  [facility], [address] [place], [place] [address]    Transfusion Status OK TO TRANSFUSE    Crossmatch Result Compatible   Prepare RBC     Status: None   Collection Time: 07/27/18  5:46 AM  Result Value Ref Range   Order Confirmation ORDER PROCESSED BY BLOOD BANK   Urinalysis, Routine w reflex microscopic     Status: None   Collection Time: 07/27/18  9:25 AM  Result Value Ref Range   Color, Urine YELLOW YELLOW   APPearance CLEAR CLEAR   Specific Gravity, Urine 1.021 1.005 - 1.030   pH 5.0 5.0 - 8.0   Glucose, UA NEGATIVE NEGATIVE mg/dL   Hgb urine dipstick NEGATIVE NEGATIVE   Bilirubin Urine NEGATIVE NEGATIVE   Ketones, ur NEGATIVE NEGATIVE mg/dL   Protein, ur NEGATIVE NEGATIVE mg/dL   Nitrite NEGATIVE NEGATIVE   Leukocytes,Ua NEGATIVE NEGATIVE    Comment: Performed at [facility], [address] [place], [place] [address]  Hemoglobin and hematocrit, blood     Status: Abnormal   Collection Time: 07/27/18  4:41 PM  Result Value Ref Range   Hemoglobin 8.0 (L) 12.0 - 15.0 g/dL   HCT 08.8 (L) 11.0 - 31.5 %    Comment: Performed at [facility], [address] [place], [place] [address]  Basic metabolic panel     Status: Abnormal   Collection Time: 07/28/18  7:42 AM  Result Value Ref Range   Sodium 137 135 - 145 mmol/L   Potassium 4.7 3.5 - 5.1 mmol/L   Chloride 94 (L) 98 - 111 mmol/L   CO2 34 (H) 22 - 32 mmol/L   Glucose, Bld 86 70 - 99 mg/dL   BUN 39 (H) 8 - 23 mg/dL   Creatinine, Ser 9.29 (H) 0.44 - 1.00 mg/dL   Calcium 8.7 (L) 8.9 - 10.3 mg/dL   GFR calc non Af Amer 54 (L) >60 mL/min   GFR calc Af Amer >60 >60 mL/min   Anion gap 9 5 - 15    Comment: Performed at [facility], [address], [place], [place] [address]  CBC  Status: Abnormal   Collection Time: 07/28/18  7:42 AM  Result Value Ref Range   WBC 3.9 (L) 4.0 - 10.5 K/uL   RBC 2.98 (L) 3.87 - 5.11 MIL/uL   Hemoglobin 8.2 (L) 12.0 - 15.0 g/dL   HCT 85.0 (L) 27.7 - 41.2 %    MCV 92.3 80.0 - 100.0 fL   MCH 27.5 26.0 - 34.0 pg   MCHC 29.8 (L) 30.0 - 36.0 g/dL   RDW 87.8 (H) 67.6 - 72.0 %   Platelets 186 150 - 400 K/uL   nRBC 0.0 0.0 - 0.2 %    Comment: Performed at [facility], [address], [place], [place] [address]    No results found.  Review of Systems  Constitutional: Negative.   HENT: Negative.   Eyes: Negative.   Respiratory: Positive for shortness of breath.   Cardiovascular: Positive for leg swelling.  Genitourinary: Negative.   Musculoskeletal: Positive for back pain.  Skin: Negative.    Blood pressure (!) 112/53, pulse (!) 59, temperature (!) 97.5 F (36.4 C), temperature source Oral, resp. rate 16, SpO2 90 %. Physical Exam  Constitutional: She is oriented to person, place, and time. She appears well-developed.  Obese  HENT:  Head: Normocephalic and atraumatic.  Eyes: EOM are normal.  Respiratory: Effort normal. No respiratory distress.  GI: Soft. She exhibits mass.  Musculoskeletal:        General: Tenderness present.  Neurological: She is alert and oriented to person, place, and time.  Skin: Skin is warm and dry. There is erythema.  Psychiatric: She has a normal mood and affect. Her behavior is normal. Thought content normal.  Trach in place Large hematoma noted that encompasses from the knee to the ankle Wound measurement 10 cm x 8 cm x 0.1 cm Pictures taken for Epic  Assessment/Plan: I will consult with Dr. Ulice Bold to discuss our role in care of this pt.    Everlean Cherry, [facility] Plastic Surgery 07/28/2018, 10:13 AM   I have seen the patient and will plan to take her to the OR for debridement and evacuation of the hematoma.  Will place ACell and VAC if able.   Abdomen - foot ball size mass, firm in right quadrant. Notified primary team of finding and recommend Gen Surg consult / CT evaluation. Spoke with sister of patient.

## 2018-07-28 NOTE — Progress Notes (Signed)
Internal Medicine Attending:   I saw and examined the patient. I reviewed the resident's note and I agree with the resident's findings and plan as documented in the resident's note.  Patient states that she feels well today and has no new complaints.  She still has some persistent pain in her left hand.  She does note some oozing from her wound over her right shin.  Patient is initially admitted to the hospital with a worsening right shin hematoma.  Orthopedics evaluated the patient.  They evacuated approximately 500 cc of old blood from her right shin.  No indication for further orthopedic intervention at this time.  Wound care follow-up and recommendations appreciated.  Will follow plastic surgery recommendations.  PT/OT evaluation appreciated.  Patient will need placement in SNF on DC.  We will continue to hold Xarelto for now and recommend stopping this on discharge.  No further work-up at this time.  Of note, patient was being treated for UTI as an outpatient.  Repeat UA was negative.  No further work-up at this time.

## 2018-07-28 NOTE — Evaluation (Signed)
Physical Therapy Evaluation Patient Details Name: Rachael Robertson MRN: [id number] DOB: [birth year] Today's Date: 07/28/2018   History of Present Illness  68 year old female with a past medical history of chronic respiratory failure secondary to OHS/OSA requiring nocturnal ventilation via tracheostomy, chronic systolic heart failure with an EF of 45 to 50%, CKD stage III, history of PE on anticoagulation who presented to the internal medicine clinic with worsening right shin hematoma     Clinical Impression  Pt admitted with above diagnosis. Pt currently with functional limitations due to the deficits listed below (see PT Problem List). PTA, pt living at home with hx of recurrent falls, has aide 7x a week ~ 2 hours a day, family PRN. Pt stand pivot transferring and utilizing electric w/c for further mobility. States she feels she needs more support at this time, but wishes to return home over SNF. Today stood EOB and accomplished side steps with poor posture and balance. VSS on TC 28%. Pt will benefit from skilled PT to increase their independence and safety with mobility to allow discharge to the venue listed below.       Follow Up Recommendations SNF    Equipment Recommendations  None recommended by PT(all DME needs currently met at home )    Recommendations for Other Services       Precautions / Restrictions Precautions Precautions: Fall Restrictions Weight Bearing Restrictions: No      Mobility  Bed Mobility Overal bed mobility: Needs Assistance Bed Mobility: Supine to Sit;Sit to Supine     Supine to sit: Min guard Sit to supine: Mod assist   General bed mobility comments: pt requires max inflation and trendelenburg to reposition in bed.   Transfers Overall transfer level: Needs assistance Equipment used: Rolling walker (2 wheeled)(bari) Transfers: Sit to/from Stand Sit to Stand: Min guard         General transfer comment: inreased time and effort, forward flexed  posture unable to hold RW in correct positions as she leans over the walker. acomplished 3-4 side steps along edge of bed. VSS on T.C. 28%,   Ambulation/Gait             General Gait Details: deferred for pt safety  Stairs            Wheelchair Mobility    Modified Rankin (Stroke Patients Only)       Balance Overall balance assessment: Needs assistance   Sitting balance-Leahy Scale: Fair       Standing balance-Leahy Scale: Poor                               Pertinent Vitals/Pain Pain Assessment: 0-10 Pain Score: 5  Pain Location: R knee with standing  Pain Descriptors / Indicators: Aching Pain Intervention(s): Limited activity within patient's tolerance    Home Living Family/patient expects to be discharged to:: Private residence Living Arrangements: Alone Available Help at Discharge: Family;Available PRN/intermittently Type of Home: House Home Access: Level entry     Home Layout: One level Home Equipment: Hospital bed;Electric scooter;Walker - 2 wheels      Prior Function Level of Independence: Needs assistance   Gait / Transfers Assistance Needed: lmited to stand pivots right now, electric w/c for further mobility.   ADL's / Homemaking Assistance Needed: aides  Comments: 7x/week aide 1 hour AM 1 hour PM. family PRN      Hand Dominance        Extremity/Trunk Assessment  Upper Extremity Assessment Upper Extremity Assessment: Defer to OT evaluation    Lower Extremity Assessment Lower Extremity Assessment: Generalized weakness(R knee hematoma )       Communication      Cognition Arousal/Alertness: Awake/alert Behavior During Therapy: WFL for tasks assessed/performed Overall Cognitive Status: Within Functional Limits for tasks assessed                                        General Comments      Exercises     Assessment/Plan    PT Assessment Patient needs continued PT services  PT Problem List  Decreased strength;Decreased activity tolerance;Decreased mobility       PT Treatment Interventions DME instruction;Gait training;Functional mobility training;Therapeutic exercise;Therapeutic activities;Balance training    PT Goals (Current goals can be found in the Care Plan section)  Acute Rehab PT Goals Patient Stated Goal: return home  PT Goal Formulation: With patient Time For Goal Achievement: 08/11/18 Potential to Achieve Goals: Fair    Frequency Min 3X/week   Barriers to discharge Decreased caregiver support lives alone with aides ~2 hours a day    Co-evaluation               AM-PAC PT "6 Clicks" Mobility  Outcome Measure Help needed turning from your back to your side while in a flat bed without using bedrails?: None Help needed moving from lying on your back to sitting on the side of a flat bed without using bedrails?: A Little Help needed moving to and from a bed to a chair (including a wheelchair)?: A Lot Help needed standing up from a chair using your arms (e.g., wheelchair or bedside chair)?: A Lot Help needed to walk in hospital room?: Total Help needed climbing 3-5 steps with a railing? : Total 6 Click Score: 13    End of Session Equipment Utilized During Treatment: Oxygen Activity Tolerance: Patient tolerated treatment well Patient left: in bed;with call bell/phone within reach Nurse Communication: Mobility status PT Visit Diagnosis: Other abnormalities of gait and mobility (R26.89);Repeated falls (R29.6);Muscle weakness (generalized) (M62.81)    Time: 3794-3276 PT Time Calculation (min) (ACUTE ONLY): 60 min   Charges:   PT Evaluation $PT Eval Moderate Complexity: 1 Mod PT Treatments $Therapeutic Activity: 8-22 mins       Reinaldo Berber, PT, DPT Acute Rehabilitation Services Pager: [phone] Office: [phone]    Reinaldo Berber 07/28/2018, 10:16 AM

## 2018-07-28 NOTE — H&P (View-Only) (Signed)
Reason for Consult:Right Anterior Distal Lower extremity hematoma Referring Physician: Dr. Dwan Bolt is an 68 y.o. female.  HPI: Pleasant white female pt is currently inpatient in management of multiple co-morbidities.  We were consulted because of the Right Shin Hematoma.  The pt reports landing on her right shin when EMS was helping her transfer over a week ago.  She recalls noticing an ecchymosis after that incident.  She then reports hitting the same area on her couch a few days later.  The pt has discontinued her Xarelto.  Her hemoglobin has decreased since admission and today she is having 1 unit RBCs transfused. Pt is working with PT today in her room.   Past Medical History:  Diagnosis Date  . Obesity   . Opiate use   . Tracheostomy in place [facility])     Past Surgical History:  Procedure Laterality Date  . Perianal abscess    . TRACHELECTOMY      Family History  Problem Relation Age of Onset  . Aneurysm Mother   . Heart disease Father   . Skin cancer Father   . Sleep apnea Father        TRACHEOSTOMY AS WELL  . Breast cancer Sister     Social History:  reports that she is a non-smoker but has been exposed to tobacco smoke. She has never used smokeless tobacco. She reports current alcohol use. No history on file for drug.  Allergies:  Allergies  Allergen Reactions  . Other Anaphylaxis    Melons  . Fruit & Vegetable Daily [Nutritional Supplements] Diarrhea    Have to be cooked or vine-ripened  . Lactose Intolerance (Gi) Diarrhea  . Naproxen Other (See Comments)    Makes her "congested"  . Soy Allergy Diarrhea  . Sulfa Antibiotics Hives  . Erythromycin Hives and Rash  . Latex Rash  . Mupirocin Rash  . Tape Rash and Other (See Comments)    Plastic tape (alo) BURNS, but Tegaderm is tolerated    Medications: I have reviewed the patient's current medications.  Results for orders placed or performed during the hospital encounter of 07/26/18 (from the  past 48 hour(s))  Glucose, capillary     Status: Abnormal   Collection Time: 07/26/18 10:03 PM  Result Value Ref Range   Glucose-Capillary 111 (H) 70 - 99 mg/dL  Basic metabolic panel     Status: Abnormal   Collection Time: 07/27/18  2:55 AM  Result Value Ref Range   Sodium 138 135 - 145 mmol/L   Potassium 4.4 3.5 - 5.1 mmol/L   Chloride 95 (L) 98 - 111 mmol/L   CO2 35 (H) 22 - 32 mmol/L   Glucose, Bld 122 (H) 70 - 99 mg/dL   BUN 50 (H) 8 - 23 mg/dL   Creatinine, Ser 6.56 (H) 0.44 - 1.00 mg/dL   Calcium 8.2 (L) 8.9 - 10.3 mg/dL   GFR calc non Af Amer 39 (L) >60 mL/min   GFR calc Af Amer 45 (L) >60 mL/min   Anion gap 8 5 - 15    Comment: Performed at [facility], [address] [place], [place] [address]  CBC     Status: Abnormal   Collection Time: 07/27/18  2:55 AM  Result Value Ref Range   WBC 5.6 4.0 - 10.5 K/uL   RBC 2.64 (L) 3.87 - 5.11 MIL/uL   Hemoglobin 6.9 (LL) 12.0 - 15.0 g/dL    Comment: REPEATED TO VERIFY THIS CRITICAL  RESULT HAS VERIFIED AND BEEN CALLED TO E.CHEEK,RN BY MELISSA BROGDON ON 02 18 2020 AT 0329, AND HAS BEEN READ BACK.     HCT 24.4 (L) 36.0 - 46.0 %   MCV 92.4 80.0 - 100.0 fL   MCH 26.1 26.0 - 34.0 pg   MCHC 28.3 (L) 30.0 - 36.0 g/dL   RDW 78.6 (H) 75.4 - 49.2 %   Platelets 170 150 - 400 K/uL   nRBC 0.0 0.0 - 0.2 %    Comment: Performed at [facility], [address], [place], [place] [address]  Magnesium     Status: None   Collection Time: 07/27/18  2:55 AM  Result Value Ref Range   Magnesium 2.1 1.7 - 2.4 mg/dL    Comment: Performed at [facility], [address], [place], [place] [address]  Type and screen [facility]     Status: None   Collection Time: 07/27/18  5:46 AM  Result Value Ref Range   ABO/RH(D) O POS    Antibody Screen NEG    Sample Expiration 07/30/2018    Unit Number [id number]    Blood Component Type RED CELLS,LR    Unit division 00    Status of Unit      ISSUED,FINAL Performed at  [facility], [address] [place], [place] [address]    Transfusion Status OK TO TRANSFUSE    Crossmatch Result Compatible   Prepare RBC     Status: None   Collection Time: 07/27/18  5:46 AM  Result Value Ref Range   Order Confirmation ORDER PROCESSED BY BLOOD BANK   Urinalysis, Routine w reflex microscopic     Status: None   Collection Time: 07/27/18  9:25 AM  Result Value Ref Range   Color, Urine YELLOW YELLOW   APPearance CLEAR CLEAR   Specific Gravity, Urine 1.021 1.005 - 1.030   pH 5.0 5.0 - 8.0   Glucose, UA NEGATIVE NEGATIVE mg/dL   Hgb urine dipstick NEGATIVE NEGATIVE   Bilirubin Urine NEGATIVE NEGATIVE   Ketones, ur NEGATIVE NEGATIVE mg/dL   Protein, ur NEGATIVE NEGATIVE mg/dL   Nitrite NEGATIVE NEGATIVE   Leukocytes,Ua NEGATIVE NEGATIVE    Comment: Performed at [facility], [address] [place], [place] [address]  Hemoglobin and hematocrit, blood     Status: Abnormal   Collection Time: 07/27/18  4:41 PM  Result Value Ref Range   Hemoglobin 8.0 (L) 12.0 - 15.0 g/dL   HCT 08.8 (L) 11.0 - 31.5 %    Comment: Performed at [facility], [address] [place], [place] [address]  Basic metabolic panel     Status: Abnormal   Collection Time: 07/28/18  7:42 AM  Result Value Ref Range   Sodium 137 135 - 145 mmol/L   Potassium 4.7 3.5 - 5.1 mmol/L   Chloride 94 (L) 98 - 111 mmol/L   CO2 34 (H) 22 - 32 mmol/L   Glucose, Bld 86 70 - 99 mg/dL   BUN 39 (H) 8 - 23 mg/dL   Creatinine, Ser 9.29 (H) 0.44 - 1.00 mg/dL   Calcium 8.7 (L) 8.9 - 10.3 mg/dL   GFR calc non Af Amer 54 (L) >60 mL/min   GFR calc Af Amer >60 >60 mL/min   Anion gap 9 5 - 15    Comment: Performed at [facility], [address], [place], [place] [address]  CBC  Status: Abnormal   Collection Time: 07/28/18  7:42 AM  Result Value Ref Range   WBC 3.9 (L) 4.0 - 10.5 K/uL   RBC 2.98 (L) 3.87 - 5.11 MIL/uL   Hemoglobin 8.2 (L) 12.0 - 15.0 g/dL   HCT 65.5 (L) 37.4 - 82.7 %    MCV 92.3 80.0 - 100.0 fL   MCH 27.5 26.0 - 34.0 pg   MCHC 29.8 (L) 30.0 - 36.0 g/dL   RDW 07.8 (H) 67.5 - 44.9 %   Platelets 186 150 - 400 K/uL   nRBC 0.0 0.0 - 0.2 %    Comment: Performed at [facility], [address], [place], [place] [address]    No results found.  ROS Blood pressure (!) 112/53, pulse (!) 59, temperature (!) 97.5 F (36.4 C), temperature source Oral, resp. rate 16, SpO2 90 %. Physical Exam  Constitutional: She is oriented to person, place, and time.  Obese  GI: Soft.  Neurological: She is alert and oriented to person, place, and time.  Skin: Skin is warm and dry.  Psychiatric: She has a normal mood and affect. Her behavior is normal. Thought content normal.  Trach in place Large hematoma noted that encompasses from the knee to the ankle Wound measurement 10 cm x 8 cm x 0.1 cm Pictures taken for Epic   Assessment/Plan: I will consult with Dr. Ulice Bold to discuss our role in care of this pt.    Everlean Cherry, [facility] Plastic Surgery 07/28/2018, 10:13 AM   I have seen the patient and will plan to take her to the OR for debridement and evacuation of the hematoma.  Will place ACell and VAC if able.

## 2018-07-28 NOTE — Progress Notes (Signed)
Subjective: Rachael Robertson was doing well today, no acute events overnight. She reports that her leg is doing well, denies any new issues. She does report that she uses certain creams at home for her rashes and pain and she is requesting them, voltaren gel, hydrocortisone cream, and silver sulfadiazine. She reports that her left hand is about the same as when she came in. We discussed that home health was concerned about her ability to care for herself and that she may benefit from going to a SNF. She reported that she may consider this but that her niece is back in town now. Will discuss further after PT and OT recommendations in place.   Objective:  Vital signs in last 24 hours: Vitals:   07/28/18 0355 07/28/18 0435 07/28/18 0500 07/28/18 0600  BP:  (!) 103/45 (!) 118/59 104/73  Pulse:  (!) 51 (!) 49 (!) 58  Resp:  13 14 (!) 37  Temp: (!) 97.5 F (36.4 C)     TempSrc: Oral     SpO2:  100% 100% 99%    General: Obese female, resting comfortably, NAD Cardiac: RRR, no m/r/g Pulmonary: CTABL, no wheezing or rhonchi Abdomen: Obese abdomen, firm area over RLQ Extremity:Right shin: large hematoma over mid shin, wrapped in bandage, warm pulses with good DP pulses. Left 1st MCP joint, warm erythematous, edematous, tender.    Assessment/Plan:  Active Problems:   Chronic respiratory failure with hypoxia (HCC)   Tracheostomy status (HCC)   Subcutaneous hematoma  [name] with a history of chronic hypercapnea 2/2 OHS, OSA on chronic trach (on 2L supplemental O2 via trilogy ventilator), HFrEF (EF 45-50%), CKD stage 3, history of PE on Xarelto, and chronic skin bruising who presented from clinic for worsening rightshinhematoma and anemia. Found to have normal vital signs. Initial CBC showed Hgb 8.9, Hgb on discharge was 7.6.   Rightshinhematoma: Anemia: This morning patients hemoglobin was stable. Orthopedics drained out about 500 cc from the wound yesterday, now is  wrapped in bandage. We consulted plastic surgery for possible need to for skin breakdown or wound complications. She has not been taking her [place] Ramp and still has been having bleeding episodes. Given her multiple episode of bleeding we should stop her anticoagulation at this time. She had good pulses and her extremities were warm. -S/1 1 unit pRBC -Daily CBC -Transfusion goal >7 -Holding home Gibson Ramp, will stop this on discharge -Orthopedics consulted, appreciate recommendations > they were able to extrude about 500 cc out of wound, recommended WOC and to consider plastic surgery consult -Plastic surgery consulted, appreciate recommendations -WOC nurse consulted > calcium alginate, cover with ABD pads, secure with ACE -PT/OT recommended SNF placement -CSW reported that [facility] would not accept patient to [facility] as they felt she is not appropriate for [facility] due to lack of support at home. Can consider SNF placement, appreciate CSW assisting with this.   Chronic anticoagulation on Xeralto for PE: -Holding home Gibson Ramp -Will readdress stopping this Xeralto on discharge, she was not taking it for a few days prior to admission -Continue to monitor for clots and bleeding  Chronic hypercapnic respiratory failure: OSA,OHS: Chronic trach: -She has been compliant with ventilatorat home, she is on 4L O2 at night and 2L during the day. -Pulmonologyconsult for assistance with trach management, appreciate assistance -Continue albuterol PRN -Continue ventilator at night -Limit suctioning given history of hemoptysis  History of UTI, proteus mirabilis: -Repeat U/A was unremarkable  Intertrigo: -Continue topical nystatin -Fluconazole  today, will need weekly fluconazole dose, first dose was 2/11.  -2nd dose given on 2/18  Acute gout of left thumb: -She reports that pain is still present, is unchanged from admission.  -Add Voltaren gel -Continue colchicine -Hold allopurinol  Hypothyroidism: Continue  synthroid 50 mcg daily GERD: Conitnue home PPI RLS: Continue home pramipexole  FEN: No fluids, replete lytes prn, NPO VTE ppx: Hold due to bleeding Code Status: FULL    Dispo: Anticipated discharge is pending clinical improvement, possible d/c to SNF  [name], MD 07/28/2018, 6:21 AM Pager: [phone]

## 2018-07-29 ENCOUNTER — Inpatient Hospital Stay (HOSPITAL_COMMUNITY): Payer: Medicare Other | Admitting: Anesthesiology

## 2018-07-29 ENCOUNTER — Encounter (HOSPITAL_COMMUNITY): Payer: Self-pay | Admitting: Surgery

## 2018-07-29 ENCOUNTER — Encounter (HOSPITAL_COMMUNITY)
Admission: AD | Disposition: A | Discharge status: Skilled Nursing Facility | Payer: Self-pay | Source: Ambulatory Visit | Attending: Internal Medicine

## 2018-07-29 DIAGNOSIS — I493 Ventricular premature depolarization: Secondary | ICD-10-CM

## 2018-07-29 DIAGNOSIS — S8011XA Contusion of right lower leg, initial encounter: Secondary | ICD-10-CM

## 2018-07-29 HISTORY — PX: HEMATOMA EVACUATION: SHX5118

## 2018-07-29 LAB — BASIC METABOLIC PANEL
Anion gap: 13 (ref 5–15)
BUN: 35 mg/dL — ABNORMAL HIGH (ref 8–23)
CO2: 31 mmol/L (ref 22–32)
Calcium: 8.5 mg/dL — ABNORMAL LOW (ref 8.9–10.3)
Chloride: 92 mmol/L — ABNORMAL LOW (ref 98–111)
Creatinine, Ser: 1.05 mg/dL — ABNORMAL HIGH (ref 0.44–1.00)
GFR calc Af Amer: >60 mL/min (ref >60)
GFR calc non Af Amer: 56 mL/min — ABNORMAL LOW (ref >60)
Glucose, Bld: 96 mg/dL (ref 70–99)
Potassium: 4.8 mmol/L (ref 3.5–5.1)
Sodium: 136 mmol/L (ref 135–145)

## 2018-07-29 LAB — CBC
HEMATOCRIT: 27.8 % — AB (ref 36.0–46.0)
Hemoglobin: 8.1 g/dL — ABNORMAL LOW (ref 12.0–15.0)
MCH: 26.7 pg (ref 26.0–34.0)
MCHC: 29.1 g/dL — ABNORMAL LOW (ref 30.0–36.0)
MCV: 91.7 fL (ref 80.0–100.0)
Platelets: 181 10*3/uL (ref 150–400)
RBC: 3.03 MIL/uL — ABNORMAL LOW (ref 3.87–5.11)
RDW: 15.5 % (ref 11.5–15.5)
WBC: 5.3 10*3/uL (ref 4.0–10.5)
nRBC: 0 % (ref 0.0–0.2)

## 2018-07-29 SURGERY — EVACUATION HEMATOMA
Anesthesia: General | Site: Leg Lower | Laterality: Right

## 2018-07-29 MED ORDER — MORPHINE SULFATE (PF) 2 MG/ML IV SOLN
INTRAVENOUS | Status: AC
  Administered 2018-07-29: 2 mg via INTRAVENOUS
  Filled 2018-07-29: qty 1

## 2018-07-29 MED ORDER — MIDAZOLAM HCL 5 MG/5ML IJ SOLN
INTRAMUSCULAR | Status: DC | PRN
  Administered 2018-07-29: 2 mg via INTRAVENOUS

## 2018-07-29 MED ORDER — 0.9 % SODIUM CHLORIDE (POUR BTL) OPTIME
TOPICAL | Status: DC | PRN
  Administered 2018-07-29: 1000 mL

## 2018-07-29 MED ORDER — DEXMEDETOMIDINE HCL IN NACL 200 MCG/50ML IV SOLN
INTRAVENOUS | Status: DC | PRN
  Administered 2018-07-29: 4 ug via INTRAVENOUS
  Administered 2018-07-29 (×2): 8 ug via INTRAVENOUS

## 2018-07-29 MED ORDER — FENTANYL CITRATE (PF) 250 MCG/5ML IJ SOLN
INTRAMUSCULAR | Status: DC | PRN
  Administered 2018-07-29 (×2): 50 ug via INTRAVENOUS

## 2018-07-29 MED ORDER — FENTANYL CITRATE (PF) 250 MCG/5ML IJ SOLN
INTRAMUSCULAR | Status: AC
  Filled 2018-07-29: qty 5

## 2018-07-29 MED ORDER — CLINDAMYCIN PHOSPHATE 900 MG/50ML IV SOLN
INTRAVENOUS | Status: AC
  Filled 2018-07-29: qty 50

## 2018-07-29 MED ORDER — DEXMEDETOMIDINE HCL IN NACL 200 MCG/50ML IV SOLN
INTRAVENOUS | Status: AC
  Filled 2018-07-29: qty 50

## 2018-07-29 MED ORDER — CHLORHEXIDINE GLUCONATE CLOTH 2 % EX PADS
6.0000 | MEDICATED_PAD | Freq: Once | CUTANEOUS | Status: AC
  Administered 2018-07-29: 6 via TOPICAL

## 2018-07-29 MED ORDER — ONDANSETRON HCL 4 MG/2ML IJ SOLN
INTRAMUSCULAR | Status: AC
  Filled 2018-07-29: qty 2

## 2018-07-29 MED ORDER — LACTATED RINGERS IV SOLN
INTRAVENOUS | Status: DC | PRN
  Administered 2018-07-29: 14:00:00 via INTRAVENOUS

## 2018-07-29 MED ORDER — CLINDAMYCIN PHOSPHATE 900 MG/50ML IV SOLN
900.0000 mg | INTRAVENOUS | Status: AC
  Administered 2018-07-29: 900 mg via INTRAVENOUS

## 2018-07-29 MED ORDER — HEPARIN (PORCINE) IN NACL 2-0.9 UNITS/ML
INTRAMUSCULAR | Status: AC | PRN
  Administered 2018-07-29: 500 mL

## 2018-07-29 MED ORDER — CHLORHEXIDINE GLUCONATE CLOTH 2 % EX PADS: 6.0000 | MEDICATED_PAD | Freq: Once | CUTANEOUS | Status: DC

## 2018-07-29 MED ORDER — THROMBIN 5000 UNITS EX SOLR
CUTANEOUS | Status: DC | PRN
  Administered 2018-07-29: 5000 [IU] via TOPICAL

## 2018-07-29 MED ORDER — MIDAZOLAM HCL 2 MG/2ML IJ SOLN
INTRAMUSCULAR | Status: AC
  Filled 2018-07-29: qty 2

## 2018-07-29 MED ORDER — THROMBIN 5000 UNITS EX SOLR
CUTANEOUS | Status: AC
  Filled 2018-07-29: qty 5000

## 2018-07-29 MED ORDER — SODIUM CHLORIDE 0.9 % IV SOLN
INTRAVENOUS | Status: AC
  Filled 2018-07-29: qty 1.2

## 2018-07-29 MED ORDER — MORPHINE SULFATE (PF) 2 MG/ML IV SOLN
2.0000 mg | Freq: Once | INTRAVENOUS | Status: AC
  Administered 2018-07-29: 2 mg via INTRAVENOUS

## 2018-07-29 MED ORDER — EPHEDRINE SULFATE-NACL 50-0.9 MG/10ML-% IV SOSY
PREFILLED_SYRINGE | INTRAVENOUS | Status: DC | PRN
  Administered 2018-07-29: 15 mg via INTRAVENOUS
  Administered 2018-07-29: 10 mg via INTRAVENOUS

## 2018-07-29 MED ORDER — ONDANSETRON HCL 4 MG/2ML IJ SOLN
INTRAMUSCULAR | Status: DC | PRN
  Administered 2018-07-29: 4 mg via INTRAVENOUS

## 2018-07-29 MED ORDER — DEXTROSE 5 % IV SOLN
3.0000 g | INTRAVENOUS | Status: AC
  Administered 2018-07-29: 3 g via INTRAVENOUS
  Filled 2018-07-29: qty 3

## 2018-07-29 MED ORDER — DEXAMETHASONE SODIUM PHOSPHATE 10 MG/ML IJ SOLN
INTRAMUSCULAR | Status: AC
  Filled 2018-07-29: qty 1

## 2018-07-29 MED ORDER — MORPHINE SULFATE (PF) 2 MG/ML IV SOLN
INTRAVENOUS | Status: AC
  Filled 2018-07-29: qty 1

## 2018-07-29 MED ORDER — THROMBIN (RECOMBINANT) 5000 UNITS EX SOLR
CUTANEOUS | Status: AC
  Filled 2018-07-29: qty 5000

## 2018-07-29 MED ORDER — CEFAZOLIN SODIUM-DEXTROSE 2-4 GM/100ML-% IV SOLN
INTRAVENOUS | Status: AC
  Filled 2018-07-29: qty 100

## 2018-07-29 MED ORDER — DEXAMETHASONE SODIUM PHOSPHATE 10 MG/ML IJ SOLN
INTRAMUSCULAR | Status: DC | PRN
  Administered 2018-07-29: 4 mg via INTRAVENOUS

## 2018-07-29 SURGICAL SUPPLY — 42 items
BLADE 10 SAFETY STRL DISP (BLADE) ×3 IMPLANT
BNDG COHESIVE 4X5 TAN STRL (GAUZE/BANDAGES/DRESSINGS) ×2 IMPLANT
BNDG ELASTIC 6X10 VLCR STRL LF (GAUZE/BANDAGES/DRESSINGS) ×4 IMPLANT
BNDG GAUZE ELAST 4 BULKY (GAUZE/BANDAGES/DRESSINGS) ×4 IMPLANT
CANISTER SUCT 3000ML PPV (MISCELLANEOUS) IMPLANT
COVER SURGICAL LIGHT HANDLE (MISCELLANEOUS) ×3 IMPLANT
COVER WAND RF STERILE (DRAPES) ×1 IMPLANT
DRAPE ORTHO SPLIT 77X108 STRL (DRAPES) ×4
DRAPE SURG ORHT 6 SPLT 77X108 (DRAPES) IMPLANT
DRESSING HYDROCOLLOID 4X4 (GAUZE/BANDAGES/DRESSINGS) ×2 IMPLANT
DRSG CUTIMED SORBACT 7X9 (GAUZE/BANDAGES/DRESSINGS) ×2 IMPLANT
DRSG VAC ATS MED SENSATRAC (GAUZE/BANDAGES/DRESSINGS) ×2 IMPLANT
ELECT NDL TIP 2.8 STRL (NEEDLE) IMPLANT
ELECT NEEDLE TIP 2.8 STRL (NEEDLE) IMPLANT
ELECT REM PT RETURN 9FT ADLT (ELECTROSURGICAL) ×3
ELECTRODE REM PT RTRN 9FT ADLT (ELECTROSURGICAL) ×1 IMPLANT
GAUZE SPONGE 4X4 12PLY STRL (GAUZE/BANDAGES/DRESSINGS) IMPLANT
GLOVE BIO SURGEON STRL SZ 6.5 (GLOVE) ×4 IMPLANT
GLOVE BIO SURGEONS STRL SZ 6.5 (GLOVE) ×2
GLOVE SURG SS PI 7.5 STRL IVOR (GLOVE) ×2 IMPLANT
GOWN STRL REUS W/ TWL LRG LVL3 (GOWN DISPOSABLE) ×2 IMPLANT
GOWN STRL REUS W/TWL LRG LVL3 (GOWN DISPOSABLE) ×4
HEMOSTAT ARISTA ABSORB 3G PWDR (HEMOSTASIS) ×4 IMPLANT
KIT BASIN OR (CUSTOM PROCEDURE TRAY) ×3 IMPLANT
KIT TURNOVER KIT B (KITS) ×3 IMPLANT
MATRIX WOUND 3-LAYER 7X10 (Tissue) ×1 IMPLANT
MICROMATRIX 1000MG (Tissue) ×3 IMPLANT
NS IRRIG 1000ML POUR BTL (IV SOLUTION) ×5 IMPLANT
PACK GENERAL/GYN (CUSTOM PROCEDURE TRAY) ×3 IMPLANT
PAD ABD 8X10 STRL (GAUZE/BANDAGES/DRESSINGS) ×2 IMPLANT
PAD ARMBOARD 7.5X6 YLW CONV (MISCELLANEOUS) ×6 IMPLANT
SOLUTION PARTIC MCRMTRX 1000MG (Tissue) IMPLANT
SPONGE LAP 18X18 RF (DISPOSABLE) ×2 IMPLANT
STOCKINETTE IMPERVIOUS LG (DRAPES) ×2 IMPLANT
SUT MON AB 3-0 SH 27 (SUTURE) ×2
SUT MON AB 3-0 SH27 (SUTURE) IMPLANT
SUT MON AB 4-0 PC3 18 (SUTURE) ×2 IMPLANT
SUT VIC AB 5-0 PS2 18 (SUTURE) ×4 IMPLANT
TOWEL OR 17X24 6PK STRL BLUE (TOWEL DISPOSABLE) ×3 IMPLANT
TOWEL OR 17X26 10 PK STRL BLUE (TOWEL DISPOSABLE) ×3 IMPLANT
WND VAC CANISTER 500ML (MISCELLANEOUS) ×2 IMPLANT
WOUND MATRIX 3-LAYER 7X10 (Tissue) ×1 IMPLANT

## 2018-07-29 NOTE — Interval H&P Note (Signed)
History and Physical Interval Note:  07/29/2018 1:37 PM  Rachael Robertson  has presented today for surgery, with the diagnosis of hematoma of right leg  The various methods of treatment have been discussed with the patient and family. After consideration of risks, benefits and other options for treatment, the patient has consented to  Procedure(s): EVACUATION HEMATOMA OF RIGHT LEG WITH POSSIBLE ACELL AND VAC PLACEMENT (Right) as a surgical intervention .  The patient's history has been reviewed, patient examined, no change in status, stable for surgery.  I have reviewed the patient's chart and labs.  Questions were answered to the patient's satisfaction.     Alena Bills Dillingham

## 2018-07-29 NOTE — Clinical Social Work Note (Signed)
Clinical Social Work Assessment  Patient Details  Name: Rachael Robertson MRN: [id number] Date of Birth: March 27, 1951  Date of referral:  07/29/18               Reason for consult:  Facility Placement                Permission sought to share information with:  Family Supports(niece and sister) Permission granted to share information::  Yes, Verbal Permission Granted  Name::     Skilled Nursing Careers adviser::  Skilled Nursing Facilities   Relationship::  Skilled Nursing Facilities   Contact Information:  Skilled Nursing Facilties   Housing/Transportation Living arrangements for the past 2 months:  Single Family Home(in [place]) Source of Information:  Patient Patient Interpreter Needed:  None Criminal Activity/Legal Involvement Pertinent to Current Situation/Hospitalization:  No - Comment as needed Significant Relationships:  Other Family Members Lives with:  Self Do you feel safe going back to the place where you live?  Yes Need for family participation in patient care:  Yes (Comment)  Care giving concerns:  CSW consulted as pt may be appropriate for SNF placement at the time of discharge.    Social Worker assessment / plan:  CSW spoke with pt at bedside. CSW was informed that pt is originally from [place] and moved to [place] in November 2019. Pt reports that she moved here to be with niece and to get care. CSW was advised that per pt, pt has two care gives that come into her home twice a day. Pt reports that in the time between when they come, pt's niece has been the one coming. Pt reports that niece had gone out of town and pt was behind on getting medications but niece is now back in town along with her mother (pt's sister). Pt report that she is needing to utilize them while they are here and is skeptical about going to a SNF. Pt reports that she knows she may need rehab but wants to get her hoe in order first and only has two weeks to do so.    Pt expressed that she is  would like for CSW to follow back up in the morning after her leg surgery to see what pt's decision is regarding placement at that time. CSW agreeable.   Employment status:  Retired Health and safety inspector:  Medicare PT Recommendations:  Skilled Nursing Facility Information / Referral to community resources:  Skilled Nursing Facility  Patient/Family's Response to care:  Pt appeared to be understanding of care needs, but not sure what to do. Pt reports that she now wants to et her own together with her niece and sister but also knows that she is struggling to care for herself.   Patient/Family's Understanding of and Emotional Response to Diagnosis, Current Treatment, and Prognosis:  At this time no further questions or concerns have been presented to CSW. CSW will continue to follow for further needs.   Emotional Assessment Appearance:    Attitude/Demeanor/Rapport:  Gracious, Engaged Affect (typically observed):  Accepting, Adaptable, Appropriate, Pleasant Orientation:  Oriented to Self, Oriented to Place, Oriented to  Time, Oriented to Situation Alcohol / Substance use:  Not Applicable Psych involvement (Current and /or in the community):  No (Comment)  Discharge Needs  Concerns to be addressed:  Care Coordination Readmission within the last 30 days:  No Current discharge risk:  Dependent with Mobility, Lives alone, Lack of support system Barriers to Discharge:  Continued Medical Work up  Robb Matar, LCSWA 07/29/2018, 9:01 AM

## 2018-07-29 NOTE — Op Note (Addendum)
DATE OF OPERATION: 07/29/2018  LOCATION: [facility] Room Inpatient  PREOPERATIVE DIAGNOSIS: right leg hematoma  POSTOPERATIVE DIAGNOSIS: Same  PROCEDURE: Evacuation of right leg hematoma, excision of 7 x 10 cm necrotic skin, application of vac  SURGEON: Claire [employer], DO  ASSISTANT: Carmen Mayo, PA  EBL: 100 cc  CONDITION: Stable  COMPLICATIONS: None  INDICATION: The patient, Rachael Robertson, is a 68 y.o. female born on 11-23-1950, is here for treatment of a right leg hematoma.   PROCEDURE DETAILS:  The patient was seen prior to surgery and marked.  The IV antibiotics were given. The patient was taken to the operating room and given a general anesthetic. A standard time out was performed and all information was confirmed by those in the room. SCD was placed.   The right leg was prepped and draped.  The hematoma was evacuated.  The area was irrigated with saline and thrombin irrigant.  The 7 x 10 cm area of necrotic tissue was excised with the #10 blade.  Hemostasis was achieved with electrocautery and thrombin.  All of the acell powder and sheet was applied and secured with the 5-0 Vicryl.   The medium vac was applied and secured with an excellent seal.  The leg was wrapped with kerlex and an ace wrap.  The patient was allowed to wake up and taken to recovery room in stable condition at the end of the case. The family was notified at the end of the case.   The advanced practice practitioner (APP) assisted throughout the case.  The APP was essential in retraction and counter traction when needed to make the case progress smoothly.  This retraction and assistance made it possible to see the tissue plans for the procedure.  The assistance was needed for blood control, tissue re-approximation and assisted with closure of the incision site.

## 2018-07-29 NOTE — Progress Notes (Signed)
Internal Medicine Attending:   I saw and examined the patient. I reviewed the resident's note and I agree with the resident's findings and plan as documented in the resident's note.  Patient feels well today with no new complaints.  She still has some persistent left hand pain which she attributes to a gout flare.  Patient is initially made to the hospital with a right shin hematoma secondary to trauma.  She initially had approximately 500 cc of old blood evacuated through the cut on her leg from orthopedics.  She was evaluated by plastic surgery who took the patient to the OR today for evacuation of her right leg hematoma and excision of 7 x 10 cm necrotic skin and application of wound VAC.  Patient hemoglobin has remained stable status post 1 unit PRBC.  Patient was on Xarelto at home and this medication was held on hospitalization and will be stopped on discharge.  PT/OT recommending SNF placement.  No further work-up at this time.  Patient will likely be stable for DC to SNF over the weekend if bed available.

## 2018-07-29 NOTE — Progress Notes (Signed)
Placed patient on AVAPS for the night with VT at 500ml, Max pressure at 25cm, Fio2 35%, EPAP at 5cm.           

## 2018-07-29 NOTE — Progress Notes (Signed)
eLink Physician-Brief Progress Note Patient Name: Rachael Robertson DOB: 28-Jan-1951 MRN: [id number]   Date of Service  07/29/2018  HPI/Events of Note  RN asking for fentanyl low dose, since can not give oral tramadol for pain. Camera: Resting comfortably. VS stable.   eICU Interventions  Will avoid IV narcotics for now. AM team to evaluate  Pain.      Intervention Category Intermediate Interventions: Pain - evaluation and management  Ranee Gosselin 07/29/2018, 7:13 AM

## 2018-07-29 NOTE — Anesthesia Procedure Notes (Signed)
Procedure Name: Intubation Date/Time: 07/29/2018 2:07 PM Performed by: Adria Dill, CRNA Pre-anesthesia Checklist: Patient identified, Emergency Drugs available, Suction available and Patient being monitored Patient Re-evaluated:Patient Re-evaluated prior to induction Oxygen Delivery Method: Circle system utilized Preoxygenation: Pre-oxygenation with 100% oxygen Induction Type: IV induction Tube type: Reinforced Tube size: 5.5 mm Number of attempts: 2 Placement Confirmation: positive ETCO2 and breath sounds checked- equal and bilateral Tube secured with: Tape Dental Injury: Teeth and Oropharynx as per pre-operative assessment  Comments: ETT passed through stoma

## 2018-07-29 NOTE — Anesthesia Postprocedure Evaluation (Signed)
Anesthesia Post Note  Patient: [name]  Procedure(s) Performed: EVACUATION HEMATOMA OF RIGHT LEG WITH  ACELL AND VAC PLACEMENT (Right Leg Lower)     Patient location during evaluation: PACU Anesthesia Type: General Level of consciousness: awake and alert Pain management: pain level controlled Vital Signs Assessment: post-procedure vital signs reviewed and stable Respiratory status: spontaneous breathing, nonlabored ventilation, respiratory function stable and patient connected to nasal cannula oxygen Cardiovascular status: blood pressure returned to baseline and stable Postop Assessment: no apparent nausea or vomiting Anesthetic complications: no    Last Vitals:  Vitals:   07/29/18 1555 07/29/18 1615  BP:  (!) 99/49  Pulse: 65 65  Resp: 16 16  Temp: (!) 36.3 C   SpO2: 99% 96%    Last Pain:  Vitals:   07/29/18 1615  TempSrc:   PainSc: 4                  Rachael Robertson DANIEL

## 2018-07-29 NOTE — Transfer of Care (Signed)
Immediate Anesthesia Transfer of Care Note  Patient: [facility]  Procedure(s) Performed: EVACUATION HEMATOMA OF RIGHT LEG WITH  ACELL AND VAC PLACEMENT (Right Leg Lower)  Patient Location: PACU  Anesthesia Type:General  Level of Consciousness: awake, oriented and patient cooperative  Airway & Oxygen Therapy: Patient Spontanous Breathing and Patient connected to tracheostomy mask oxygen  Post-op Assessment: Report given to RN and Post -op Vital signs reviewed and stable  Post vital signs: Reviewed and stable  Last Vitals:  Vitals Value Taken Time  BP 128/50 07/29/2018  3:05 PM  Temp    Pulse 72 07/29/2018  3:07 PM  Resp 17 07/29/2018  3:07 PM  SpO2 93 % 07/29/2018  3:07 PM  Vitals shown include unvalidated device data.  Last Pain:  Vitals:   07/29/18 1200  TempSrc:   PainSc: 0-No pain      Patients Stated Pain Goal: 0 (07/29/18 0755)  Complications: No apparent anesthesia complications

## 2018-07-29 NOTE — Progress Notes (Signed)
Alginate dressing has adhered to knee wound and left in place this am per surgery.

## 2018-07-29 NOTE — Progress Notes (Signed)
Subjective: Rachael Robertson was doing well today, no acute events overnight. She reports that she is still having some left hand pain, she reports that her right leg pain is better. She denies any new issues today. We discussed that she will be going in for surgery for her hematoma today and reports that she spoke with the plastic surgeon about that. We discussed discharge options for her and that PT and OT recommended SNF placement, she stated that she will be talking with the CSW later today to discuss options. She reported that her family is now back in town and that they had made plans to do stuff, however that she knows that this may not be an option. She understands that she will need more help then what can be provided at home.   Objective:  Vital signs in last 24 hours: Vitals:   07/29/18 0330 07/29/18 0400 07/29/18 0500 07/29/18 0600  BP:      Pulse:  (!) 43 61 61  Resp:  12 18 14   Temp: 97.7 F (36.5 C)     TempSrc: Oral     SpO2:  99% 98% 99%    General: Obese female, NAD, resting comfortably Cardiac: Irregular rhythm, no m/r/g Pulmonary: CTABL, no wheezing or rhonchi Abdomen: Obese, firm area over RLQ Extremity:Right shin: Large hematoma with central opening, dark blood oozing out Left 1st MCP joint: Warm, tender, no erythema, minimal edema    Assessment/Plan:  Active Problems:   Chronic respiratory failure with hypoxia (HCC)   Tracheostomy status (HCC)   Subcutaneous hematoma  [name] with a history of chronic hypercapnea 2/2 OHS, OSA on chronic trach (on 2L supplemental O2 via trilogy ventilator), HFrEF (EF 45-50%), CKD stage 3, history of PE on Xarelto, and chronic skin bruising who presented from clinic for worsening rightshinhematoma and anemia. Found to have normal vital signs. Initial CBC showed Hgb 8.9, Hgb on discharge was 7.6.   Rightshinhematoma: Anemia: This morning patients hemoglobin was 8.1, stable from yesterday. Orthopedics  drained out about 500 cc from the wound , no further workup from their standpoint. Plastic surgery came to evaluate and plan to take patient to OR for evacuation of the hematoma. Given her multiple episode of bleeding we should stop her anticoagulation at this time. On exam today hematoma is unchanged, still enlarged and oozing out dark blood. -S/1 1 unit pRBC, hgb has been stable -Daily CBC -Transfusion goal >7 -Holding home Gibson Ramp, will stop this on discharge -Orthopedics consulted, appreciate recommendations > they were able to extrude about 500 cc out of wound, recommended WOC and to consider plastic surgery consult -Plastic surgery consulted > plan to take patient to OR for hematoma evacuation -WOC nurse consulted > calcium alginate, cover with ABD pads, secure with ACE -PT/OT recommended SNF placement -CSW reported that [facility] would not accept patient to [name] as they felt she is not appropriate for [name] due to lack of support at home. Can consider SNF placement, appreciate CSW assisting with this.   Chronic anticoagulation on Xeralto for PE: -Holding home Gibson Ramp -Will stop [place] on discharge, she was not taking it for a few days prior to admission and was still having bleeding episodes -Continue to monitor for clots and bleeding   Frequent PVCs: -Noted on telemetry, she has a history of frequent PVCs. She is on carvedilol at home for blood pressure. This may help with her frequent PVCs.  -Can consider starting metoprolol tartrate 12.5 BID tomorrow  pending her blood pressure  Chronic hypercapnic respiratory failure: OSA,OHS: Chronic trach: -No acute events overnight, she has been on BiPAP overnight. She has been compliant with ventilatorat home, she is on 4L O2 at night and 2L during the day. -Pulmonology following, appreciate assistance -Continue albuterol PRN -Continue BiPAP on trach at night -Limit suctioning given history of hemoptysis  Intertrigo: -Continue topical  nystatin -Fluconazole today, will need weekly fluconazole dose, first dose was 2/11.  -2nd dose given on 2/18  Acute gout of left thumb: -She reports that the pain and swelling is about the same. The redness has improved.  -Continue Voltaren gel -Continue colchicine 0.6 mg daily -Hold allopurinol  Hypothyroidism: Continue synthroid 50 mcg daily GERD: Conitnue home PPI RLS: Continue home pramipexole  FEN: No fluids, replete lytes prn, NPO, can restart carb mod diet after procedure VTE ppx: SCDs Code Status: FULL    Dispo: Anticipated discharge is pending clinical improvement, possible d/c to SNF  [name], MD 07/29/2018, 6:47 AM Pager: [phone]

## 2018-07-29 NOTE — Care Management (Signed)
  07/29/2018  CM met with pt, sister and niece bedside to discuss discharge planning.  All are in agreement for discharge to SNF.  CM contacted [facility] regarding concerns with home oxygen equipment    07/28/18 CM discussed the barriers with [facility] resuming services at discharged.  Pt is open to SNF- CSW will facilitate discharge planning   07/27/18 PTA pt active with [facility] Informed CM they will not accept pt back for home health as it is felt that pt is not appropriate for home health due to overall lack of support in the home.  Attending made aware

## 2018-07-29 NOTE — NC FL2 (Signed)
[place] MEDICAID FL2 LEVEL OF CARE SCREENING TOOL     IDENTIFICATION  Patient Name: Rachael Robertson Birthdate: 1950/06/19 Sex: female Admission Date (Current Location): 07/26/2018  [facility] and [place] Number:  Producer, television/film/video and Address:  [facility], [address] [place], [place] [address]      Provider Number: [id number]  Attending Physician Name and Address:  Earl Lagos, MD  Relative Name and Phone Number:       Current Level of Care: Hospital Recommended Level of Care: Skilled Nursing Facility Prior Approval Number:    Date Approved/Denied:   PASRR Number:   [id number]   Discharge Plan: SNF    Current Diagnoses: Patient Active Problem List   Diagnosis Date Noted  . Subcutaneous hematoma 07/26/2018  . Acute blood loss anemia 07/26/2018  . Hematuria 07/26/2018  . Tracheostomy complication (HCC)   . Right Lower Lobe PNU  05/28/2018  . Chronic systolic heart failure (HCC) 05/20/2018  . Arthritis 05/20/2018  . Other chronic pain 05/20/2018  . Lethargy   . Hemoptysis   . OSA (obstructive sleep apnea)   . Tracheostomy status (HCC)   . Hypoxemia   . Chronic respiratory failure with hypoxia (HCC) 05/07/2018    Orientation RESPIRATION BLADDER Height & Weight     Self, Time, Situation, Place  Tracheostomy(on BiPap currently. 35 percent. ) Incontinent Weight:   Height:     BEHAVIORAL SYMPTOMS/MOOD NEUROLOGICAL BOWEL NUTRITION STATUS      Incontinent (please see discharge  summary. )  AMBULATORY STATUS COMMUNICATION OF NEEDS Skin     Verbally Bruising(right knee)                       Personal Care Assistance Level of Assistance              Functional Limitations Info  Sight, Hearing, Speech Sight Info: Adequate Hearing Info: Adequate Speech Info: Adequate    SPECIAL CARE FACTORS FREQUENCY  OT (By licensed OT), PT (By licensed PT), Speech therapy     PT Frequency: 5 times a week OT Frequency: 5 times  a week     Speech Therapy Frequency: 5 times a week       Contractures Contractures Info: Not present    Additional Factors Info  Code Status, Allergies Code Status Info: FULL Allergies Info: Other, Fruit & Vegetable Daily Nutritional Supplements, Lactose Intolerance (Gi), Naproxen, Soy Allergy, Sulfa Antibiotics, Erythromycin, Latex, Mupirocin, Tape           Current Medications (07/29/2018):  This is the current hospital active medication list Current Facility-Administered Medications  Medication Dose Route Frequency Provider Last Rate Last Dose  . 0.9 %  sodium chloride infusion (Manually program via Guardrails IV Fluids)   Intravenous Once Karl Ito, MD      . bumetanide Janalyn Harder) tablet 2 mg  2 mg Oral Daily Angelita Ingles, MD   2 mg at 07/28/18 1527  . colchicine tablet 0.6 mg  0.6 mg Oral Daily Eulah Pont, MD   0.6 mg at 07/28/18 0900  . diclofenac sodium (VOLTAREN) 1 % transdermal gel 2 g  2 g Topical QID Angelita Ingles, MD   2 g at 07/28/18 2242  . ferrous sulfate tablet 325 mg  325 mg Oral Q breakfast Eulah Pont, MD   325 mg at 07/28/18 0900  . fluconazole (DIFLUCAN) tablet 150 mg  150 mg Oral Weekly [name], MD   150  mg at 07/27/18 1610  . Gerhardt's butt cream 1 application  1 application Topical BID Angelita Ingles, MD   1 application at 07/28/18 2200  . ipratropium-albuterol (DUONEB) 0.5-2.5 (3) MG/3ML nebulizer solution 3 mL  3 mL Nebulization Q4H PRN Earl Lagos, MD   3 mL at 07/28/18 0325  . levothyroxine (SYNTHROID, LEVOTHROID) tablet 50 mcg  50 mcg Oral Q0600 Eulah Pont, MD   50 mcg at 07/28/18 [phone]  . mometasone-formoterol (DULERA) 200-5 MCG/ACT inhaler 2 puff  2 puff Inhalation BID Eulah Pont, MD   2 puff at 07/28/18 1939  . pantoprazole (PROTONIX) EC tablet 40 mg  40 mg Oral Daily Eulah Pont, MD   40 mg at 07/28/18 0900  . pramipexole (MIRAPEX) tablet 1 mg  1 mg Oral BID Rehman, Areeg N, DO   1 mg at 07/28/18 2242  . silver  sulfADIAZINE (SILVADENE) 1 % cream   Topical Daily Angelita Ingles, MD      . traMADol Janean Sark) tablet 50 mg  50 mg Oral Q6H PRN Eulah Pont, MD   50 mg at 07/28/18 9604     Discharge Medications: Please see discharge summary for a list of discharge medications.  Relevant Imaging Results:  Relevant Lab Results:   Additional Information [id number]. pt had trach placed on 07/19/2018.   Robb Matar, LCSWA

## 2018-07-29 NOTE — Anesthesia Preprocedure Evaluation (Addendum)
Anesthesia Evaluation  Patient identified by MRN, date of birth, ID band Patient awake    Reviewed: Allergy & Precautions, NPO status , Patient's Chart, lab work & pertinent test results  History of Anesthesia Complications Negative for: history of anesthetic complications  Airway Mallampati: Trach       Dental   Pulmonary asthma , sleep apnea and Continuous Positive Airway Pressure Ventilation ,    Pulmonary exam normal        Cardiovascular hypertension, Pt. on medications +CHF  Normal cardiovascular exam  Impressions:  - LVEF 45-50%, mild LVH, global hypokinesis, grade 1 DD, elevated LV filling pressure, normal LA size, mild LAE, dilated IVC.     Neuro/Psych negative neurological ROS  negative psych ROS   GI/Hepatic negative GI ROS, Neg liver ROS,   Endo/Other  Morbid obesity  Renal/GU negative Renal ROS     Musculoskeletal negative musculoskeletal ROS (+)   Abdominal   Peds  Hematology negative hematology ROS (+)   Anesthesia Other Findings Day of surgery medications reviewed with the patient.  Reproductive/Obstetrics                            Anesthesia Physical Anesthesia Plan  ASA: IV  Anesthesia Plan: General   Post-op Pain Management:    Induction: Intravenous  PONV Risk Score and Plan: 3 and Ondansetron, Dexamethasone and Diphenhydramine  Airway Management Planned: Oral ETT  Additional Equipment:   Intra-op Plan:   Post-operative Plan:   Informed Consent: I have reviewed the patients History and Physical, chart, labs and discussed the procedure including the risks, benefits and alternatives for the proposed anesthesia with the patient or authorized representative who has indicated his/her understanding and acceptance.       Plan Discussed with: CRNA and Anesthesiologist  Anesthesia Plan Comments:        Anesthesia Quick Evaluation

## 2018-07-30 ENCOUNTER — Inpatient Hospital Stay (HOSPITAL_COMMUNITY): Payer: Medicare Other

## 2018-07-30 ENCOUNTER — Encounter (HOSPITAL_COMMUNITY): Payer: Self-pay | Admitting: Plastic Surgery

## 2018-07-30 DIAGNOSIS — R1903 Right lower quadrant abdominal swelling, mass and lump: Secondary | ICD-10-CM

## 2018-07-30 LAB — BASIC METABOLIC PANEL
Anion gap: 10 (ref 5–15)
BUN: 40 mg/dL — ABNORMAL HIGH (ref 8–23)
CO2: 32 mmol/L (ref 22–32)
Calcium: 8.3 mg/dL — ABNORMAL LOW (ref 8.9–10.3)
Chloride: 93 mmol/L — ABNORMAL LOW (ref 98–111)
Creatinine, Ser: 1.31 mg/dL — ABNORMAL HIGH (ref 0.44–1.00)
GFR calc Af Amer: 49 mL/min — ABNORMAL LOW (ref >60)
GFR calc non Af Amer: 43 mL/min — ABNORMAL LOW (ref >60)
GLUCOSE: 145 mg/dL — AB (ref 70–99)
Potassium: 5.5 mmol/L — ABNORMAL HIGH (ref 3.5–5.1)
Sodium: 135 mmol/L (ref 135–145)

## 2018-07-30 LAB — CBC
HCT: 27.9 % — ABNORMAL LOW (ref 36.0–46.0)
Hemoglobin: 8.1 g/dL — ABNORMAL LOW (ref 12.0–15.0)
MCH: 26.6 pg (ref 26.0–34.0)
MCHC: 29 g/dL — ABNORMAL LOW (ref 30.0–36.0)
MCV: 91.5 fL (ref 80.0–100.0)
Platelets: 179 10*3/uL (ref 150–400)
RBC: 3.05 MIL/uL — ABNORMAL LOW (ref 3.87–5.11)
RDW: 15.4 % (ref 11.5–15.5)
WBC: 7.3 10*3/uL (ref 4.0–10.5)
nRBC: 0 % (ref 0.0–0.2)

## 2018-07-30 LAB — T4, FREE: Free T4: 0.93 ng/dL (ref 0.82–1.77)

## 2018-07-30 MED ORDER — CHLORHEXIDINE GLUCONATE 0.12% ORAL RINSE (MEDLINE KIT)
15.0000 mL | Freq: Two times a day (BID) | OROMUCOSAL | Status: DC
  Administered 2018-07-30 – 2018-08-04 (×8): 15 mL via OROMUCOSAL

## 2018-07-30 MED ORDER — IOHEXOL 300 MG/ML  SOLN
80.0000 mL | Freq: Once | INTRAMUSCULAR | Status: AC | PRN
  Administered 2018-07-30: 80 mL via INTRAVENOUS

## 2018-07-30 MED ORDER — SODIUM CHLORIDE 0.9 % IV BOLUS
500.0000 mL | Freq: Once | INTRAVENOUS | Status: AC
  Administered 2018-07-30: 500 mL via INTRAVENOUS

## 2018-07-30 MED ORDER — ORAL CARE MOUTH RINSE
15.0000 mL | OROMUCOSAL | Status: DC
  Administered 2018-07-30 – 2018-07-31 (×5): 15 mL via OROMUCOSAL

## 2018-07-30 NOTE — Care Management (Addendum)
  07/30/2018  CM acknowledges consult for DME (wound vac) at discharge - CSW informed of the wound vac need for pt at discharge  07/29/18  CM met with pt, sister and niece bedside to discuss discharge planning.  All are in agreement for discharge to SNF.  CM contacted [facility] regarding concerns with home oxygen equipment    07/28/18 CM discussed the barriers with [facility] resuming services at discharged.  Pt is open to SNF- CSW will facilitate discharge planning   07/27/18 PTA pt active with [facility] Informed CM they will not accept pt back for home health as it is felt that pt is not appropriate for home health due to overall lack of support in the home.  Attending made aware

## 2018-07-30 NOTE — Progress Notes (Signed)
Subjective: Ms. Valensuela was doing well today, no acute events overnight.  She reports that she is having some right lower extremity stinging, around the area of her surgery.  She denies any other issues today.  For her right lower quadrant abdominal firmness she reports that this started about 1 year ago and states that it was a Lovenox reaction, she reports that she initially had it on both her right and her left however the left side improved.  She reports that she did have a CT scan of it about 1 year ago and she was just told that it was fluid.  We discussed that we will repeat an imaging today to evaluate it.  Objective:  Vital signs in last 24 hours: Vitals:   07/30/18 0005 07/30/18 0319 07/30/18 0324 07/30/18 0400  BP: 134/62   106/64  Pulse: 80  70 75  Resp: 18 17 15 17   Temp:      TempSrc:      SpO2: 100%  99% 95%    General: Obese female, no acute distress, trach in place Cardiac: RRR, no murmurs rubs or gallops Pulmonary: Coarse breath sounds bilaterally, normal work of breathing Abdomen: Obese, RLQ firm and swollen area measuring about 6x6 cm, no erythema or edema,  Extremity:Right shin: wrapped in bandage, pulses intact, warm extremities, and sensation intact Left 1st MCP joint: Decreased swelling, tender to palpation   Assessment/Plan:  Active Problems:   Chronic respiratory failure with hypoxia (HCC)   Tracheostomy status (HCC)   Subcutaneous hematoma  [name] with a history of chronic hypercapnea 2/2 OHS, OSA on chronic trach (on 2L supplemental O2 via trilogy ventilator), HFrEF (EF 45-50%), CKD stage 3, history of PE on Xarelto, and chronic skin bruising who presented from clinic for worsening rightshinhematoma and anemia. Found to have normal vital signs. Initial CBC showed Hgb 8.9, Hgb on discharge was 7.6.   Rightshinhematoma: Anemia: This morning patients hemoglobin was stable at 8.1.  Dr. Ulice Bold from plastic surgery evaudated  the hematoma in the OR yesterday. Given her multiple episode of bleeding we should stop her anticoagulation at this time. On exam today her leg was wrapped in bandage, she had good pulses, sensation was intact and her extremities were warm.  -S/1 1 unit pRBC, hgb has been stable -Daily CBC -Transfusion goal >7 -Holding home Gibson Ramp, will stop this on discharge -Plastic surgery consulted > took patient for evacuation of hematoma in OR yesterday -PT/OT recommended SNF placement -CSW reported that [facility] would not accept patient to [facility] as they felt she is not appropriate for [facility] due to lack of support at home. Can consider SNF placement, appreciate CSW assisting with this.   Chronic anticoagulation on Xeralto for PE: -Holding home Gibson Ramp -Will stop [place] on discharge, she was not taking it for a few days prior to admission and was still having bleeding episodes -Continue to monitor for clots and bleeding   Right lower quadrant mass: -She reports that she has had this for about a year now, she states that she was told it was a reaction to the Lovenox, she had it on both sides of her abdomen and her left side improved.  She states that she had a CT scan of it last year and told that it was fluid.  It measures about 6 x 6 cm in size, firm, but not solid, and swollen.  Is possibility that this may be a mass, however could also be a  fluid collection.  We will obtain imaging to evaluate. -CT abdomen pelvis with contrast -Fluids prior to CT scan  Frequent PVCs: -Noted on telemetry, she has a history of frequent PVCs. She is on carvedilol at home for blood pressure. -Can consider starting metoprolol tartrate 12.5 BID tomorrow depending on her blood pressure  Chronic hypercapnic respiratory failure: OSA,OHS: Chronic trach: -No acute events overnight.  -Pulmonology following, appreciate assistance -Continue albuterol PRN -Continue BiPAP on trach at night -Limit suctioning given history of  hemoptysis  Intertrigo: -Continue topical nystatin -Fluconazole today, will need weekly fluconazole dose, first dose was 2/11.  -2nd dose given on 2/18  Acute gout of left thumb: -Improving, continue colchicine 0.6 mg daily -Continue Voltaren gel -Hold allopurinol  FEN: No fluids, replete lytes prn, NPO, carb modified diet VTE ppx: SCDs Code Status: FULL    Dispo: Anticipated discharge is pending clinical improvement, possible d/c to SNF  [name], MD 07/30/2018, 6:30 AM Pager: [phone]

## 2018-07-30 NOTE — Progress Notes (Signed)
Internal Medicine Attending:   I saw and examined the patient. I reviewed the resident's note and I agree with the resident's findings and plan as documented in the resident's note.  Patient feels well today but complains of some mild stinging sensation in her right lower extremity.  She denies any other complaints at this time.  Patient was initially admitted to the hospital with a right shin hematoma.  She is status post hematoma evacuation by plastic surgery yesterday with excision of a 7 x 10 cm area of necrotic skin.  Plastic surgery follow-up and recommendations appreciated.  Patient's hemoglobin has remained stable.  We will continue to monitor this closely.  Right lower extremity dressing intact.  Will DC Xarelto as patient has recurrent episodes of bleeding including shin hematoma.  PT/OT recommending SNF placement.   Of note, patient complains of a mass in her right lower quadrant which she states has been there for the last 2 years and occurred after she is getting therapeutic Lovenox for PEs.  On exam, patient has a well-defined right lower quadrant mass with no tenderness or erythema or increased local warmth.  Will follow-up CT abdomen pelvis with contrast.  We will give the patient IV fluids prior to her CT scan and hold her Bumex today.  No further work-up at this time.

## 2018-07-30 NOTE — Progress Notes (Signed)
PT Cancellation Note  Patient Details Name: Makaiah Vanauken MRN: [id number] DOB: 25-Oct-1950   Cancelled Treatment:    Reason Eval/Treat Not Completed: Patient at procedure or test/unavailable attempted at 1140, pt at CT will re-attempt as schedule permits.    Etta Grandchild, PT, DPT Acute Rehabilitation Services Pager: [phone] Office: [phone]     Etta Grandchild 07/30/2018, 11:42 AM

## 2018-07-30 NOTE — Progress Notes (Addendum)
Subjective: Very pleasant 68 year old white female pt day 1 status post evacuation of right lower extremity hematoma.  Pt reports doing well this am.  She reports she had no pain over night but over the last hour she has noted some stinging in her wound.  She said it does not require pain control.  The pt is also concerned about a large mass in her right abdominal quadrant.  She report in 2009 she was diagnosed with diffuse pulmonary emboli.  She was getting Lovenox shots in her abdomen at the time and developed hematomas on both sides of her abdomen.  She said the left side went away over time.  She says the mass gets bigger and harder at times when she is edematous but never goes away.  She reports getting a CT over a year ago.   I am encouraged to note that her Hemoglobin was unchanged from yesterday at 8.1.  Objective: Vital signs in last 24 hours: Temp:  [97.4 F (36.3 C)-98.3 F (36.8 C)] 98 F (36.7 C) (02/21 0700) Pulse Rate:  [59-80] 75 (02/21 0400) Resp:  [13-23] 17 (02/21 0400) BP: (98-134)/(44-64) 106/64 (02/21 0400) SpO2:  [92 %-100 %] 95 % (02/21 0400) FiO2 (%):  [28 %] 28 % (02/21 0324) Weight change:  Last BM Date: 07/29/18  Intake/Output from previous day: 02/20 0701 - 02/21 0700 In: 1150 [P.O.:800; I.V.:300] Out: 1250 [Urine:1150; Blood:100] Intake/Output this shift: No intake/output data recorded.  Pt is alert and oriented Obese Large mass is palpated in the right lower quadrant of abd, Not TTP abd soft Wound vac, Kerlix and ace bandage in place on RLE Vac is functioning well   Lab Results: Recent Labs    07/29/18 0659 07/30/18 0346  WBC 5.3 7.3  HGB 8.1* 8.1*  HCT 27.8* 27.9*  PLT 181 179   BMET Recent Labs    07/29/18 0659 07/30/18 0346  NA 136 135  K 4.8 5.5*  CL 92* 93*  CO2 31 32  GLUCOSE 96 145*  BUN 35* 40*  CREATININE 1.05* 1.31*  CALCIUM 8.5* 8.3*    Studies/Results: No results found.  Medications: I have reviewed the patient's  current medications.  Assessment/Plan:  Elicited and answered questions about the procedure yesterday Pt is unsure if she will be going to SNF or discharging home I will consult with Dr Ulice Bold about the RLQ mass  LOS: 4 days    Everlean Cherry, [facility] Plastic Surgery [phone] 07/30/2018

## 2018-07-31 DIAGNOSIS — I5020 Unspecified systolic (congestive) heart failure: Secondary | ICD-10-CM

## 2018-07-31 DIAGNOSIS — D649 Anemia, unspecified: Secondary | ICD-10-CM

## 2018-07-31 DIAGNOSIS — L304 Erythema intertrigo: Secondary | ICD-10-CM

## 2018-07-31 DIAGNOSIS — Z978 Presence of other specified devices: Secondary | ICD-10-CM

## 2018-07-31 LAB — BASIC METABOLIC PANEL
Anion gap: 9 (ref 5–15)
BUN: 36 mg/dL — ABNORMAL HIGH (ref 8–23)
CO2: 33 mmol/L — ABNORMAL HIGH (ref 22–32)
Calcium: 8.3 mg/dL — ABNORMAL LOW (ref 8.9–10.3)
Chloride: 95 mmol/L — ABNORMAL LOW (ref 98–111)
Creatinine, Ser: 1.18 mg/dL — ABNORMAL HIGH (ref 0.44–1.00)
GFR calc Af Amer: 56 mL/min — ABNORMAL LOW (ref >60)
GFR calc non Af Amer: 48 mL/min — ABNORMAL LOW (ref >60)
Glucose, Bld: 110 mg/dL — ABNORMAL HIGH (ref 70–99)
Potassium: 5 mmol/L (ref 3.5–5.1)
Sodium: 137 mmol/L (ref 135–145)

## 2018-07-31 LAB — CBC
HCT: 26.2 % — ABNORMAL LOW (ref 36.0–46.0)
Hemoglobin: 7.5 g/dL — ABNORMAL LOW (ref 12.0–15.0)
MCH: 26.3 pg (ref 26.0–34.0)
MCHC: 28.6 g/dL — ABNORMAL LOW (ref 30.0–36.0)
MCV: 91.9 fL (ref 80.0–100.0)
Platelets: 176 10*3/uL (ref 150–400)
RBC: 2.85 MIL/uL — ABNORMAL LOW (ref 3.87–5.11)
RDW: 15.7 % — ABNORMAL HIGH (ref 11.5–15.5)
WBC: 7 10*3/uL (ref 4.0–10.5)
nRBC: 0 % (ref 0.0–0.2)

## 2018-07-31 MED ORDER — METOPROLOL TARTRATE 12.5 MG HALF TABLET
12.5000 mg | ORAL_TABLET | Freq: Two times a day (BID) | ORAL | Status: DC
  Administered 2018-07-31: 12.5 mg via ORAL
  Filled 2018-07-31 (×2): qty 1

## 2018-07-31 NOTE — Progress Notes (Signed)
Internal Medicine Attending  Date: 07/31/2018  Patient name: Rachael Robertson Medical record number: [id number] Date of birth: 1950/11/05 Age: 68 y.o. Gender: female  I saw and evaluated the patient. I reviewed the resident's note by Dr. Gwyneth Revels and I agree with the resident's findings and plans as documented in her progress note.  [name] has been thinking about her next steps, especially given the connection to the wound VAC.  Although she wishes to return home she does admit that a skilled nursing facility is probably an inescapable best choice at this point.  We are continuing current supportive care and awaiting skilled nursing facility placement for further rehabilitation while her wounds heal.  General surgery plans on evaluating the soft tissue mass as an outpatient given its chronicity.

## 2018-07-31 NOTE — Progress Notes (Signed)
Subjective: Ms. Tatom was doing well today, no acute events overnight. She reports that her leg was having some pain but that it has improved. She states that she is not having any pain near the abdominal mass but that it interferes greatly with her life, she states that she has a hard time sitting up completely and difficulty moving around because of it. She states taht she would like it removed but understands that there are risks with every procedure. She reported that since she now has the wound vac she will likely need to go to a SNF since it is another thing that will be hooked up to her. We discussed the results of the CT scan and that we will consult general surgery about it today.   Objective:  Vital signs in last 24 hours: Vitals:   07/31/18 0316 07/31/18 0400 07/31/18 0500 07/31/18 0600  BP:  102/70    Pulse:  (!) 56 (!) 54 (!) 49  Resp:  16 20 19   Temp:  98.6 F (37 C)    TempSrc:  Oral    SpO2: 100% 100% 100% 100%    General: Obese appearing female, NAD, trach in place Cardiac: RRR, no m/r/g Pulmonary: Coarse breath sounds bilaterally, normal work of breathing Abdomen: Obese, RLQ mass, no edema or erythema over that area Extremity: Right LE: shin wrapped in bandage, wound vac in place, pulses intact, warm extremities and sensation intact.    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 Initial CBC showed Hgb 8.9, Hgb on discharge was 7.6.  Rightshinhematoma: Anemia: -Improving, s/p evacuation by Plastic Surgeon Dr. Ulice Bold. Patient  reports that she had some pain in the area today, on exam her extremities were warm, sensation intact, and pulses 2+. Currently has a wound vac on and right lower extremity in bandage. Hemoglobin has been stable, today it is 7.5. Given her multiple episode of bleeding we should stop her anticoagulation on discharge.  -S/1 1 unit pRBC on 2/18 -Daily CBC -Transfusion goal >7 -Holding home Gibson Ramp, will stop this on discharge -Plastic surgery consulted > took patient for evacuation of hematoma on 2/20 -PT/OT recommended SNF placement -CSW reported that [facility] would not accept patient to [facility] as they felt she is not appropriate for [facility] due to lack of support at home. Can consider SNF placement, appreciate CSW assisting with this.  Chronic anticoagulation on Xeralto for PE: -Holding home Gibson Ramp -Will stop [place] discharge, she was not taking it for a few days prior to admission and was still having bleeding episodes -Continue to monitor for clots and bleeding   Right lower quadrant mass: -She reports that she has had this for about a year now, she states that she was told it was a reaction to the Lovenox, she had it on both sides of her abdomen and her left side improved.  She states that she had a CT scan of it last year and told that it was fluid.   -CT abdomen pelvis with contrast showed a partially visualized complex fluid collection measuring at least 18 cm in diameter,  and a smaller hyperdense fluid collection measuring 4.6 cm. Spoke with plastic surgery who recommended general surgery consult -Contacted surgery who reported that since this is not an urgent matter she can follow-up with them outpatient to assess this mass. Placed information in her discharge paperwork.   Frequent PVCs: -Noted on telemetry, she has a history of frequent PVCs. She is on carvedilol at home that she only takes if her blood pressure allows. Given the frequency of her PVCs there is a concern for development of a  cardiomyopathy.  -Start metoprolol tartrate 12.5 BID  Chronic hypercapnic respiratory failure: OSA,OHS: Chronic trach: -No acute events overnight.  -Pulmonology following, appreciate assistance -Continue albuterol PRN -ContinueBiPAP on trachat night -Limit suctioning given history of hemoptysis  Intertrigo: -Continue topical nystatin -Fluconazole today, will need weekly fluconazole dose, first dose was 2/11.  -2nd dose given on 2/18  Acute gout of left thumb: -Continue colchicine 0.6 mg daily -Continue Voltaren gel -Hold allopurinol  FEN:No fluids, replete lytes prn, NPO, carb modified diet VTE JGG:EZMO Code Status: FULL   Dispo: Anticipated discharge is pending SNF placement  [name], MD 07/31/2018, 6:19 AM Pager: [phone]

## 2018-07-31 NOTE — Progress Notes (Signed)
Patient awaiting further care from RN before going on the V60 AVAPS.

## 2018-08-01 LAB — BASIC METABOLIC PANEL
Anion gap: 12 (ref 5–15)
BUN: 37 mg/dL — ABNORMAL HIGH (ref 8–23)
CO2: 28 mmol/L (ref 22–32)
Calcium: 8 mg/dL — ABNORMAL LOW (ref 8.9–10.3)
Chloride: 98 mmol/L (ref 98–111)
Creatinine, Ser: 1.33 mg/dL — ABNORMAL HIGH (ref 0.44–1.00)
GFR calc Af Amer: 48 mL/min — ABNORMAL LOW (ref >60)
GFR calc non Af Amer: 42 mL/min — ABNORMAL LOW (ref >60)
Glucose, Bld: 87 mg/dL (ref 70–99)
Potassium: 4.7 mmol/L (ref 3.5–5.1)
Sodium: 138 mmol/L (ref 135–145)

## 2018-08-01 LAB — CBC
HCT: 26.1 % — ABNORMAL LOW (ref 36.0–46.0)
Hemoglobin: 7.5 g/dL — ABNORMAL LOW (ref 12.0–15.0)
MCH: 27 pg (ref 26.0–34.0)
MCHC: 28.7 g/dL — ABNORMAL LOW (ref 30.0–36.0)
MCV: 93.9 fL (ref 80.0–100.0)
Platelets: 177 10*3/uL (ref 150–400)
RBC: 2.78 MIL/uL — ABNORMAL LOW (ref 3.87–5.11)
RDW: 16 % — ABNORMAL HIGH (ref 11.5–15.5)
WBC: 5.5 10*3/uL (ref 4.0–10.5)
nRBC: 0 % (ref 0.0–0.2)

## 2018-08-01 MED ORDER — LOPERAMIDE HCL 2 MG PO CAPS
4.0000 mg | ORAL_CAPSULE | Freq: Three times a day (TID) | ORAL | Status: DC | PRN
  Administered 2018-08-01 – 2018-08-02 (×2): 4 mg via ORAL
  Filled 2018-08-01 (×4): qty 2

## 2018-08-01 NOTE — Progress Notes (Signed)
Subjective: Pt reports doing well today, denies SOB, says wound site pain has improved significantly.   Objective:  Vital signs in last 24 hours: Vitals:   08/01/18 0100 08/01/18 0200 08/01/18 0300 08/01/18 0400  BP:    (!) 99/49  Pulse: 60 (!) 49 (!) 55 (!) 112  Resp: 16 15 18  (!) 22  Temp:      TempSrc:      SpO2: 100% 100% 98% 100%    General: Obese appearing female, NAD, trach in place Cardiac: Sinus bradycardia with frequent ectopy, No MRG Pulmonary: Distant breath sounds, normal work of breathing Abdomen: Obese, RLQ mass, no edema or erythema over that area Extremity: Right LE: shin wrapped in bandage, wound vac in place, pulses intact, warm extremities and sensation intact.  No pitting edema bilaterally   Assessment/Plan:  Active Problems:   Chronic respiratory failure with hypoxia (HCC)   Tracheostomy status (HCC)   Subcutaneous hematoma   [name] with a history of chronic hypercapnea 2/2 OHS, OSA on chronic trach (on 2L supplemental O2 via trilogy ventilator), HFrEF (EF 45-50%), CKD stage 3, history of PE on Xarelto, and chronic skin bruising who presented from clinic for worsening rightshinhematoma and anemia.Found to have normal vital signs. Initial CBC showed Hgb 8.9, Hgb on discharge was 7.6.  Rightshinhematoma: Anemia:Improving, s/p evacuation by Plastic Surgeon Dr. Ulice Bold. Patient reports that she had some pain in the area today, on exam her extremities were warm, sensation intact, and pulses 2+. Currently has a wound vac on and right lower extremity in bandage. Hemoglobin has been stable,  Given her multiple episode of bleeding we will stop her anticoagulation on discharge. S/p 1 unit pRBC on 2/18  -Daily CBC -Transfusion goal >7 -Holding home Gibson Ramp, will stop this on discharge -PT/OT recommended SNF placement -CSW reported that [facility] would not accept patient to [facility] as they felt she is not appropriate for [facility] due to lack of  support at home. Can consider SNF placement, appreciate CSW assisting with this.  Chronic anticoagulation on Xarelto for PE: -Holding home [place] Ramp -Will stop [employer] discharge, she was not taking it for a few days prior to admission and was still having bleeding episodes -Continue to monitor for clots and bleeding   Right lower quadrant mass: -She reports that she has had this for about a year now, she states that she was told it was a reaction to the Lovenox, she had it on both sides of her abdomen and her left side improved.  She states that she had a CT scan of it last year and told that it was fluid.   -CT abdomen pelvis with contrast showed a partially visualized complex fluid collection measuring at least 18 cm in diameter, and a smaller hyperdense fluid collection measuring 4.6 cm. Spoke with plastic surgery who recommended general surgery consult -Contacted surgery who reported that since this is not an urgent matter she can follow-up with them outpatient to assess this mass. Placed information in her discharge paperwork.   Frequent PVCs: -Noted on telemetry, she has a history of frequent PVCs. She is on carvedilol at home that she only takes if her blood pressure allows. Given the frequency of her PVCs there is a concern for development of a cardiomyopathy.  -d/c metoprolol for now as bp/hr did not tolerate.  She may need ablation as an outpatient  Chronic hypercapnic respiratory failure: OSA,OHS: Chronic trach: -No acute events overnight.  -Pulmonology following, appreciate assistance -  Continue albuterol PRN -ContinueBiPAP on trachat night -Limit suctioning given history of hemoptysis  Intertrigo: -Continue topical nystatin -Fluconazole today, will need weekly fluconazole dose, first dose was 2/11.  -2nd dose given on 2/18  Acute gout of left thumb: -Continue colchicine 0.6 mg daily -Continue Voltaren gel -Hold allopurinol  FEN:No fluids, replete lytes prn,  NPO, carb modified diet VTE OIB:BCWU Code Status: FULL   Dispo: Anticipated discharge is pending SNF placement  Angelita Ingles, MD 08/01/2018, 7:16 AM

## 2018-08-01 NOTE — Progress Notes (Addendum)
CSW met with patient at bedside. Patient is still reluctant to go to a skilled nursing facility. She has a one level home with all of her own equipment and would like to go home if it was possible. She has a current wound vac and stated that she is not able to handle that on her own.   It was discussed if she had any family that would be able to assist her. She stated she has her sister and her niece. She does have her niece and sister help her in shifts. CSW asked if either one could help her, she stated that it was very unlikely for them to help for long periods of time.   Patient was trying to get family on the phone. CSW provided current bed offers. Patient will have family go look at the skilled nursing facilities.   CSW will continue to follow.  Domenic Schwab, MSW, [place]

## 2018-08-02 LAB — BASIC METABOLIC PANEL
Anion gap: 8 (ref 5–15)
BUN: 32 mg/dL — AB (ref 8–23)
CO2: 31 mmol/L (ref 22–32)
CREATININE: 1.13 mg/dL — AB (ref 0.44–1.00)
Calcium: 8.1 mg/dL — ABNORMAL LOW (ref 8.9–10.3)
Chloride: 99 mmol/L (ref 98–111)
GFR calc Af Amer: 59 mL/min — ABNORMAL LOW (ref >60)
GFR calc non Af Amer: 51 mL/min — ABNORMAL LOW (ref >60)
Glucose, Bld: 79 mg/dL (ref 70–99)
Potassium: 4.6 mmol/L (ref 3.5–5.1)
Sodium: 138 mmol/L (ref 135–145)

## 2018-08-02 LAB — CBC
HCT: 27.1 % — ABNORMAL LOW (ref 36.0–46.0)
Hemoglobin: 8 g/dL — ABNORMAL LOW (ref 12.0–15.0)
MCH: 27.4 pg (ref 26.0–34.0)
MCHC: 29.5 g/dL — ABNORMAL LOW (ref 30.0–36.0)
MCV: 92.8 fL (ref 80.0–100.0)
Platelets: 190 10*3/uL (ref 150–400)
RBC: 2.92 MIL/uL — ABNORMAL LOW (ref 3.87–5.11)
RDW: 15.8 % — ABNORMAL HIGH (ref 11.5–15.5)
WBC: 4.8 10*3/uL (ref 4.0–10.5)
nRBC: 0 % (ref 0.0–0.2)

## 2018-08-02 NOTE — Progress Notes (Addendum)
11:28am- CSW spoke with MD and was informed that she is planning to discharge pt today. CSW advised that at this time pt is still reluctant to SNF placement. Per sister they haven't had time to even go look at the facilities therefore if pt is discharged then they would have pt sent home. CSW advised MD of concerns and MD expressed that they will hold pt til tomorrow and then they will discharge pt to SNF or to home. CSW and RN update pt and sister of this at this time.    CSW spoke with pt and sister Rachael Robertson at bedside. CSW sought further details on a bed choice for pt. Per sister Rachael Robertson they are looking for pt to go home as their first choice and would like to have someone come in pt's home. .CSW advised Rachael Robertson that if they  are looking for someone to stay with pt then they would need to research private care services as the hospital is unable to set that up for pt. Rachael Robertson understanding and expressed that she would. CSW expressed that she would follow back up with pt on Wednesday to see if she is still interested in going home of if she wants to go to SNF.      Rachael Robertson, MSW, LCSW-A Emergency Department Clinical Social Worker [phone]

## 2018-08-02 NOTE — Progress Notes (Signed)
Internal Medicine Attending:   I saw and examined the patient. I reviewed the resident's note and I agree with the resident's findings and plan as documented in the resident's note.  Patient feels well today with no new complaints.  Patient is initially admitted to the hospital with a right shin hematoma and acute blood loss anemia.  She is now status post right shin hematoma evacuation by plastic surgery as well as excision of necrotic skin over the area.  Dressing over the area is clean and intact.  Hemoglobin is been stable.  Patient stable for DC to SNF once bed is available.  No further work-up at this time.  Of note, Xarelto was stopped on admission secondary to recurrent episodes of bleeding.  We will not resume this medication at this time.  Patient and her family are still deciding on which SNF for patient to go to or if they will take the patient home.  Patient will likely be DC'd home tomorrow.  Patient also noted to have a right lower quadrant mass.  CT consistent with a fluid collection.  General surgery to follow-up with patient as an outpatient for further evaluation of this.  No further work-up at this time.

## 2018-08-02 NOTE — Progress Notes (Signed)
Subjective: Rachael Robertson was doing well today, no acute events overnight. She reports that she has occasional stinging in her right leg, denies any new complaints. We discussed that we should be able to discharge her today, she reports that she is still unsure about where she wants to go, she is still trying to see if she can go home but is aware that her wound vac would need to be checked.    Objective:  Vital signs in last 24 hours: Vitals:   08/02/18 0300 08/02/18 0324 08/02/18 0400 08/02/18 0500  BP:      Pulse:  (!) 54  (!) 54  Resp: 16 17 18 16   Temp:      TempSrc:      SpO2:    95%    General: Obese female, NAD, trach in place Cardiac:RRR, no m/r/g Pulmonary: Coarse breath sounds, normal work of breathing Abdomen: Obese, RLG mass Extremity: Right HV:FMBB wrapped in bandage, wound vac in place, warm extremities, pulses intact  Assessment/Plan:  Active Problems:   Chronic respiratory failure with hypoxia (HCC)   Tracheostomy status (HCC)   Subcutaneous hematoma   [name] with a history of chronic hypercapnea 2/2 OHS, OSA on chronic trach (on 2L supplemental O2 via trilogy ventilator), HFrEF (EF 45-50%), CKD stage 3, history of PE on Xarelto, and chronic skin bruising who presented from clinic for worsening rightshinhematoma and anemia.Found to have normal vital signs. Initial CBC showed Hgb 8.9, Hgb on discharge was 7.6.  Rightshinhematoma: Anemia: -Improving, s/p evacuation by Plastic Surgeon Dr. Ulice Bold. She is doing well today, wound vac in place, sensation intact, pulses intact, and warm extremities. She still has some stinging pain that occurs. Hemoglobin has been stable, today it is 8.0. Given her multiple episode of bleeding we should stop her anticoagulation on discharge.  -S/1 1 unit pRBC on 2/18 -Daily CBC -Transfusion goal >7 -Holding home Gibson Ramp, will stop this on discharge -Plastic surgery consulted > took patient for  evacuation of hematoma on 2/20 -PT/OT recommended SNF placement -CSW reported that [facility] would not accept patient to [facility] as they felt she is not appropriate for [facility] due to lack of support at home.  -She will need SNF placement on discharge, she is reluctant about going to a SNF and her family is trying to figure out other options. Appreciate CSW assisting with this.   Chronic anticoagulation on Xeralto for PE: -Holding home Gibson Ramp -Will stop [place] discharge, she was not taking it for a few days prior to admission and was still having bleeding episodes -Continue to monitor for clots and bleeding   Right lower quadrant mass: -CT abdomen pelvis with contrast showed a partially visualized complex fluid collection measuring at least 18 cm in diameter, and a smaller hyperdense fluid collection measuring 4.6 cm. Spoke with plastic surgery who recommended general surgery consult -Contacted surgery who reported that since this is not an urgent matter she can follow-up with them outpatient to assess this mass. Placed information in her discharge paperwork.  -No further work-up inpatient   Frequent PVCs: -Noted on telemetry, she has a history of frequent PVCs. She is on carvedilol at home that she only takes if her blood pressure allows. Given the frequency of her PVCs there is a concern for development of a cardiomyopathy. Metoprolol was started however she was having some hypotension so this was held.  -Continue metoprolol tartrate 12.5 BID with hold parameters of systolic <100.   Chronic  hypercapnic respiratory failure: OSA,OHS: Chronic trach: -No acute events overnight, tolerating trach well.  -Pulmonology following, appreciate assistance -Continue albuterol PRN -ContinueBiPAP on trachat night -Limit suctioning given history of hemoptysis  Intertrigo: -Continue topical nystatin -Fluconazole today, will need weekly fluconazole dose, first dose was 2/11.  -2nd dose given on  2/18  Acute gout of left thumb: -Continue colchicine 0.6 mg daily -Continue Voltaren gel -Hold allopurinol  FEN:No fluids, replete lytes prn, NPO, carb modified diet VTE QMG:NOIB Code Status: FULL   Dispo: Anticipated discharge is pending SNF placement   [name], MD 08/02/2018, 6:28 AM Pager: [phone]

## 2018-08-02 NOTE — Progress Notes (Signed)
Physical Therapy Treatment Patient Details Name: Rachael Robertson MRN: [id number] DOB: February 09, 1951 Today's Date: 08/02/2018    History of Present Illness 68 year old female with a past medical history of chronic respiratory failure secondary to OHS/OSA requiring nocturnal ventilation via tracheostomy, chronic systolic heart failure with an EF of 45 to 50%, CKD stage III, history of PE on anticoagulation who presented to the internal medicine clinic with worsening right shin hematoma.  Pt underwent evacuation of R leg hematoma and wound vac placement on 07/29/18.      PT Comments    Pt was able to stand EOB with min assist overall, but unable to take any steps or side steps.  She reports she is weaker than the last time she attempted to stand (5 days ago).  She is good at directing her care and helping [place] know how best to help her mobilize.  Most assist is needed to come up to sitting EOB and help her lift both of her legs back into bed.  VSS on TC FiO2 20% 5L/min sustaining sats in the mid 90s throughout session and HR in the 70s.  PT will continue to follow acutely for safe mobility progression  Follow Up Recommendations  SNF(if she refuses SNF, ambulance transport home/[facility] services)     Equipment Recommendations  None recommended by PT    Recommendations for Other Services   NA     Precautions / Restrictions Precautions Precautions: Fall Precaution Comments: trach Required Braces or Orthoses: Other Brace Other Brace: wears shoes for standing. Wound vac to R lower leg.     Mobility  Bed Mobility Overal bed mobility: Needs Assistance Bed Mobility: Supine to Sit;Sit to Supine     Supine to sit: Min assist;HOB elevated Sit to supine: Mod assist   General bed mobility comments: Min hand held assist to come to EOB , HOB ~20 degrees, Assist of both legs to return to supine.   Transfers Overall transfer level: Needs assistance Equipment used: Rolling walker (2 wheeled)(bari RW  that is from home) Transfers: Sit to/from Stand Sit to Stand: Min assist;+2 physical assistance;From elevated surface         General transfer comment: Two person min assist to stabilize and block RW.  Pt standing from elevated bed with mattress deflated.    Ambulation/Gait             General Gait Details: unable to even side step today          Balance Overall balance assessment: Needs assistance Sitting-balance support: Feet supported;No upper extremity supported;Bilateral upper extremity supported Sitting balance-Leahy Scale: Fair Sitting balance - Comments: close supervision EOB.    Standing balance support: Bilateral upper extremity supported Standing balance-Leahy Scale: Poor Standing balance comment: needs external support from RW in standing, pt forward flexed over the walker leaning on her elbows and forearms as she is unable to push through her left wrist (gout flare).  She reports she normally forward flexes "to be able to move" as her back will not leg her move if she stands upright.                              Cognition Arousal/Alertness: Awake/alert Behavior During Therapy: WFL for tasks assessed/performed Overall Cognitive Status: Within Functional Limits for tasks assessed  Pertinent Vitals/Pain Pain Assessment: Faces Faces Pain Scale: Hurts even more Pain Location: R lower leg in standing Pain Descriptors / Indicators: Burning Pain Intervention(s): Limited activity within patient's tolerance;Monitored during session;Repositioned        PT Goals (current goals can now be found in the care plan section) Acute Rehab PT Goals Patient Stated Goal: return home avoid going to SNF Progress towards PT goals: Not progressing toward goals - comment(weaker today)    Frequency    Min 3X/week      PT Plan Current plan remains appropriate       AM-PAC PT "6 Clicks"  Mobility   Outcome Measure  Help needed turning from your back to your side while in a flat bed without using bedrails?: A Little Help needed moving from lying on your back to sitting on the side of a flat bed without using bedrails?: A Little Help needed moving to and from a bed to a chair (including a wheelchair)?: Total Help needed standing up from a chair using your arms (e.g., wheelchair or bedside chair)?: A Little Help needed to walk in hospital room?: Total Help needed climbing 3-5 steps with a railing? : Total 6 Click Score: 12    End of Session Equipment Utilized During Treatment: Oxygen(TC FiO2 20% on 5 L) Activity Tolerance: Patient limited by fatigue;Patient limited by pain Patient left: in bed;with call bell/phone within reach Nurse Communication: Mobility status PT Visit Diagnosis: Other abnormalities of gait and mobility (R26.89);Repeated falls (R29.6);Muscle weakness (generalized) (M62.81)     Time: 4496-7591 PT Time Calculation (min) (ACUTE ONLY): 56 min  Charges:  $Therapeutic Activity: 53-67 mins                    Azizi Bally B. Dossie Ocanas, PT, DPT  Acute Rehabilitation [phone] pager #[phone] office   08/02/2018, 1:07 PM

## 2018-08-03 MED ORDER — METOPROLOL TARTRATE 25 MG PO TABS: 12.5000 mg | ORAL_TABLET | Freq: Two times a day (BID) | ORAL | 0 refills | Status: DC

## 2018-08-03 MED ORDER — COLCHICINE 0.6 MG PO TABS: 0.6000 mg | ORAL_TABLET | Freq: Every day | ORAL | 0 refills | Status: AC

## 2018-08-03 NOTE — Progress Notes (Addendum)
2:35pm-CSW spoke with pt and sister at bedside. To give update. Plan still remains for pt to be transported to [facility] on tomorrow. CSW spoke with MD to express concerns of sister wanting to ensure that pt has everything that she needs. MD expressed that she would ensure that discharge summary is up to date by tomorrow  CSW received update that pt is expected to go to [facility]. CSW spoke with Olegario Messier from [facility] and as informed that they are unable to take pt today as they are needing to order all of the equipment for pt at this time. CSW spoke with Olegario Messier to give update on trach size, Wound Vac, as well as made her aware of the settings, machine, and the mode in which the machines are in. At this time CSW has updated RN, pt and MD of transport for tomorrow. All understanding and agreeable at this time.    Claude Manges Remon Quinto, MSW, LCSW-A Emergency Department Clinical Social Worker [phone]

## 2018-08-03 NOTE — Progress Notes (Signed)
Subjective: Ms. Blanche reports that she is doing well, no acute events overnight. She reports that her leg is feeling better today, and that her left hand is improving. She denied any new complaints.  She reported that they have decided to go to [facility] on discharge. We discussed that we will work on the paperwork and that she can likely go later today.   Objective:  Vital signs in last 24 hours: Vitals:   08/03/18 0000 08/03/18 0316 08/03/18 0346 08/03/18 0404  BP: 128/67 (!) 127/46  (!) 104/57  Pulse: 67 (!) 54  (!) 50  Resp: (!) 23 16  14   Temp:   98.2 F (36.8 C)   TempSrc:   Oral   SpO2: 95% 100%  100%    General: Obese female, NAD, trach in place Cardiac: RRR, no m/r/g Pulmonary: Coarse breath sounds bilaterally, normal work of breathing Abdomen: Obese, RLQ mass, non-tender Extremity:Right lower extremity wrapped in bandage, good pulses and warm extremity   Assessment/Plan:  Active Problems:   Chronic respiratory failure with hypoxia (HCC)   Tracheostomy status (HCC)   Subcutaneous hematoma  [name] with a history of chronic hypercapnea 2/2 OHS, OSA on chronic trach (on 2L supplemental O2 via trilogy ventilator), HFrEF (EF 45-50%), CKD stage 3, history of PE on Xarelto, and chronic skin bruising who presented from clinic for worsening rightshinhematoma and anemia.  Rightshinhematoma: Anemia: -S/p evacuation of hematoma on 2/20 by Plastic Surgeon Dr. Ulice Bold. Wound vac in place, she reports that her leg is feeling better today, on exam she has good pulses and warm extremities, wound vac in place. . -Given her multiple episode of bleeding we should stop her anticoagulation on discharge.  -S/1 1 unit pRBC on 2/18 -Daily CBC, Transfusion goal >7 -Continue tramadol 50 mg q6 hr PRN -Holding home Gibson Ramp, will stop this on discharge -PT/OT recommended SNF placement -CSW reported that [facility] would not accept patient to [facility] as they felt she is  not appropriate for [facility] due to lack of support at home.  -She is agreeable to SNF placement on discharge. Appreciate CSW assistance with this.   Chronic anticoagulation on Xeralto for PE: -Holding home Gibson Ramp -Will stop [place] discharge, she was not taking it for a few days prior to admission and was still having bleeding episodes -Continue to monitor for clots and bleeding   Right lower quadrant mass: -CT abdomen pelvis with contrast showed a partially visualized complex fluid collection measuring at least 18 cm in diameter, and a smaller hyperdense fluid collection measuring 4.6 cm. Spoke with plastic surgery who recommended general surgery consult -Contacted surgery who reported that since this is not an urgent matter she can follow-up with them outpatient to assess this mass. Placed information in her discharge paperwork.  -No further work-up inpatient   Frequent PVCs: -Noted on telemetry, she has a history of frequent PVCs. She is on carvedilol at home that she only takes if her blood pressure allows. Given the frequency of her PVCs there is a concern for development of a cardiomyopathy. Metoprolol was started however she was having some hypotension so this was held.  -Continue metoprolol tartrate 12.5 BID with hold parameters of systolic <100 on discharge.   Chronic hypercapnic respiratory failure: OSA,OHS: Chronic trach: -Pulmonology following, appreciate assistance with management -Continue albuterol PRN -ContinueBiPAP on trachat night -Limit suctioning given history of hemoptysis  Intertrigo: -Continue topical nystatin -Fluconazole today, will need weekly fluconazole dose, first dose was  2/11.  -2nd dose given on 2/18 -Fluconazole 3rd dose 2/25  Acute gout of left thumb: -Continue colchicine 0.6 mg daily -Continue Voltaren gel -Hold allopurinol  FEN:No fluids, replete lytes prn, NPO,carb modified diet VTE UQJ:FHLK Code Status: FULL   Dispo:  Anticipated discharge is today pending SNF placement.   [name], MD 08/03/2018, 6:18 AM Pager: [phone]

## 2018-08-03 NOTE — Discharge Instructions (Signed)
[facility],   It has been a pleasure working with you and we are glad you're feeling better. You were hospitalized for a right shin hematoma, the blood was evacuated by plastic surgery and a wound vac was placed to help with wound healing. Please follow up with Dr. Ulice Bold in 1-2 weeks to assess your wound. You need to have your twice weekly dressing changes. Because of your frequent episodes of bleeding and significant drop in your hemoglobin please stop taking your Xeralto. For your gout, please restart your allopurinol and start taking colchicine until you follow up with your PCP, we will check your uric acid levels at that time to see if you can stop colchicine.     For your abdominal mass, please follow up with surgery to see if this is surgically removable and to discuss your options with them.   We also noted that your blood pressures were low while you were here, please stop taking the spironolactone and the carvedilol for now until you follow up with your PCP. We started you on metoprolol for your abnormal heart rhythm, please only take this when your systolic blood pressure is >100.   Follow up with your primary care provider in 1-2 weeks  If your symptoms worsen or you develop new symptoms, please seek medical help whether it is your primary care provider or emergency department.  If you have any questions about this hospitalization please call [phone].

## 2018-08-03 NOTE — Progress Notes (Addendum)
Subjective: Very pleasant female pt is overall doing well today.  Pt is 5 days status post evacuation of right lower extremity hematoma.  Pt reports her wound can "sting" at times. The pt has resolved herself to go into a Rehab facility for the time being.  Pt has chronic co-morbidity of hypercapnea 2/2 OHS, OSA and is on chronic trach, HFrEF and CKD.  Pt has been well cared for on [facility].  I am hopeful she will be able to continue with PT and OT at the SNF and progress to home, which is her desire.   Objective: Vital signs in last 24 hours: Temp:  [98 F (36.7 C)-98.5 F (36.9 C)] 98.2 F (36.8 C) (02/25 0346) Pulse Rate:  [50-68] 50 (02/25 0404) Resp:  [14-23] 14 (02/25 0404) BP: (103-128)/(46-67) 104/57 (02/25 0404) SpO2:  [91 %-100 %] 100 % (02/25 0404) FiO2 (%):  [28 %-35 %] 35 % (02/25 0316) Weight change:  Last BM Date: 08/01/18  Intake/Output from previous day: 02/24 0701 - 02/25 0700 In: 400 [P.O.:400] Out: 600 [Urine:600] Intake/Output this shift: No intake/output data recorded.  Alert and Oriented In good spirits Obese Trach in place Vac in place and functioning well Wound is 9 x 15 cm adaptic left in place Bloody discharge noted Picture taken for Epic Significant improvement of edema and erythema of RLE since surgery  Lab Results: Recent Labs    08/01/18 0217 08/02/18 0254  WBC 5.5 4.8  HGB 7.5* 8.0*  HCT 26.1* 27.1*  PLT 177 190   BMET Recent Labs    08/01/18 0217 08/02/18 0254  NA 138 138  K 4.7 4.6  CL 98 99  CO2 28 31  GLUCOSE 87 79  BUN 37* 32*  CREATININE 1.33* 1.13*  CALCIUM 8.0* 8.1*    Studies/Results: No results found.  Medications: I have reviewed the patient's current medications.  Assessment/Plan: Changed wound vac today We would like the SNF to change vac twice a week.  We request that they use an adaptic. We would like to see the pt in clinic next week.  LOS: 8 days    Everlean Cherry, [facility] Plastic  Surgery [phone] Office number for appt [phone] 08/03/2018

## 2018-08-03 NOTE — Progress Notes (Signed)
Internal Medicine Attending:   I saw and examined the patient. I reviewed the resident's note and I agree with the resident's findings and plan as documented in the resident's note.  Patient feels well today with no new complaints.  She states that she had her lower extremity dressing changed this morning and denies any pain currently.  Patient was initially admitted to the hospital with a right shin hematoma status post evacuation on February 28 by Dr. Ulice Bold.  Continue with wound VAC and dressings per plastics.  Hemoglobin is stable.  Continue with pain control for now.  Xarelto has been discontinued.  Patient is stable for DC to SNF once bed is available.  Of note, patient was found to have a right lower quadrant mass which on CT showed a complex fluid collection.  Case was discussed with general surgery who recommended outpatient follow-up.  No further work-up at this time.

## 2018-08-04 DIAGNOSIS — Z91018 Allergy to other foods: Secondary | ICD-10-CM

## 2018-08-04 DIAGNOSIS — Z881 Allergy status to other antibiotic agents status: Secondary | ICD-10-CM

## 2018-08-04 DIAGNOSIS — Z886 Allergy status to analgesic agent status: Secondary | ICD-10-CM

## 2018-08-04 DIAGNOSIS — Z91040 Latex allergy status: Secondary | ICD-10-CM

## 2018-08-04 DIAGNOSIS — Z91048 Other nonmedicinal substance allergy status: Secondary | ICD-10-CM

## 2018-08-04 MED ORDER — DAILY PROBIOTIC PO CAPS: 1.0000 | ORAL_CAPSULE | Freq: Every day | ORAL | 0 refills | Status: AC

## 2018-08-04 NOTE — Progress Notes (Signed)
Internal Medicine Attending:   I saw and examined the patient. I reviewed the resident's note and I agree with the resident's findings and plan as documented in the resident's note.  Patient feels well today with no new complaints.  She states that her right lower extremity is improving and she does not have any pain currently.  Patient also had a gout attack in her left second MCP which is now improving.  Patient was initially admitted to the hospital with a right shin hematoma which is now status post evacuation.  Continue with wound VAC per plastics.  Patient to follow-up with Dr. Ulice Bold as an outpatient.  Hemoglobin remained stable.  Continue pain control.  Xarelto has been discontinued.  PT/OT recommending SNF placement.  Patient stable for discharge to SNF today.  No further work-up at this time.  Patient to follow-up in [facility] as an outpatient.

## 2018-08-04 NOTE — Progress Notes (Signed)
Placed patient on AVAPS for the night with VT at , Max pressure at 25cm, Fio2 35%, EPAP at 5cm.

## 2018-08-04 NOTE — Progress Notes (Signed)
Pt requested to be placed on TC at this time. RT will continue to monitor.

## 2018-08-04 NOTE — Progress Notes (Signed)
   Subjective: Ms. killelea was doing well today, no acute events overnight. She reports that her right leg is feeling better, denies any significant pain. She also reports that her left hand is feeling better. We discussed that she will be going to the SNF and she was in agreement. She reported that she takes supllements and wanted those included in the discharge paperwork.    Objective:  Vital signs in last 24 hours: Vitals:   08/04/18 0325 08/04/18 0342 08/04/18 0415 08/04/18 0606  BP:   (!) 101/53 (!) 110/46  Pulse: (!) 56  (!) 49 (!) 54  Resp: 16  15 10   Temp:  98.3 F (36.8 C)    TempSrc:  Oral    SpO2: 100%  100% 100%    General: Chronically ill appearing, NAD, trach in place Cardiac: RRR, no m/r/g Pulmonary: Coarse breath sounds bilaterally Extremity: Right shin in bandage, warm extremities and good pulses   Assessment/Plan:  Active Problems:   Chronic respiratory failure with hypoxia (HCC)   Tracheostomy status (HCC)   Subcutaneous hematoma [name] with a history of chronic hypercapnea 2/2 OHS, OSA on chronic trach (on 2L supplemental O2 via trilogy ventilator), HFrEF (EF 45-50%), CKD stage 3, history of PE on Xarelto, and chronic skin bruising who presented from clinic for worsening rightshinhematoma and anemia.  Rightshinhematoma: Anemia: -Today she reports that the pain has improved. Wound vac in place, she reports that her leg is feeling better today, on exam she has good pulses and warm extremities, wound vac in place. Hgb has been stable. S/p evacuation of hematoma on 2/20 by Plastic Surgeon Dr. Ulice Bold. -Given her multiple episode of bleeding we stopped her anticoagulation on discharge.  -S/1 1 unit pRBC on 2/18 -Daily CBC, Transfusion goal >7 -Holding home [place], will stop this on discharge -PT/OT recommended SNF placement -Discharge to SNF today  Chronic anticoagulation on Xeralto for PE: -Holding home [place] -Will stop  [place] discharge, she was not taking it for a few days prior to admission and was still having bleeding episodes -Continue to monitor for clots and bleeding   Right lower quadrant mass: -No further work-up inpatient -Follow up with surgery outpatient  Frequent PVCs: -Noted on telemetry, she has a history of frequent PVCs. She is on carvedilol at home that she only takes if her blood pressure allows. Given the frequency of her PVCs there is a concern for development of a cardiomyopathy.Metoprolol was started however she was having some hypotension so this was held. -Continuemetoprolol tartrate 12.5 BIDwith hold parameters of systolic <100 on discharge.   Chronic hypercapnic respiratory failure: OSA,OHS: Chronic trach: -Pulmonology following, appreciate assistance with management -Continue albuterol PRN -ContinueBiPAP on trachat night -Limit suctioning given history of hemoptysis  Intertrigo: -Continue topical nystatin -Fluconazole today, will need weekly fluconazole dose, first dose was 2/11.  -2nd dose given on 2/18 -Fluconazole 3rd dose 2/25  Acute gout of left thumb: -Continue colchicine 0.6 mg daily -Continue Voltaren gel -Continue allopurinol on discharge  FEN:No fluids, replete lytes prn, NPO,carb modified diet VTE LPF:XTKW Code Status: FULL   Dispo: Anticipated discharge in approximately today.   [name], MD 08/04/2018, 6:38 AM Pager: [phone]

## 2018-08-04 NOTE — Progress Notes (Signed)
Pt being discharged to [facility]. Nurse called report to Arley Phenix, RN. Nurse will remove pt midline catheter. PT has called sister and notified her of transfer. Pt will be transported by [facility].

## 2018-08-04 NOTE — Care Management Important Message (Signed)
Important Message  Patient Details  Name: Rachael Robertson MRN: [id number] Date of Birth: 08/21/1950   Medicare Important Message Given:  Yes    Cherylann Parr, RN 08/04/2018, 11:56 AM

## 2018-08-04 NOTE — Progress Notes (Signed)
[facility] picked up pt. No distress noted. VSS at time of discharge. PT belongings given to patient sister. Meds and discharge instructions sent with patient.

## 2018-08-04 NOTE — Progress Notes (Addendum)
10:57am- CSW spoke with sister Lyla Son to give updating pt going to [facility]. CSW also sought further details from sister on if she could pick pt's wheelchair up and take it to the facility as [facility] is unable to transport wheelchairs. Sister expressed that she doesn't have a car at this time and that she would have to wait for her daughter to arrive and then come get wheelchair from the hospital. Sister expressed that if staff allowed wheelchair to stay on the unit then they would arrive to get it today. CSW did advise sister that wheelchair must be picked up today and that when they arrived go to [facility] to pick up wheelchair. Sister expressed understanding and has planned to pick wheelchair up today. CSW will sign of at this time.    10:05am-CSW spoke with Clydie Braun from [facility] and was informed that hey are able to take pt. RN to call report to [phone]. Pt will go to room 128B. CSW has called and paged MD to give update at this time.    CSW aware that pt is expected to discharge today to [facility]. CSW spoke with Clydie Braun and Clydie Braun expressed that she would give CSW call once they are able to take pt today. CSW awaits call from Clydie Braun with [facility] at this time.     Claude Manges Damichael Hofman, MSW, LCSW-A Emergency Department Clinical Social Worker [phone]

## 2018-08-06 ENCOUNTER — Telehealth: Payer: Self-pay

## 2018-08-06 NOTE — Telephone Encounter (Signed)
Call from pt to request advice regarding her wound/dressing care Pt is calling from [facility] Address: [address] [place], [place] [address] Ph# [phone]  Pt states that she is scheduled to be in [facility] for 4-6 weeks, she is in room 128B under the care of Dr. Brain Hilts (wound care)  Per pt  Dr. Vedia Coffer he ordered the wound vac to be removed, and the pt is questioning this order.  I have consulted with Dr. Ulice Bold & she wants to continue the wound vac  Pt will obtain Dr. Maia Plan contact information & Dr. Ulice Bold will forward her orders to him  Pt will call back with this info.

## 2018-08-06 NOTE — Telephone Encounter (Signed)
Call back from pt. To report that the staff at the [facility] only had Dr. Maia Plan cell ph#- he does not have an office phone, and the staff would not provide his cell ph#  I will consult with Dr. Ulice Bold & have her forward the orders for the wound vac to the rehab facility Ph# [phone]  Fax#[phone]  Pt agrees & will call for any further concerns.  Enedina Finner, RNFA

## 2018-08-10 ENCOUNTER — Encounter: Payer: Self-pay | Admitting: Plastic Surgery

## 2018-08-10 ENCOUNTER — Ambulatory Visit (INDEPENDENT_AMBULATORY_CARE_PROVIDER_SITE_OTHER): Payer: Medicare Other | Admitting: Plastic Surgery

## 2018-08-10 DIAGNOSIS — S81801A Unspecified open wound, right lower leg, initial encounter: Secondary | ICD-10-CM | POA: Insufficient documentation

## 2018-08-10 DIAGNOSIS — I890 Lymphedema, not elsewhere classified: Secondary | ICD-10-CM | POA: Insufficient documentation

## 2018-08-10 NOTE — Progress Notes (Signed)
   Subjective:    Patient ID: Rachael Robertson, female    DOB: 17-Jun-1950, 68 y.o.   MRN: [id number]  The patient is a 68 year old female here for follow-up on her right leg wound.  Her niece is with her today.  She comes in on her electric wheelchair.  The right lower leg is a little bit red.  The sore back and ACell are in place.  It does appear to be improving.  She has quite a bit of lymph drainage.  This is in relation to her bilateral lymphedema.  The VAC has not been on.  It was removed when she got to the nursing home according to the patient.  I received a call regarding this from the patient.  I called the nursing facility and requested for the [facility] to be reapplied.  It does not appear that it was I have written a prescription and requested reapplication.   Review of Systems  Constitutional: Negative.        Objective:   Physical Exam Vitals signs and nursing note reviewed.  Constitutional:      Appearance: Normal appearance.  Cardiovascular:     Rate and Rhythm: Normal rate.  Musculoskeletal:        General: Swelling, tenderness and deformity present.     Right lower leg: Edema present.     Left lower leg: Edema present.       Legs:  Skin:    General: Skin is warm.  Neurological:     Mental Status: She is alert.  Psychiatric:        Mood and Affect: Mood normal.        Thought Content: Thought content normal.   Trach in place and on oxygen      Assessment & Plan:   Lymphedema of both lower extremities  Wound of right lower extremity, initial encounter  K-Y jelly was applied.  Recommend replacing the [facility] and wrote a prescription.  Would like to see her back in 2 weeks.

## 2018-08-18 ENCOUNTER — Emergency Department (HOSPITAL_COMMUNITY): Payer: Medicare Other

## 2018-08-18 ENCOUNTER — Encounter (HOSPITAL_COMMUNITY): Payer: Self-pay | Admitting: Emergency Medicine

## 2018-08-18 ENCOUNTER — Other Ambulatory Visit: Payer: Self-pay

## 2018-08-18 ENCOUNTER — Inpatient Hospital Stay (HOSPITAL_COMMUNITY)
Admission: EM | Admit: 2018-08-18 | Discharge: 2018-08-25 | DRG: 264 | Disposition: A | Discharge status: Home Health | Payer: Medicare Other | Attending: Internal Medicine | Admitting: Internal Medicine

## 2018-08-18 DIAGNOSIS — Z91048 Other nonmedicinal substance allergy status: Secondary | ICD-10-CM | POA: Diagnosis not present

## 2018-08-18 DIAGNOSIS — Z86711 Personal history of pulmonary embolism: Secondary | ICD-10-CM | POA: Diagnosis not present

## 2018-08-18 DIAGNOSIS — Z930 Tracheostomy status: Secondary | ICD-10-CM | POA: Diagnosis not present

## 2018-08-18 DIAGNOSIS — J9600 Acute respiratory failure, unspecified whether with hypoxia or hypercapnia: Secondary | ICD-10-CM | POA: Diagnosis present

## 2018-08-18 DIAGNOSIS — Z91040 Latex allergy status: Secondary | ICD-10-CM

## 2018-08-18 DIAGNOSIS — J9622 Acute and chronic respiratory failure with hypercapnia: Secondary | ICD-10-CM | POA: Diagnosis present

## 2018-08-18 DIAGNOSIS — J449 Chronic obstructive pulmonary disease, unspecified: Secondary | ICD-10-CM

## 2018-08-18 DIAGNOSIS — Z6841 Body Mass Index (BMI) 40.0 and over, adult: Secondary | ICD-10-CM

## 2018-08-18 DIAGNOSIS — M109 Gout, unspecified: Secondary | ICD-10-CM

## 2018-08-18 DIAGNOSIS — J209 Acute bronchitis, unspecified: Secondary | ICD-10-CM

## 2018-08-18 DIAGNOSIS — Z9911 Dependence on respirator [ventilator] status: Secondary | ICD-10-CM

## 2018-08-18 DIAGNOSIS — I5043 Acute on chronic combined systolic (congestive) and diastolic (congestive) heart failure: Secondary | ICD-10-CM

## 2018-08-18 DIAGNOSIS — D509 Iron deficiency anemia, unspecified: Secondary | ICD-10-CM

## 2018-08-18 DIAGNOSIS — Z803 Family history of malignant neoplasm of breast: Secondary | ICD-10-CM | POA: Diagnosis not present

## 2018-08-18 DIAGNOSIS — I890 Lymphedema, not elsewhere classified: Secondary | ICD-10-CM

## 2018-08-18 DIAGNOSIS — Z882 Allergy status to sulfonamides status: Secondary | ICD-10-CM

## 2018-08-18 DIAGNOSIS — J9621 Acute and chronic respiratory failure with hypoxia: Secondary | ICD-10-CM | POA: Diagnosis present

## 2018-08-18 DIAGNOSIS — N183 Chronic kidney disease, stage 3 (moderate): Secondary | ICD-10-CM | POA: Diagnosis present

## 2018-08-18 DIAGNOSIS — S8011XA Contusion of right lower leg, initial encounter: Secondary | ICD-10-CM

## 2018-08-18 DIAGNOSIS — Z7951 Long term (current) use of inhaled steroids: Secondary | ICD-10-CM

## 2018-08-18 DIAGNOSIS — Z886 Allergy status to analgesic agent status: Secondary | ICD-10-CM

## 2018-08-18 DIAGNOSIS — S301XXA Contusion of abdominal wall, initial encounter: Secondary | ICD-10-CM

## 2018-08-18 DIAGNOSIS — J810 Acute pulmonary edema: Secondary | ICD-10-CM | POA: Insufficient documentation

## 2018-08-18 DIAGNOSIS — I130 Hypertensive heart and chronic kidney disease with heart failure and stage 1 through stage 4 chronic kidney disease, or unspecified chronic kidney disease: Secondary | ICD-10-CM | POA: Diagnosis present

## 2018-08-18 DIAGNOSIS — X58XXXA Exposure to other specified factors, initial encounter: Secondary | ICD-10-CM | POA: Diagnosis not present

## 2018-08-18 DIAGNOSIS — M1A9XX0 Chronic gout, unspecified, without tophus (tophi): Secondary | ICD-10-CM | POA: Insufficient documentation

## 2018-08-18 DIAGNOSIS — Z8249 Family history of ischemic heart disease and other diseases of the circulatory system: Secondary | ICD-10-CM | POA: Diagnosis not present

## 2018-08-18 DIAGNOSIS — Z9981 Dependence on supplemental oxygen: Secondary | ICD-10-CM

## 2018-08-18 DIAGNOSIS — J440 Chronic obstructive pulmonary disease with acute lower respiratory infection: Secondary | ICD-10-CM | POA: Diagnosis present

## 2018-08-18 DIAGNOSIS — M1990 Unspecified osteoarthritis, unspecified site: Secondary | ICD-10-CM | POA: Diagnosis present

## 2018-08-18 DIAGNOSIS — E039 Hypothyroidism, unspecified: Secondary | ICD-10-CM | POA: Diagnosis not present

## 2018-08-18 DIAGNOSIS — E739 Lactose intolerance, unspecified: Secondary | ICD-10-CM | POA: Diagnosis present

## 2018-08-18 DIAGNOSIS — Z881 Allergy status to other antibiotic agents status: Secondary | ICD-10-CM

## 2018-08-18 DIAGNOSIS — S3011XA Contusion of abdominal wall, initial encounter: Secondary | ICD-10-CM

## 2018-08-18 DIAGNOSIS — E875 Hyperkalemia: Secondary | ICD-10-CM | POA: Diagnosis not present

## 2018-08-18 DIAGNOSIS — Z978 Presence of other specified devices: Secondary | ICD-10-CM

## 2018-08-18 DIAGNOSIS — Z808 Family history of malignant neoplasm of other organs or systems: Secondary | ICD-10-CM | POA: Diagnosis not present

## 2018-08-18 DIAGNOSIS — Z7722 Contact with and (suspected) exposure to environmental tobacco smoke (acute) (chronic): Secondary | ICD-10-CM | POA: Diagnosis present

## 2018-08-18 DIAGNOSIS — R0600 Dyspnea, unspecified: Secondary | ICD-10-CM

## 2018-08-18 DIAGNOSIS — Z79899 Other long term (current) drug therapy: Secondary | ICD-10-CM

## 2018-08-18 DIAGNOSIS — I493 Ventricular premature depolarization: Secondary | ICD-10-CM

## 2018-08-18 DIAGNOSIS — G4733 Obstructive sleep apnea (adult) (pediatric): Secondary | ICD-10-CM | POA: Diagnosis present

## 2018-08-18 DIAGNOSIS — E662 Morbid (severe) obesity with alveolar hypoventilation: Secondary | ICD-10-CM

## 2018-08-18 DIAGNOSIS — I4510 Unspecified right bundle-branch block: Secondary | ICD-10-CM

## 2018-08-18 DIAGNOSIS — J811 Chronic pulmonary edema: Secondary | ICD-10-CM | POA: Diagnosis not present

## 2018-08-18 DIAGNOSIS — Z79890 Hormone replacement therapy (postmenopausal): Secondary | ICD-10-CM

## 2018-08-18 DIAGNOSIS — I5023 Acute on chronic systolic (congestive) heart failure: Secondary | ICD-10-CM

## 2018-08-18 DIAGNOSIS — Z91018 Allergy to other foods: Secondary | ICD-10-CM

## 2018-08-18 HISTORY — DX: Disorder of kidney and ureter, unspecified: N28.9

## 2018-08-18 HISTORY — DX: Unspecified osteoarthritis, unspecified site: M19.90

## 2018-08-18 HISTORY — DX: Chronic obstructive pulmonary disease, unspecified: J44.9

## 2018-08-18 HISTORY — DX: Unspecified asthma, uncomplicated: J45.909

## 2018-08-18 HISTORY — DX: Heart failure, unspecified: I50.9

## 2018-08-18 LAB — CBC WITH DIFFERENTIAL/PLATELET
ABS IMMATURE GRANULOCYTES: 0.06 10*3/uL (ref 0.00–0.07)
Basophils Absolute: 0 10*3/uL (ref 0.0–0.1)
Basophils Relative: 1 %
Eosinophils Absolute: 0.1 10*3/uL (ref 0.0–0.5)
Eosinophils Relative: 3 %
HCT: 28.5 % — ABNORMAL LOW (ref 36.0–46.0)
HEMOGLOBIN: 7.9 g/dL — AB (ref 12.0–15.0)
Immature Granulocytes: 2 %
Lymphocytes Relative: 20 %
Lymphs Abs: 0.8 10*3/uL (ref 0.7–4.0)
MCH: 26.6 pg (ref 26.0–34.0)
MCHC: 27.7 g/dL — ABNORMAL LOW (ref 30.0–36.0)
MCV: 96 fL (ref 80.0–100.0)
Monocytes Absolute: 0.3 10*3/uL (ref 0.1–1.0)
Monocytes Relative: 7 %
NEUTROS PCT: 67 %
Neutro Abs: 2.8 10*3/uL (ref 1.7–7.7)
Platelets: 151 10*3/uL (ref 150–400)
RBC: 2.97 MIL/uL — ABNORMAL LOW (ref 3.87–5.11)
RDW: 17 % — ABNORMAL HIGH (ref 11.5–15.5)
WBC: 4.1 10*3/uL (ref 4.0–10.5)
nRBC: 0.5 % — ABNORMAL HIGH (ref 0.0–0.2)

## 2018-08-18 LAB — BASIC METABOLIC PANEL
Anion gap: 3 — ABNORMAL LOW (ref 5–15)
BUN: 23 mg/dL (ref 8–23)
CO2: 30 mmol/L (ref 22–32)
Calcium: 8.6 mg/dL — ABNORMAL LOW (ref 8.9–10.3)
Chloride: 109 mmol/L (ref 98–111)
Creatinine, Ser: 1.2 mg/dL — ABNORMAL HIGH (ref 0.44–1.00)
GFR calc Af Amer: 55 mL/min — ABNORMAL LOW (ref >60)
GFR calc non Af Amer: 47 mL/min — ABNORMAL LOW (ref >60)
Glucose, Bld: 96 mg/dL (ref 70–99)
Potassium: 6.1 mmol/L — ABNORMAL HIGH (ref 3.5–5.1)
Sodium: 142 mmol/L (ref 135–145)

## 2018-08-18 LAB — URINALYSIS, ROUTINE W REFLEX MICROSCOPIC
Bilirubin Urine: NEGATIVE
Glucose, UA: NEGATIVE mg/dL
Hgb urine dipstick: NEGATIVE
Ketones, ur: NEGATIVE mg/dL
Nitrite: NEGATIVE
Protein, ur: NEGATIVE mg/dL
Specific Gravity, Urine: 1.025 (ref 1.005–1.030)
pH: 5 (ref 5.0–8.0)

## 2018-08-18 LAB — TYPE AND SCREEN
ABO/RH(D): O POS
ANTIBODY SCREEN: NEGATIVE

## 2018-08-18 LAB — POTASSIUM
POTASSIUM: 5.6 mmol/L — AB (ref 3.5–5.1)
Potassium: 5 mmol/L (ref 3.5–5.1)

## 2018-08-18 LAB — BRAIN NATRIURETIC PEPTIDE: B Natriuretic Peptide: 252.1 pg/mL — ABNORMAL HIGH (ref 0.0–100.0)

## 2018-08-18 LAB — GLUCOSE, CAPILLARY: Glucose-Capillary: 86 mg/dL (ref 70–99)

## 2018-08-18 LAB — I-STAT TROPONIN, ED: Troponin i, poc: 0 ng/mL (ref 0.00–0.08)

## 2018-08-18 MED ORDER — ALLOPURINOL 100 MG PO TABS
100.0000 mg | ORAL_TABLET | Freq: Two times a day (BID) | ORAL | Status: DC
  Administered 2018-08-18 – 2018-08-25 (×15): 100 mg via ORAL
  Filled 2018-08-18 (×17): qty 1

## 2018-08-18 MED ORDER — ONDANSETRON HCL 4 MG PO TABS: 4.0000 mg | ORAL_TABLET | Freq: Four times a day (QID) | ORAL | Status: DC | PRN

## 2018-08-18 MED ORDER — COLCHICINE 0.6 MG PO TABS
0.6000 mg | ORAL_TABLET | Freq: Every day | ORAL | Status: DC
  Administered 2018-08-18 – 2018-08-25 (×8): 0.6 mg via ORAL
  Filled 2018-08-18 (×8): qty 1

## 2018-08-18 MED ORDER — DICLOFENAC SODIUM 1 % TD GEL
2.0000 g | Freq: Four times a day (QID) | TRANSDERMAL | Status: DC
  Administered 2018-08-18 – 2018-08-25 (×26): 2 g via TOPICAL
  Filled 2018-08-18: qty 100

## 2018-08-18 MED ORDER — ACETAMINOPHEN 650 MG RE SUPP: 650.0000 mg | Freq: Four times a day (QID) | RECTAL | Status: DC | PRN

## 2018-08-18 MED ORDER — MOMETASONE FURO-FORMOTEROL FUM 200-5 MCG/ACT IN AERO
2.0000 | INHALATION_SPRAY | Freq: Two times a day (BID) | RESPIRATORY_TRACT | Status: DC
  Administered 2018-08-19 – 2018-08-25 (×12): 2 via RESPIRATORY_TRACT
  Filled 2018-08-18 (×2): qty 8.8

## 2018-08-18 MED ORDER — PRAMIPEXOLE DIHYDROCHLORIDE 1 MG PO TABS
1.0000 mg | ORAL_TABLET | Freq: Two times a day (BID) | ORAL | Status: DC
  Administered 2018-08-18 – 2018-08-25 (×15): 1 mg via ORAL
  Filled 2018-08-18 (×17): qty 1

## 2018-08-18 MED ORDER — FUROSEMIDE 10 MG/ML IJ SOLN
40.0000 mg | Freq: Once | INTRAMUSCULAR | Status: AC
  Administered 2018-08-18: 40 mg via INTRAVENOUS
  Filled 2018-08-18: qty 4

## 2018-08-18 MED ORDER — TRAMADOL HCL 50 MG PO TABS
50.0000 mg | ORAL_TABLET | Freq: Four times a day (QID) | ORAL | Status: DC | PRN
  Administered 2018-08-18 – 2018-08-25 (×7): 50 mg via ORAL
  Filled 2018-08-18 (×8): qty 1

## 2018-08-18 MED ORDER — ACETAMINOPHEN 325 MG PO TABS
650.0000 mg | ORAL_TABLET | Freq: Four times a day (QID) | ORAL | Status: DC | PRN
  Administered 2018-08-20 – 2018-08-25 (×11): 650 mg via ORAL
  Filled 2018-08-18 (×11): qty 2

## 2018-08-18 MED ORDER — SACCHAROMYCES BOULARDII 250 MG PO CAPS
250.0000 mg | ORAL_CAPSULE | Freq: Every day | ORAL | Status: DC
  Administered 2018-08-18 – 2018-08-25 (×8): 250 mg via ORAL
  Filled 2018-08-18 (×8): qty 1

## 2018-08-18 MED ORDER — LEVOTHYROXINE SODIUM 50 MCG PO TABS
50.0000 ug | ORAL_TABLET | Freq: Every day | ORAL | Status: DC
  Administered 2018-08-18 – 2018-08-25 (×8): 50 ug via ORAL
  Filled 2018-08-18 (×8): qty 1

## 2018-08-18 MED ORDER — GERHARDT'S BUTT CREAM
1.0000 "application " | TOPICAL_CREAM | Freq: Two times a day (BID) | CUTANEOUS | Status: DC
  Administered 2018-08-18 – 2018-08-25 (×14): 1 via TOPICAL
  Filled 2018-08-18: qty 1

## 2018-08-18 MED ORDER — LOPERAMIDE HCL 2 MG PO CAPS: 2.0000 mg | ORAL_CAPSULE | Freq: Four times a day (QID) | ORAL | Status: DC | PRN

## 2018-08-18 MED ORDER — GUAIFENESIN ER 600 MG PO TB12
1200.0000 mg | ORAL_TABLET | Freq: Two times a day (BID) | ORAL | Status: DC | PRN
  Administered 2018-08-20 – 2018-08-24 (×5): 1200 mg via ORAL
  Filled 2018-08-18 (×5): qty 2

## 2018-08-18 MED ORDER — ENOXAPARIN SODIUM 100 MG/ML ~~LOC~~ SOLN
100.0000 mg | SUBCUTANEOUS | Status: DC
  Filled 2018-08-18: qty 1

## 2018-08-18 MED ORDER — ALBUTEROL SULFATE (2.5 MG/3ML) 0.083% IN NEBU: 3.0000 mL | INHALATION_SOLUTION | Freq: Four times a day (QID) | RESPIRATORY_TRACT | Status: DC | PRN

## 2018-08-18 MED ORDER — GUAIFENESIN ER 600 MG PO TB12: 1200.0000 mg | ORAL_TABLET | Freq: Two times a day (BID) | ORAL | Status: DC

## 2018-08-18 MED ORDER — PANTOPRAZOLE SODIUM 40 MG PO TBEC
40.0000 mg | DELAYED_RELEASE_TABLET | Freq: Every day | ORAL | Status: DC
  Administered 2018-08-18 – 2018-08-25 (×8): 40 mg via ORAL
  Filled 2018-08-18 (×8): qty 1

## 2018-08-18 MED ORDER — FERROUS SULFATE 325 (65 FE) MG PO TABS
325.0000 mg | ORAL_TABLET | Freq: Every day | ORAL | Status: DC
  Administered 2018-08-18 – 2018-08-25 (×8): 325 mg via ORAL
  Filled 2018-08-18 (×8): qty 1

## 2018-08-18 MED ORDER — ALBUTEROL SULFATE (2.5 MG/3ML) 0.083% IN NEBU
5.0000 mg | INHALATION_SOLUTION | Freq: Once | RESPIRATORY_TRACT | Status: AC
  Administered 2018-08-18: 5 mg via RESPIRATORY_TRACT
  Filled 2018-08-18: qty 6

## 2018-08-18 MED ORDER — ONDANSETRON HCL 4 MG/2ML IJ SOLN: 4.0000 mg | Freq: Four times a day (QID) | INTRAMUSCULAR | Status: DC | PRN

## 2018-08-18 MED ORDER — IPRATROPIUM-ALBUTEROL 0.5-2.5 (3) MG/3ML IN SOLN
3.0000 mL | Freq: Four times a day (QID) | RESPIRATORY_TRACT | Status: DC | PRN
  Administered 2018-08-19 – 2018-08-22 (×2): 3 mL via RESPIRATORY_TRACT
  Filled 2018-08-18 (×2): qty 3

## 2018-08-18 MED ORDER — SILVER SULFADIAZINE 1 % EX CREA
1.0000 "application " | TOPICAL_CREAM | Freq: Every day | CUTANEOUS | Status: DC
  Administered 2018-08-18 – 2018-08-25 (×8): 1 via TOPICAL
  Filled 2018-08-18: qty 85

## 2018-08-18 MED ORDER — METOPROLOL TARTRATE 12.5 MG HALF TABLET
12.5000 mg | ORAL_TABLET | Freq: Two times a day (BID) | ORAL | Status: DC
  Administered 2018-08-21: 12.5 mg via ORAL
  Filled 2018-08-18 (×7): qty 1

## 2018-08-18 NOTE — ED Notes (Signed)
Respiratory unable to transport patient upstairs with RN at this time. This RN spoke with RT and they stated they will try to coordinate with another RT so they can assist with transporting the patient to [facility] with RN.

## 2018-08-18 NOTE — ED Notes (Signed)
Pt eating greater than 50 % of breakfast and tolerating well

## 2018-08-18 NOTE — Progress Notes (Signed)
   Subjective: Ms. Orrantia feels she is swollen everywhere. Denies fevers, rhinorrhea, sore throat. Endorses productive cough, but sputum is her normal color. Notes her shortness of breath is worse with lying down. She has not been very active since leaving hospital last visit. Currently not able to perform any ADLs.    Objective:  Vital signs in last 24 hours: Vitals:   08/18/18 0445 08/18/18 0504 08/18/18 0610 08/18/18 0638  BP: (!) 116/48   (!) 101/34  Pulse: (!) 56  (!) 57 61  Resp: 16  12 16   Temp: (!) 95 F (35 C)     TempSrc:      SpO2: 100%  100% 97%  Weight:  (!) 197.3 kg    Height:  5' (1.524 m)     General: awake, alert, lying in bed in NAD, obese HEENT: PEERL; EOMI; moist mucous membranes Neck: no LAD CV: RRR; no murmurs Pulm: no respiratory distress. Speaking in full sentences. Currently on 5L to trach. Lungs are CTA without wheezes or crackles Ext: RLE hematoma with wound vac in place. Non-pitting edema bilaterally. Both legs are erythematous, consistent with stasis dermatitis.  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   Acute on Chronic Hypoxic Respiratory Failure  Pulmonary Eedema 2/2 Acute on Chronic Systolic Heart Failure  OSA/OHS Tracheostomy Dependent - Used to be on spironolactone 100mg  daily and bumex 2mg  daily. These were discontinued after  last hospitalization. She will likely need to resume a diuretic regimen on discharge. - Currently on 5L by trach collar. Home oxygen requirement is 2L by trach collar during the day. - Received 40mg  IV lasix overnight. Diuresed 1.5L.  Plan - Repeat 40mg  IV lasix this afternoon - Continue home metoprolol  - Strict I&O, daily weights - Wean supplemental oxygen to home dose as tolerates - trilogy vent qhs   Cough - Likely 2/2 pulmonary edema in the setting of acute on chronic systolic heart failure. However, she may also have concurrent bronchitis based on productive cough.  Plan - Mucinex bid prn - Duonebs q6hrs prn + albuterol q6hrs prn   Hyperkalemia  - K elevated to 6.1 on admission. Improved to 5.6 after lasix. Plan - Repeat K this afternoon to ensure it is downtrending  Chronic Lymphedema  RLE Hematoma  - Wound vac in place  - Consult wound care  Urine leukocytes on UA  - Patient is asymptomatic. 0-5 WBCs. No concern for UA.  Plan - Discontinue urine culture  COPD: continue home albuterol and duonebs prn, dulera inhaler bid  Gout: continue home colchicine, allopurinol  Hypothyroidism: continue home levothyroxine Chronic pain: continue tramadol 50mg  q6hrs prn RLS: continue home mirapex  Dispo: Anticipated discharge in approximately 2-3 day(s).   Macaiah Mangal, [name], MD 08/18/2018, 6:48 AM Pager: [phone]

## 2018-08-18 NOTE — ED Notes (Signed)
ED TO INPATIENT HANDOFF REPORT  ED Nurse Name and Phone #: Sabino Gasser [phone]  S Name/Age/Gender Rachael Robertson 68 y.o. female Room/Bed: 039C/039C  Code Status   Code Status: Full Code  Home/SNF/Other Home Patient oriented to: self, place, time and situation Is this baseline? Yes   Triage Complete: Triage complete  Chief Complaint Sob  Triage Note Per [facility], Pt from [facility]. Pt report shortness of breath since yesterday. Pt has tracheostomy on 6 L, 94-96%. Pt has hx of CHF. Pt reports she feels as if she is retaining fluid. Pt denies CP   Allergies Allergies  Allergen Reactions  . Other Anaphylaxis    Melons  . Fruit & Vegetable Daily [Nutritional Supplements] Diarrhea    Have to be cooked or vine-ripened  . Lactose Intolerance (Gi) Diarrhea  . Naproxen Other (See Comments)    Makes her "congested"  . Sulfa Antibiotics Hives  . Erythromycin Hives and Rash  . Latex Rash  . Mupirocin Rash  . Tape Rash and Other (See Comments)    Plastic tape (alo) BURNS, but Tegaderm is tolerated    Level of Care/Admitting Diagnosis ED Disposition    ED Disposition Condition Comment   Admit  Hospital Area: [facility] [[id number]]  Level of Care: Progressive [102]  Diagnosis: Acute on chronic respiratory failure with hypoxia [facility]) [[id number]]  Admitting Physician: Levert Feinstein [[id number]]  Attending Physician: Levert Feinstein [[id number]]  Estimated length of stay: 3 - 4 days  Certification:: I certify this patient will need inpatient services for at least 2 midnights  PT Class (Do Not Modify): Inpatient [101]  PT Acc Code (Do Not Modify): Private [1]       B Medical/Surgery History Past Medical History:  Diagnosis Date  . Arthritis   . Asthma   . CHF (congestive heart failure) (HCC)   . COPD (chronic obstructive pulmonary disease) (HCC)   . Obesity   . Opiate use   . Renal disorder   . Tracheostomy in place [facility])    Past Surgical History:   Procedure Laterality Date  . HEMATOMA EVACUATION Right 07/29/2018   Procedure: EVACUATION HEMATOMA OF RIGHT LEG WITH  ACELL AND VAC PLACEMENT;  Surgeon: Peggye Form, DO;  Location: [facility] OR;  Service: Plastics;  Laterality: Right;  . Perianal abscess    . TRACHELECTOMY       A IV Location/Drains/Wounds Patient Lines/Drains/Airways Status   Active Line/Drains/Airways    Name:   Placement date:   Placement time:   Site:   Days:   Peripheral IV 08/18/18 Left;Upper Arm   08/18/18    0219    Arm   less than 1   Negative Pressure Wound Therapy Leg Left;Anterior   07/29/18    1437    -   20   Urethral Catheter Non-latex;Temperature probe 16 Fr.   08/18/18    0445    Non-latex;Temperature probe   less than 1   Incision (Closed) 07/29/18 Leg Right   07/29/18    1517     20   Tracheostomy Shiley 6 mm Uncuffed;Distal   08/18/18    0150    6 mm   less than 1   Wound / Incision (Open or Dehisced) 07/20/18 Other (Comment) Knee Right RIGHT KNEE HEMATOMA; BLEEDING AND BRUISED   07/20/18    1800    Knee   29          Intake/Output Last 24 hours No intake or  output data in the 24 hours ending 08/18/18 1410  Labs/Imaging Results for orders placed or performed during the hospital encounter of 08/18/18 (from the past 48 hour(s))  Basic metabolic panel     Status: Abnormal   Collection Time: 08/18/18  2:20 AM  Result Value Ref Range   Sodium 142 135 - 145 mmol/L   Potassium 6.1 (H) 3.5 - 5.1 mmol/L   Chloride 109 98 - 111 mmol/L   CO2 30 22 - 32 mmol/L   Glucose, Bld 96 70 - 99 mg/dL   BUN 23 8 - 23 mg/dL   Creatinine, Ser 4.09 (H) 0.44 - 1.00 mg/dL   Calcium 8.6 (L) 8.9 - 10.3 mg/dL   GFR calc non Af Amer 47 (L) >60 mL/min   GFR calc Af Amer 55 (L) >60 mL/min   Anion gap 3 (L) 5 - 15    Comment: Performed at [facility], [address] [place], [place] [address]  Brain natriuretic peptide     Status: Abnormal   Collection Time: 08/18/18  2:20 AM  Result Value Ref Range   B  Natriuretic Peptide 252.1 (H) 0.0 - 100.0 pg/mL    Comment: Performed at [facility], [address], [place], [place] [address]  I-stat troponin, ED     Status: None   Collection Time: 08/18/18  2:27 AM  Result Value Ref Range   Troponin i, poc 0.00 0.00 - 0.08 ng/mL   Comment 3            Comment: Due to the release kinetics of cTnI, a negative result within the first hours of the onset of symptoms does not rule out myocardial infarction with certainty. If myocardial infarction is still suspected, repeat the test at appropriate intervals.   CBC with Differential/Platelet     Status: Abnormal   Collection Time: 08/18/18  3:20 AM  Result Value Ref Range   WBC 4.1 4.0 - 10.5 K/uL   RBC 2.97 (L) 3.87 - 5.11 MIL/uL   Hemoglobin 7.9 (L) 12.0 - 15.0 g/dL   HCT 56.2 (L) 13.0 - 86.5 %   MCV 96.0 80.0 - 100.0 fL   MCH 26.6 26.0 - 34.0 pg   MCHC 27.7 (L) 30.0 - 36.0 g/dL   RDW 78.4 (H) 69.6 - 29.5 %   Platelets 151 150 - 400 K/uL   nRBC 0.5 (H) 0.0 - 0.2 %   Neutrophils Relative % 67 %   Neutro Abs 2.8 1.7 - 7.7 K/uL   Lymphocytes Relative 20 %   Lymphs Abs 0.8 0.7 - 4.0 K/uL   Monocytes Relative 7 %   Monocytes Absolute 0.3 0.1 - 1.0 K/uL   Eosinophils Relative 3 %   Eosinophils Absolute 0.1 0.0 - 0.5 K/uL   Basophils Relative 1 %   Basophils Absolute 0.0 0.0 - 0.1 K/uL   Immature Granulocytes 2 %   Abs Immature Granulocytes 0.06 0.00 - 0.07 K/uL    Comment: Performed at [facility], [address] [place], [place] [address]  Potassium     Status: Abnormal   Collection Time: 08/18/18  4:33 AM  Result Value Ref Range   Potassium 5.6 (H) 3.5 - 5.1 mmol/L    Comment: Performed at [facility], [address], [place], [place] [address]  Type and screen     Status: None   Collection Time: 08/18/18  4:35 AM  Result Value Ref Range   ABO/RH(D) [place]  POS    Antibody Screen NEG    Sample Expiration      08/21/2018 Performed at [facility], [address] [place], [place] [address]   Urinalysis, Routine w reflex microscopic     Status: Abnormal   Collection Time: 08/18/18  4:59 AM  Result Value Ref Range   Color, Urine YELLOW YELLOW   APPearance HAZY (A) CLEAR   Specific Gravity, Urine 1.025 1.005 - 1.030   pH 5.0 5.0 - 8.0   Glucose, UA NEGATIVE NEGATIVE mg/dL   Hgb urine dipstick NEGATIVE NEGATIVE   Bilirubin Urine NEGATIVE NEGATIVE   Ketones, ur NEGATIVE NEGATIVE mg/dL   Protein, ur NEGATIVE NEGATIVE mg/dL   Nitrite NEGATIVE NEGATIVE   Leukocytes,Ua SMALL (A) NEGATIVE   RBC / HPF 0-5 0 - 5 RBC/hpf   WBC, UA 0-5 0 - 5 WBC/hpf   Bacteria, UA MANY (A) NONE SEEN   Squamous Epithelial / LPF 0-5 0 - 5   Mucus PRESENT    Hyaline Casts, UA PRESENT     Comment: Performed at [facility], [address], [place], [place] [address]  Potassium     Status: None   Collection Time: 08/18/18 12:31 PM  Result Value Ref Range   Potassium 5.0 3.5 - 5.1 mmol/L    Comment: Performed at [facility], [address], [place], [place] [address]   Dg Chest Portable 1 View  Result Date: 08/18/2018 CLINICAL DATA:  Shortness of breath EXAM: PORTABLE CHEST 1 VIEW COMPARISON:  07/19/2018 FINDINGS: Tracheostomy tube is in place. Cardiomegaly with vascular congestion and hazy perihilar edema. Aortic atherosclerosis. No pneumothorax. IMPRESSION: Cardiomegaly with vascular congestion and mild perihilar edema. Enlarged appearing central pulmonary vessels suggesting arterial hypertension. Electronically Signed   By: Jasmine Pang M.D.   On: 08/18/2018 02:01    Pending Labs Unresulted Labs (From admission, onward)    Start     Ordered   08/19/18 0500  Basic metabolic panel  Tomorrow morning,   R     08/18/18 0518   08/18/18 0200  CBC with Differential/Platelet  Once,   R     08/18/18 0159          Vitals/Pain Today's Vitals   08/18/18 1150 08/18/18 1200 08/18/18 1330 08/18/18 1340  BP:  116/90 (!) 103/41   Pulse: (!) 57 60 (!) 56   Resp:  18 18 15 16   Temp:      TempSrc:      SpO2:  100% 100% 96%  Weight:      Height:      PainSc:        Isolation Precautions No active isolations  Medications Medications  allopurinol (ZYLOPRIM) tablet 100 mg (100 mg Oral Given 08/18/18 1143)  colchicine tablet 0.6 mg (0.6 mg Oral Given 08/18/18 0915)  traMADol (ULTRAM) tablet 50 mg (50 mg Oral Given 08/18/18 0644)  metoprolol tartrate (LOPRESSOR) tablet 12.5 mg (12.5 mg Oral Not Given 08/18/18 1140)  levothyroxine (SYNTHROID, LEVOTHROID) tablet 50 mcg (50 mcg Oral Given 08/18/18 0644)  loperamide (IMODIUM) capsule 2 mg (has no administration in time range)  pantoprazole (PROTONIX) EC tablet 40 mg (40 mg Oral Given 08/18/18 1147)  saccharomyces boulardii (FLORASTOR) capsule 250 mg (250 mg Oral Given 08/18/18 1142)  ferrous sulfate tablet 325 mg (325 mg Oral Given 08/18/18 0915)  pramipexole (MIRAPEX) tablet 1 mg (1 mg Oral Given 08/18/18 1141)  albuterol (PROVENTIL) (2.5 MG/3ML) 0.083% nebulizer solution 3 mL (has  no administration in time range)  mometasone-formoterol (DULERA) 200-5 MCG/ACT inhaler 2 puff (2 puffs Inhalation Not Given 08/18/18 1000)  ipratropium-albuterol (DUONEB) 0.5-2.5 (3) MG/3ML nebulizer solution 3 mL (has no administration in time range)  diclofenac sodium (VOLTAREN) 1 % transdermal gel 2 g (2 g Topical Given 08/18/18 1206)  Gerhardt's butt cream 1 application (1 application Topical Not Given 08/18/18 1209)  silver sulfADIAZINE (SILVADENE) 1 % cream 1 application (1 application Topical Given 08/18/18 1148)  acetaminophen (TYLENOL) tablet 650 mg (has no administration in time range)    Or  acetaminophen (TYLENOL) suppository 650 mg (has no administration in time range)  ondansetron (ZOFRAN) tablet 4 mg (has no administration in time range)    Or  ondansetron (ZOFRAN) injection 4 mg (has no administration in time range)  enoxaparin (LOVENOX) injection 100 mg (has no administration in time range)  guaiFENesin (MUCINEX) 12  hr tablet 1,200 mg (has no administration in time range)  furosemide (LASIX) injection 40 mg (has no administration in time range)  albuterol (PROVENTIL) (2.5 MG/3ML) 0.083% nebulizer solution 5 mg (5 mg Nebulization Given 08/18/18 0151)  furosemide (LASIX) injection 40 mg (40 mg Intravenous Given 08/18/18 0504)    Mobility non-ambulatory High fall risk   Focused Assessments Pulmonary Assessment Handoff:  Lung sounds: Bilateral Breath Sounds: Diminished L Breath Sounds: Fine crackles R Breath Sounds: Fine crackles O2 Device: Tracheostomy Collar O2 Flow Rate (L/min): 5 L/min      R Recommendations: See Admitting Provider Note  Report given to:   Additional Notes:

## 2018-08-18 NOTE — H&P (Signed)
Date: 08/18/2018               Patient Name:  Rachael Robertson MRN: [id number]  DOB: 1951-04-21 Age / Sex: 67 y.o., female   PCP: Lorenso Courier, MD         Medical Service: Internal Medicine Teaching Service         Attending Physician: Dr. Levert Feinstein, MD    First Contact: Dr. Avie Arenas Pager: [phone]  Second Contact: Dr. 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 She tells [place] that she is short of breath and her water weight is increased. Her home oxygen saturation is fine but she feels very short of breath. Since discharge in late February she has not received her diuretics. She thinks they were held due to worsening renal function. She also has noticed her legs are 3x larger than usual. She has felt this way in the past whenever her diuretics are held. The whole process has been going on for the last 3 days and seemed to get bad today. She woke up from a nap feeling as though she could not breath and that her trach was blocked. She subsequently pressed her life alert button and called her nurse at [facility]. She has tired several rounds of breathing treatments without relief.   She has noticed that she has to suction her trach more than usual (every 2 hours), increased cough, chronic orthopnea, and nausea.  She denies fevers, chills, headaches, change in sputum consistency or color, chest pain, abdominal pain, vomiting, diarrhea, constipation, dysuria, polyuria, myalgias, arthralgias, increased fluid intake. She has unfortunately been unable to regulate her sodium intake  given she is in a facility.   Meds:  Current Meds  Medication Sig  . acetaminophen (TYLENOL) 650 MG CR tablet Take 1,300 mg by mouth 2 (two) times daily.  Marland Kitchen albuterol (PROVENTIL HFA;VENTOLIN HFA) 108 (90 Base) MCG/ACT inhaler Inhale 1-2 puffs into the lungs every 6 (six) hours as needed for wheezing or shortness of breath.  . allopurinol (ZYLOPRIM) 100 MG tablet Take 100 mg by mouth 2 (two) times daily.  . budesonide-formoterol (SYMBICORT) 160-4.5 MCG/ACT inhaler Inhale 2 puffs into the lungs 2 (two) times daily.  . colchicine 0.6 MG tablet Take 1 tablet (0.6 mg total) by mouth daily.  . diclofenac sodium (VOLTAREN) 1 % GEL Apply 2 g topically 4 (four) times daily. (Patient taking differently: Apply 2-4 g topically See admin instructions. Apply 2-4 grams topically to knees two times a day)  . ferrous sulfate 325 (65 FE) MG tablet Take 1 tablet (325 mg total) by mouth daily with breakfast.  . fluconazole (DIFLUCAN) 150 MG tablet Take 1 tablet (150 mg total) by mouth once a week. (Patient taking differently: Take 150 mg by mouth every Wednesday. )  . guaiFENesin (MUCINEX) 600 MG 12 hr tablet Take 1,200 mg by mouth every 12 (twelve) hours.  . Hydrocortisone (GERHARDT'S BUTT CREAM) CREA Apply 1 application topically 2 (two) times daily.  Marland Kitchen ipratropium-albuterol (DUONEB) 0.5-2.5 (3) MG/3ML SOLN Take 3 mLs by nebulization every  6 (six) hours as needed (sob/wheezing).  Marland Kitchen levothyroxine (SYNTHROID, LEVOTHROID) 50 MCG tablet Take 50 mcg by mouth daily before breakfast.  . loperamide (IMODIUM A-D) 2 MG tablet Take 2 mg by mouth 4 (four) times daily as needed for diarrhea or loose stools.  . metoprolol tartrate (LOPRESSOR) 25 MG tablet Take 0.5 tablets (12.5 mg total) by mouth 2 (two) times daily. On when her systolic BP is >100  . omeprazole (PRILOSEC) 20 MG capsule Take 20 mg by mouth daily.  . potassium chloride SA (K-DUR,KLOR-CON) 20 MEQ tablet Take 40 mEq by mouth daily.   . pramipexole (MIRAPEX) 1 MG  tablet Take 1 mg by mouth 2 (two) times daily.  . Probiotic Product (DAILY PROBIOTIC) CAPS Take 1 tablet by mouth daily.  . silver sulfADIAZINE (SILVADENE) 1 % cream Apply topically daily. (Patient taking differently: Apply 1 application topically daily. )  . traMADol (ULTRAM) 50 MG tablet Take 1 tablet (50 mg total) by mouth every 6 (six) hours as needed for moderate pain.   Allergies: Allergies as of 08/18/2018 - Review Complete 08/18/2018  Allergen Reaction Noted  . Other Anaphylaxis 07/19/2018  . Fruit & vegetable daily [nutritional supplements] Diarrhea 07/19/2018  . Lactose intolerance (gi) Diarrhea 07/19/2018  . Naproxen Other (See Comments) 05/03/2018  . Sulfa antibiotics Hives 05/03/2018  . Erythromycin Hives and Rash 05/03/2018  . Latex Rash 07/19/2018  . Mupirocin Rash 07/19/2018  . Tape Rash and Other (See Comments) 07/19/2018   Past Medical History:  Diagnosis Date  . Arthritis   . Asthma   . CHF (congestive heart failure) (HCC)   . COPD (chronic obstructive pulmonary disease) (HCC)   . Obesity   . Opiate use   . Renal disorder   . Tracheostomy in place [facility])    Family History:  Family History  Problem Relation Age of Onset  . Aneurysm Mother   . Heart disease Father   . Skin cancer Father   . Sleep apnea Father        TRACHEOSTOMY AS WELL  . Breast cancer Sister    Social History:  Social History   Tobacco Use  . Smoking status: Passive Smoke Exposure - Never Smoker  . Smokeless tobacco: Never Used  Substance Use Topics  . Alcohol use: Yes    Comment: 1-2 per year   . Drug use: Not Currently   Review of Systems: A complete ROS was negative except as per HPI.   Physical Exam: Blood pressure (!) 126/112, pulse 60, temperature 98 F (36.7 C), temperature source Oral, resp. rate 20, SpO2 92 %.  General: Severely morbidly obese female in no acute distress HENT: Normocephalic, atraumatic, moist mucus membranes, trach site clean without surrounding  erythema or discharge  Pulm: Good air movement with no wheezing or crackles  CV: RRR, no murmurs, no rubs  Abdomen: Active bowel sounds, soft, non-distended, tenderness to palpation of the right side Extremities: Pulses palpable in all extremities, wound vac in place on the right shin, large lower extremities, difficult to assess for peripheral edema given body habitus  Skin: Warm and dry  Neuro: Alert and oriented x 3  Labs: CBC: chronic normocytic anemia hgb 7.9 BMP: K 6.1, 5.6 on recheck, Creatine 1.20 (at baseline)  BNP 252 Trop 0.00  EKG: personally reviewed my interpretation is NSR with RBBB, similar to prior. 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   Acute on Chronic Hypoxic Respiratory Failure  Pulmonary Eedema 2/2 Acute on Chronic Systolic Heart Failure  - presented with cough, sob, increased leg swelling, elevated BNP, and pulmonary edema on CXR - likely 2/2 discontinuation of diuretics (bumex , spironolactone ) after Feb 17th hospitalization. Per chart review, spironolactone was discontinued due to hypotension and bumex was held prior to receiving a contrast CT study. Unclear why bumex was not restarted on discharge because renal function did return to normal.  - echo in Nov 2019 EF 45-50%, g1dd, LVH - IV lasix , assess diuresis and redose as needed  - continue home  metoprolol  - strict I&O, daily weight - mucinex bid  OSA/OHS Tracheostomy Dependent - trilogy vent qhs   History of PE previously on Xarelto  - Discontinued during last hospitalization due to RLE hematoma   Chronic Lymphedema  RLE Hematoma  - Wound vac in place  - Consult wound care  COPD: continue home albuterol and duonebs prn, dulera inhaler bid  Gout: continue home colchicine, allopurinol  Hypothyroidism: continue home levothyroxine  Diet: Heart Healthy  VTE ppx: Lovenox  CODE STATUS: Full Code  Dispo: Admit patient to Inpatient with expected length of stay greater than 2 midnights.  Signed: Levora Dredge, MD 08/18/2018, 5:22 AM  Pager: [phone]

## 2018-08-18 NOTE — ED Notes (Signed)
ED TO INPATIENT HANDOFF REPORT  ED Nurse Name and Phone #:  Sabino Gasser [phone]  S Name/Age/Gender Rachael Robertson 68 y.o. female Room/Bed: 039C/039C  Code Status   Code Status: Full Code  Home/SNF/Other Rehab Patient oriented to: self, place, time and situation Is this baseline? Yes   Triage Complete: Triage complete  Chief Complaint Sob  Triage Note Per [facility], Pt from [facility]. Pt report shortness of breath since yesterday. Pt has tracheostomy on 6 L, 94-96%. Pt has hx of CHF. Pt reports she feels as if she is retaining fluid. Pt denies CP   Allergies Allergies  Allergen Reactions  . Other Anaphylaxis    Melons  . Fruit & Vegetable Daily [Nutritional Supplements] Diarrhea    Have to be cooked or vine-ripened  . Lactose Intolerance (Gi) Diarrhea  . Naproxen Other (See Comments)    Makes her "congested"  . Sulfa Antibiotics Hives  . Erythromycin Hives and Rash  . Latex Rash  . Mupirocin Rash  . Tape Rash and Other (See Comments)    Plastic tape (alo) BURNS, but Tegaderm is tolerated    Level of Care/Admitting Diagnosis ED Disposition    ED Disposition Condition Comment   Admit  Hospital Area: [facility] [[id number]]  Level of Care: Progressive [102]  Diagnosis: Acute on chronic respiratory failure with hypoxia [facility]) [[id number]]  Admitting Physician: Levert Feinstein [[id number]]  Attending Physician: Levert Feinstein [[id number]]  Estimated length of stay: 3 - 4 days  Certification:: I certify this patient will need inpatient services for at least 2 midnights  PT Class (Do Not Modify): Inpatient [101]  PT Acc Code (Do Not Modify): Private [1]       B Medical/Surgery History Past Medical History:  Diagnosis Date  . Arthritis   . Asthma   . CHF (congestive heart failure) (HCC)   . COPD (chronic obstructive pulmonary disease) (HCC)   . Obesity   . Opiate use   . Renal disorder   . Tracheostomy in place [facility])    Past Surgical History:   Procedure Laterality Date  . HEMATOMA EVACUATION Right 07/29/2018   Procedure: EVACUATION HEMATOMA OF RIGHT LEG WITH  ACELL AND VAC PLACEMENT;  Surgeon: Peggye Form, DO;  Location: [facility] OR;  Service: Plastics;  Laterality: Right;  . Perianal abscess    . TRACHELECTOMY       A IV Location/Drains/Wounds Patient Lines/Drains/Airways Status   Active Line/Drains/Airways    Name:   Placement date:   Placement time:   Site:   Days:   Peripheral IV 08/18/18 Left;Upper Arm   08/18/18    0219    Arm   less than 1   Negative Pressure Wound Therapy Leg Left;Anterior   07/29/18    1437    -   20   Urethral Catheter Non-latex;Temperature probe 16 Fr.   08/18/18    0445    Non-latex;Temperature probe   less than 1   Incision (Closed) 07/29/18 Leg Right   07/29/18    1517     20   Tracheostomy Shiley 6 mm Uncuffed;Distal   08/18/18    0150    6 mm   less than 1   Wound / Incision (Open or Dehisced) 07/20/18 Other (Comment) Knee Right RIGHT KNEE HEMATOMA; BLEEDING AND BRUISED   07/20/18    1800    Knee   29          Intake/Output Last 24 hours  Intake/Output  Summary (Last 24 hours) at 08/18/2018 1800 Last data filed at 08/18/2018 1415 Gross per 24 hour  Intake -  Output 2400 ml  Net -2400 ml    Labs/Imaging Results for orders placed or performed during the hospital encounter of 08/18/18 (from the past 48 hour(s))  Basic metabolic panel     Status: Abnormal   Collection Time: 08/18/18  2:20 AM  Result Value Ref Range   Sodium 142 135 - 145 mmol/L   Potassium 6.1 (H) 3.5 - 5.1 mmol/L   Chloride 109 98 - 111 mmol/L   CO2 30 22 - 32 mmol/L   Glucose, Bld 96 70 - 99 mg/dL   BUN 23 8 - 23 mg/dL   Creatinine, Ser 4.56 (H) 0.44 - 1.00 mg/dL   Calcium 8.6 (L) 8.9 - 10.3 mg/dL   GFR calc non Af Amer 47 (L) >60 mL/min   GFR calc Af Amer 55 (L) >60 mL/min   Anion gap 3 (L) 5 - 15    Comment: Performed at [facility], [address], [place], [place] [address]  Brain natriuretic  peptide     Status: Abnormal   Collection Time: 08/18/18  2:20 AM  Result Value Ref Range   B Natriuretic Peptide 252.1 (H) 0.0 - 100.0 pg/mL    Comment: Performed at [facility], [address], [place], [place] [address]  I-stat troponin, ED     Status: None   Collection Time: 08/18/18  2:27 AM  Result Value Ref Range   Troponin i, poc 0.00 0.00 - 0.08 ng/mL   Comment 3            Comment: Due to the release kinetics of cTnI, a negative result within the first hours of the onset of symptoms does not rule out myocardial infarction with certainty. If myocardial infarction is still suspected, repeat the test at appropriate intervals.   CBC with Differential/Platelet     Status: Abnormal   Collection Time: 08/18/18  3:20 AM  Result Value Ref Range   WBC 4.1 4.0 - 10.5 K/uL   RBC 2.97 (L) 3.87 - 5.11 MIL/uL   Hemoglobin 7.9 (L) 12.0 - 15.0 g/dL   HCT 28.7 (L) 68.1 - 15.7 %   MCV 96.0 80.0 - 100.0 fL   MCH 26.6 26.0 - 34.0 pg   MCHC 27.7 (L) 30.0 - 36.0 g/dL   RDW 26.2 (H) 03.5 - 59.7 %   Platelets 151 150 - 400 K/uL   nRBC 0.5 (H) 0.0 - 0.2 %   Neutrophils Relative % 67 %   Neutro Abs 2.8 1.7 - 7.7 K/uL   Lymphocytes Relative 20 %   Lymphs Abs 0.8 0.7 - 4.0 K/uL   Monocytes Relative 7 %   Monocytes Absolute 0.3 0.1 - 1.0 K/uL   Eosinophils Relative 3 %   Eosinophils Absolute 0.1 0.0 - 0.5 K/uL   Basophils Relative 1 %   Basophils Absolute 0.0 0.0 - 0.1 K/uL   Immature Granulocytes 2 %   Abs Immature Granulocytes 0.06 0.00 - 0.07 K/uL    Comment: Performed at [facility], [address] [place], [place] [address]  Potassium     Status: Abnormal   Collection Time: 08/18/18  4:33 AM  Result Value Ref Range   Potassium 5.6 (H) 3.5 - 5.1 mmol/L    Comment: Performed at [facility], [address] [place], [place] [address]  Type and screen  Status: None   Collection Time: 08/18/18  4:35 AM  Result Value Ref Range   ABO/RH(D) O POS    Antibody  Screen NEG    Sample Expiration      08/21/2018 Performed at [facility], [address], [place], [place] [address]   Urinalysis, Routine w reflex microscopic     Status: Abnormal   Collection Time: 08/18/18  4:59 AM  Result Value Ref Range   Color, Urine YELLOW YELLOW   APPearance HAZY (A) CLEAR   Specific Gravity, Urine 1.025 1.005 - 1.030   pH 5.0 5.0 - 8.0   Glucose, UA NEGATIVE NEGATIVE mg/dL   Hgb urine dipstick NEGATIVE NEGATIVE   Bilirubin Urine NEGATIVE NEGATIVE   Ketones, ur NEGATIVE NEGATIVE mg/dL   Protein, ur NEGATIVE NEGATIVE mg/dL   Nitrite NEGATIVE NEGATIVE   Leukocytes,Ua SMALL (A) NEGATIVE   RBC / HPF 0-5 0 - 5 RBC/hpf   WBC, UA 0-5 0 - 5 WBC/hpf   Bacteria, UA MANY (A) NONE SEEN   Squamous Epithelial / LPF 0-5 0 - 5   Mucus PRESENT    Hyaline Casts, UA PRESENT     Comment: Performed at [facility], [address], [place], [place] [address]  Potassium     Status: None   Collection Time: 08/18/18 12:31 PM  Result Value Ref Range   Potassium 5.0 3.5 - 5.1 mmol/L    Comment: Performed at [facility], [address], [place], [place] [address]   Dg Chest Portable 1 View  Result Date: 08/18/2018 CLINICAL DATA:  Shortness of breath EXAM: PORTABLE CHEST 1 VIEW COMPARISON:  07/19/2018 FINDINGS: Tracheostomy tube is in place. Cardiomegaly with vascular congestion and hazy perihilar edema. Aortic atherosclerosis. No pneumothorax. IMPRESSION: Cardiomegaly with vascular congestion and mild perihilar edema. Enlarged appearing central pulmonary vessels suggesting arterial hypertension. Electronically Signed   By: Jasmine Pang M.D.   On: 08/18/2018 02:01    Pending Labs Unresulted Labs (From admission, onward)    Start     Ordered   08/19/18 0500  Basic metabolic panel  Tomorrow morning,   R     08/18/18 0518   08/18/18 0200  CBC with Differential/Platelet  Once,   R     08/18/18 0159          Vitals/Pain Today's Vitals   08/18/18 1500  08/18/18 1530 08/18/18 1600 08/18/18 1615  BP: (!) 117/105 (!) 121/51 (!) 110/56 115/61  Pulse: (!) 56 69 (!) 50 (!) 58  Resp: Temp:      TempSrc:      SpO2: 94% 96% 100% 100%  Weight:      Height:      PainSc:        Isolation Precautions No active isolations  Medications Medications  allopurinol (ZYLOPRIM) tablet 100 mg (100 mg Oral Given 08/18/18 1143)  colchicine tablet 0.6 mg (0.6 mg Oral Given 08/18/18 0915)  traMADol (ULTRAM) tablet 50 mg (50 mg Oral Given 08/18/18 0644)  metoprolol tartrate (LOPRESSOR) tablet 12.5 mg (12.5 mg Oral Not Given 08/18/18 1140)  levothyroxine (SYNTHROID, LEVOTHROID) tablet 50 mcg (50 mcg Oral Given 08/18/18 0644)  loperamide (IMODIUM) capsule 2 mg (has no administration in time range)  pantoprazole (PROTONIX) EC tablet 40 mg (40 mg Oral Given 08/18/18 1147)  saccharomyces boulardii (FLORASTOR) capsule 250 mg (250 mg Oral Given 08/18/18 1142)  ferrous sulfate tablet 325 mg (325 mg Oral Given 08/18/18 0915)  pramipexole (MIRAPEX) tablet 1 mg (1 mg Oral Given 08/18/18 1141)  albuterol (PROVENTIL) (2.5 MG/3ML) 0.083% nebulizer solution 3 mL (has no administration in time range)  mometasone-formoterol (DULERA) 200-5 MCG/ACT inhaler 2 puff (2 puffs Inhalation Not Given 08/18/18 1000)  ipratropium-albuterol (DUONEB) 0.5-2.5 (3) MG/3ML nebulizer solution 3 mL (has no administration in time range)  diclofenac sodium (VOLTAREN) 1 % transdermal gel 2 g (2 g Topical Given 08/18/18 1206)  Gerhardt's butt cream 1 application (1 application Topical Not Given 08/18/18 1209)  silver sulfADIAZINE (SILVADENE) 1 % cream 1 application (1 application Topical Given 08/18/18 1148)  acetaminophen (TYLENOL) tablet 650 mg (has no administration in time range)    Or  acetaminophen (TYLENOL) suppository 650 mg (has no administration in time range)  ondansetron (ZOFRAN) tablet 4 mg (has no administration in time range)    Or  ondansetron (ZOFRAN) injection 4 mg (has no  administration in time range)  enoxaparin (LOVENOX) injection 100 mg (has no administration in time range)  guaiFENesin (MUCINEX) 12 hr tablet 1,200 mg (has no administration in time range)  albuterol (PROVENTIL) (2.5 MG/3ML) 0.083% nebulizer solution 5 mg (5 mg Nebulization Given 08/18/18 0151)  furosemide (LASIX) injection 40 mg (40 mg Intravenous Given 08/18/18 0504)  furosemide (LASIX) injection 40 mg (40 mg Intravenous Given 08/18/18 1424)    Mobility non-ambulatory High fall risk   Focused Assessments Pulmonary Assessment Handoff:  Lung sounds: Bilateral Breath Sounds: Diminished L Breath Sounds: Fine crackles R Breath Sounds: Fine crackles O2 Device: Bi-PAP(AVaps) O2 Flow Rate (L/min): 5 L/min      R Recommendations: See Admitting Provider Note  Report given to:   Additional Notes:

## 2018-08-18 NOTE — ED Notes (Signed)
Patient transferred on a bariatric bed.

## 2018-08-18 NOTE — ED Triage Notes (Signed)
Per [facility], Pt from [facility]. Pt report shortness of breath since yesterday. Pt has tracheostomy on 6 L, 94-96%. Pt has hx of CHF. Pt reports she feels as if she is retaining fluid. Pt denies CP

## 2018-08-18 NOTE — Progress Notes (Signed)
Patient taken off AVAPS and placed back on 28% ATC so patient could eat. Vitals stable. RN at bedside.

## 2018-08-18 NOTE — ED Notes (Signed)
Lunch tray ordered 

## 2018-08-18 NOTE — ED Provider Notes (Signed)
[facility] EMERGENCY DEPARTMENT Provider Note   CSN: [id number] Arrival date & time: 08/18/18  0122    History   Chief Complaint Chief Complaint  Patient presents with  . Shortness of Breath    HPI Rachael Robertson is a 68 y.o. female.     The history is provided by the patient and the EMS personnel.  Shortness of Breath  Severity:  Severe Onset quality:  Sudden Timing:  Constant Progression:  Unchanged Chronicity:  New Context: not URI   Relieved by:  Nothing Worsened by:  Nothing Ineffective treatments:  None tried Associated symptoms: wheezing   Associated symptoms: no abdominal pain, no chest pain, no fever, no neck pain, no sputum production and no syncope   Risk factors: obesity   Patient with morbid obesity, tracheostomy and a wound vac presents with sudden onset SOB and Also new LE edema.    Past Medical History:  Diagnosis Date  . Arthritis   . Asthma   . CHF (congestive heart failure) (HCC)   . COPD (chronic obstructive pulmonary disease) (HCC)   . Obesity   . Opiate use   . Renal disorder   . Tracheostomy in place [facility])     Patient Active Problem List   Diagnosis Date Noted  . Lymphedema of both lower extremities 08/10/2018  . Leg wound, right 08/10/2018  . Subcutaneous hematoma 07/26/2018  . Acute blood loss anemia 07/26/2018  . Hematuria 07/26/2018  . Tracheostomy complication (HCC)   . Right Lower Lobe PNU  05/28/2018  . Chronic systolic heart failure (HCC) 05/20/2018  . Arthritis 05/20/2018  . Other chronic pain 05/20/2018  . Lethargy   . Hemoptysis   . OSA (obstructive sleep apnea)   . Tracheostomy status (HCC)   . Hypoxemia   . Chronic respiratory failure with hypoxia (HCC) 05/07/2018    Past Surgical History:  Procedure Laterality Date  . HEMATOMA EVACUATION Right 07/29/2018   Procedure: EVACUATION HEMATOMA OF RIGHT LEG WITH  ACELL AND VAC PLACEMENT;  Surgeon: Peggye Form, DO;  Location: [facility] OR;  Service:  Plastics;  Laterality: Right;  . Perianal abscess    . TRACHELECTOMY       OB History   No obstetric history on file.      Home Medications    Prior to Admission medications   Medication Sig Start Date End Date Taking? Authorizing Provider  acetaminophen (TYLENOL) 650 MG CR tablet Take 1,300 mg by mouth 2 (two) times daily.   Yes [provider]  albuterol (PROVENTIL HFA;VENTOLIN HFA) 108 (90 Base) MCG/ACT inhaler Inhale 1-2 puffs into the lungs every 6 (six) hours as needed for wheezing or shortness of breath.   Yes [provider]  allopurinol (ZYLOPRIM) 100 MG tablet Take 100 mg by mouth 2 (two) times daily.   Yes [provider]  budesonide-formoterol (SYMBICORT) 160-4.5 MCG/ACT inhaler Inhale 2 puffs into the lungs 2 (two) times daily.   Yes [provider]  colchicine 0.6 MG tablet Take 1 tablet (0.6 mg total) by mouth daily. 08/03/18  Yes [name], MD  diclofenac sodium (VOLTAREN) 1 % GEL Apply 2 g topically 4 (four) times daily. Patient taking differently: Apply 2-4 g topically See admin instructions. Apply 2-4 grams topically to knees two times a day 05/20/18  Yes Rehman, Areeg N, DO  ferrous sulfate 325 (65 FE) MG tablet Take 1 tablet (325 mg total) by mouth daily with breakfast. 07/22/18 07/22/19 Yes Dorrell, [name], MD  fluconazole (DIFLUCAN) 150 MG tablet Take 1 tablet (150 mg total) by mouth once a week. Patient taking differently: Take 150 mg by mouth every Wednesday.  07/27/18  Yes Dorrell, [name], MD  guaiFENesin (MUCINEX) 600 MG 12 hr tablet Take 1,200 mg by mouth every 12 (twelve) hours.   Yes [provider]  Hydrocortisone (GERHARDT'S BUTT CREAM) CREA Apply 1 application topically 2 (two) times daily. 07/22/18  Yes Dorrell, [name], MD  ipratropium-albuterol (DUONEB) 0.5-2.5 (3) MG/3ML SOLN Take 3 mLs by nebulization every 6 (six) hours as needed (sob/wheezing).   Yes [provider]  levothyroxine  (SYNTHROID, LEVOTHROID) 50 MCG tablet Take 50 mcg by mouth daily before breakfast.   Yes [provider]  loperamide (IMODIUM A-D) 2 MG tablet Take 2 mg by mouth 4 (four) times daily as needed for diarrhea or loose stools.   Yes [provider]  metoprolol tartrate (LOPRESSOR) 25 MG tablet Take 0.5 tablets (12.5 mg total) by mouth 2 (two) times daily. On when her systolic BP is >100 08/03/18 08/03/19 Yes [name], MD  omeprazole (PRILOSEC) 20 MG capsule Take 20 mg by mouth daily.   Yes [provider]  potassium chloride SA (K-DUR,KLOR-CON) 20 MEQ tablet Take 40 mEq by mouth daily.    Yes [provider]  pramipexole (MIRAPEX) 1 MG tablet Take 1 mg by mouth 2 (two) times daily.   Yes [provider]  Probiotic Product (DAILY PROBIOTIC) CAPS Take 1 tablet by mouth daily. 08/04/18  Yes [name], MD  silver sulfADIAZINE (SILVADENE) 1 % cream Apply topically daily. Patient taking differently: Apply 1 application topically daily.  07/06/18  Yes Chundi, Vahini, MD  traMADol (ULTRAM) 50 MG tablet Take 1 tablet (50 mg total) by mouth every 6 (six) hours as needed for moderate pain. 07/09/18  Yes Fayrene Helper, PA-C    Family History Family History  Problem Relation Age of Onset  . Aneurysm Mother   . Heart disease Father   . Skin cancer Father   . Sleep apnea Father        TRACHEOSTOMY AS WELL  . Breast cancer Sister     Social History Social History   Tobacco Use  . Smoking status: Passive Smoke Exposure - Never Smoker  . Smokeless tobacco: Never Used  Substance Use Topics  . Alcohol use: Yes    Comment: 1-2 per year   . Drug use: Not Currently     Allergies   Other; Fruit & vegetable daily [nutritional supplements]; Lactose intolerance (gi); Naproxen; Sulfa antibiotics; Erythromycin; Latex; Mupirocin; and Tape   Review of Systems Review of Systems  Constitutional: Negative for fever.  Respiratory: Positive for shortness of  breath and wheezing. Negative for sputum production.   Cardiovascular: Positive for leg swelling. Negative for chest pain, palpitations and syncope.  Gastrointestinal: Negative for abdominal pain.  Musculoskeletal: Negative for neck pain.  All other systems reviewed and are negative.    Physical Exam Updated Vital Signs BP (!) 126/112   Pulse 60   Temp 98 F (36.7 C) (Oral)   Resp 20   SpO2 92%   Physical Exam Vitals signs and nursing note reviewed.  Constitutional:      General: She is not in acute distress.    Appearance: She is obese.  HENT:     Head: Normocephalic and atraumatic.     Nose: Nose normal.     Mouth/Throat:     Mouth: Mucous membranes are moist.  Pharynx: Oropharynx is clear.  Eyes:     Conjunctiva/sclera: Conjunctivae normal.     Pupils: Pupils are equal, round, and reactive to light.  Neck:     Musculoskeletal: Normal range of motion and neck supple.  Cardiovascular:     Rate and Rhythm: Normal rate and regular rhythm.     Pulses: Normal pulses.     Heart sounds: Normal heart sounds.  Pulmonary:     Breath sounds: No stridor. Wheezing present.  Abdominal:     General: Bowel sounds are normal.     Palpations: Abdomen is soft.     Tenderness: There is no abdominal tenderness. There is no guarding.  Musculoskeletal:     Right lower leg: Edema present.     Left lower leg: Edema present.     Comments: Wound vac of the RLE  Skin:    General: Skin is warm and dry.     Capillary Refill: Capillary refill takes less than 2 seconds.  Neurological:     General: No focal deficit present.     Mental Status: She is alert and oriented to person, place, and time.  Psychiatric:        Mood and Affect: Mood normal.        Behavior: Behavior normal.      ED Treatments / Results  Labs (all labs ordered are listed, but only abnormal results are displayed) Results for orders placed or performed during the hospital encounter of 08/18/18  Basic metabolic  panel  Result Value Ref Range   Sodium 142 135 - 145 mmol/L   Potassium 6.1 (H) 3.5 - 5.1 mmol/L   Chloride 109 98 - 111 mmol/L   CO2 30 22 - 32 mmol/L   Glucose, Bld 96 70 - 99 mg/dL   BUN 23 8 - 23 mg/dL   Creatinine, Ser 1.91 (H) 0.44 - 1.00 mg/dL   Calcium 8.6 (L) 8.9 - 10.3 mg/dL   GFR calc non Af Amer 47 (L) >60 mL/min   GFR calc Af Amer 55 (L) >60 mL/min   Anion gap 3 (L) 5 - 15  Brain natriuretic peptide  Result Value Ref Range   B Natriuretic Peptide 252.1 (H) 0.0 - 100.0 pg/mL  CBC with Differential/Platelet  Result Value Ref Range   WBC 4.1 4.0 - 10.5 K/uL   RBC 2.97 (L) 3.87 - 5.11 MIL/uL   Hemoglobin 7.9 (L) 12.0 - 15.0 g/dL   HCT 47.8 (L) 29.5 - 62.1 %   MCV 96.0 80.0 - 100.0 fL   MCH 26.6 26.0 - 34.0 pg   MCHC 27.7 (L) 30.0 - 36.0 g/dL   RDW 30.8 (H) 65.7 - 84.6 %   Platelets 151 150 - 400 K/uL   nRBC 0.5 (H) 0.0 - 0.2 %   Neutrophils Relative % 67 %   Neutro Abs 2.8 1.7 - 7.7 K/uL   Lymphocytes Relative 20 %   Lymphs Abs 0.8 0.7 - 4.0 K/uL   Monocytes Relative 7 %   Monocytes Absolute 0.3 0.1 - 1.0 K/uL   Eosinophils Relative 3 %   Eosinophils Absolute 0.1 0.0 - 0.5 K/uL   Basophils Relative 1 %   Basophils Absolute 0.0 0.0 - 0.1 K/uL   Immature Granulocytes 2 %   Abs Immature Granulocytes 0.06 0.00 - 0.07 K/uL  I-stat troponin, ED  Result Value Ref Range   Troponin i, poc 0.00 0.00 - 0.08 ng/mL   Comment 3  Dg Tibia/fibula Right  Result Date: 07/19/2018 CLINICAL DATA:  Right tib fib wound. EXAM: RIGHT TIBIA AND FIBULA - 2 VIEW COMPARISON:  Knee series 07/09/2018 FINDINGS: Advanced tricompartment degenerative changes within the right knee. No acute bony abnormality. No fracture, subluxation or dislocation. No radiographic changes of osteomyelitis. IMPRESSION: No acute bony abnormality. Electronically Signed   By: Charlett NoseKevin  Dover M.D.   On: 07/19/2018 23:21   Ct Abdomen Pelvis W Contrast  Result Date: 07/30/2018 CLINICAL DATA:  Possible  right-sided mass. EXAM: CT ABDOMEN AND PELVIS WITH CONTRAST TECHNIQUE: Multidetector CT imaging of the abdomen and pelvis was performed using the standard protocol following bolus administration of intravenous contrast. CONTRAST:  80mL OMNIPAQUE IOHEXOL 300 MG/ML  SOLN COMPARISON:  Chest CT 05/07/2018 FINDINGS: Lower chest: Streaky airspace consolidation versus atelectasis in bilateral lung bases. No evidence of pericardial effusion. Mildly enlarged heart. Small hiatal hernia. Hepatobiliary: No focal liver abnormality is seen. No gallstones, gallbladder wall thickening, or biliary dilatation. Pancreas: Unremarkable. No pancreatic ductal dilatation or surrounding inflammatory changes. Spleen: Normal in size without focal abnormality. Adrenals/Urinary Tract: Adrenal glands are unremarkable. Kidneys are without renal calculi, solid lesion, or hydronephrosis. 1.6 cm right renal cyst. Bladder is unremarkable. Stomach/Bowel: Stomach is within normal limits. Appendix appears normal. No evidence of bowel wall thickening, distention, or inflammatory changes. Vascular/Lymphatic: Minimal aortic atherosclerosis. No enlarged abdominal or pelvic lymph nodes. Reproductive: Uterus and left adnexa are unremarkable. Mild prominence of the right ovary which measures 3.4 cm. Other: There is a partially visualized due to collimation large right lateral abdominal wall subcutaneous complex fluid collection which measures at least 18 cm in diameter. Adjacent smaller hyperdense fluid collection is seen more medially measuring 4.6 cm. Musculoskeletal: No acute or significant osseous findings. IMPRESSION: 1. Partially visualized large right lateral abdominal wall subcutaneous complex fluid collection which measures at least 18 cm in diameter. Adjacent smaller hyperdense fluid collection is seen more medially measuring 4.6 cm. 2. Streaky airspace consolidation versus atelectasis in the bilateral lung bases. 3. No evidence of acute  abnormalities within the solid abdominal organs. Electronically Signed   By: Ted Mcalpineobrinka  Dimitrova M.D.   On: 07/30/2018 12:23   Dg Chest Portable 1 View  Result Date: 08/18/2018 CLINICAL DATA:  Shortness of breath EXAM: PORTABLE CHEST 1 VIEW COMPARISON:  07/19/2018 FINDINGS: Tracheostomy tube is in place. Cardiomegaly with vascular congestion and hazy perihilar edema. Aortic atherosclerosis. No pneumothorax. IMPRESSION: Cardiomegaly with vascular congestion and mild perihilar edema. Enlarged appearing central pulmonary vessels suggesting arterial hypertension. Electronically Signed   By: Jasmine PangKim  Fujinaga M.D.   On: 08/18/2018 02:01   Dg Chest Portable 1 View  Result Date: 07/19/2018 CLINICAL DATA:  Blood around trachea EXAM: PORTABLE CHEST 1 VIEW COMPARISON:  05/12/2018 FINDINGS: Tracheostomy remains in place, unchanged. Cardiomegaly with mild vascular congestion. Left perihilar opacity again noted. No confluent opacity on the right. No effusions or acute bony abnormality. IMPRESSION: Cardiomegaly with vascular congestion. Left perihilar opacity could reflect asymmetric edema or infection. Electronically Signed   By: Charlett NoseKevin  Dover M.D.   On: 07/19/2018 23:22    EKG EKG Interpretation  Date/Time:  Wednesday August 18 2018 01:39:54 EDT Ventricular Rate:  59 PR Interval:    QRS Duration: 155 QT Interval:  467 QTC Calculation: 463 R Axis:   -37 Text Interpretation:  Sinus rhythm Ventricular premature complex Right bundle branch block Confirmed by Nicanor AlconPalumbo, Kynleigh Artz ([id number]) on 08/18/2018 1:59:56 AM   Radiology Dg Chest Portable 1 View  Result Date: 08/18/2018 CLINICAL DATA:  Shortness  of breath EXAM: PORTABLE CHEST 1 VIEW COMPARISON:  07/19/2018 FINDINGS: Tracheostomy tube is in place. Cardiomegaly with vascular congestion and hazy perihilar edema. Aortic atherosclerosis. No pneumothorax. IMPRESSION: Cardiomegaly with vascular congestion and mild perihilar edema. Enlarged appearing central pulmonary  vessels suggesting arterial hypertension. Electronically Signed   By: Jasmine Pang M.D.   On: 08/18/2018 02:01    Procedures Procedures (including critical care time)  Medications Ordered in ED Medications  furosemide (LASIX) injection 40 mg (has no administration in time range)  albuterol (PROVENTIL) (2.5 MG/3ML) 0.083% nebulizer solution 5 mg (5 mg Nebulization Given 08/18/18 0151)       Final Clinical Impressions(s) / ED Diagnoses   Will admit for pulmonary edema.  I suspect the BMP is hemolyzed since the creatinine is normal.  Will recheck    Kamali Nephew, MD 08/18/18 5520

## 2018-08-18 NOTE — ED Notes (Addendum)
Bariatric bed requested by secretary for pt. , wound vac at right lower leg intact , temp foley catheter intact , IV site intact , denies pain/respirations unlabored , waiting for admitting MD.

## 2018-08-18 NOTE — ED Notes (Signed)
Ordered a heart healthy breakfast--Rachael Robertson  

## 2018-08-18 NOTE — ED Notes (Signed)
Pt sleeping at intervals rouses and gives thumbsup.

## 2018-08-18 NOTE — Progress Notes (Signed)
Patient placed on AVAPS   Settings  500VT 10R IPAP min 5  IPAP max 25  Rise 3 ITime 1.00

## 2018-08-18 NOTE — ED Notes (Signed)
Waiting on RT to transport with RN

## 2018-08-18 NOTE — ED Notes (Signed)
Called respiratory per pt request to be taken off vent to eat dinner.

## 2018-08-19 DIAGNOSIS — S301XXA Contusion of abdominal wall, initial encounter: Secondary | ICD-10-CM

## 2018-08-19 DIAGNOSIS — Z79891 Long term (current) use of opiate analgesic: Secondary | ICD-10-CM

## 2018-08-19 DIAGNOSIS — G2581 Restless legs syndrome: Secondary | ICD-10-CM

## 2018-08-19 DIAGNOSIS — E875 Hyperkalemia: Secondary | ICD-10-CM

## 2018-08-19 DIAGNOSIS — G8929 Other chronic pain: Secondary | ICD-10-CM

## 2018-08-19 DIAGNOSIS — J811 Chronic pulmonary edema: Secondary | ICD-10-CM

## 2018-08-19 LAB — BASIC METABOLIC PANEL
Anion gap: 6 (ref 5–15)
BUN: 21 mg/dL (ref 8–23)
CALCIUM: 8 mg/dL — AB (ref 8.9–10.3)
CO2: 28 mmol/L (ref 22–32)
Chloride: 108 mmol/L (ref 98–111)
Creatinine, Ser: 1.3 mg/dL — ABNORMAL HIGH (ref 0.44–1.00)
GFR calc Af Amer: 50 mL/min — ABNORMAL LOW (ref >60)
GFR, EST NON AFRICAN AMERICAN: 43 mL/min — AB (ref >60)
Glucose, Bld: 96 mg/dL (ref 70–99)
Potassium: 5.2 mmol/L — ABNORMAL HIGH (ref 3.5–5.1)
SODIUM: 142 mmol/L (ref 135–145)

## 2018-08-19 LAB — MRSA PCR SCREENING: MRSA by PCR: POSITIVE — AB

## 2018-08-19 MED ORDER — ENOXAPARIN SODIUM 40 MG/0.4ML ~~LOC~~ SOLN
40.0000 mg | Freq: Two times a day (BID) | SUBCUTANEOUS | Status: DC
  Administered 2018-08-19 – 2018-08-25 (×13): 40 mg via SUBCUTANEOUS
  Filled 2018-08-19 (×13): qty 0.4

## 2018-08-19 MED ORDER — ORAL CARE MOUTH RINSE
15.0000 mL | Freq: Two times a day (BID) | OROMUCOSAL | Status: DC
  Administered 2018-08-19 – 2018-08-24 (×7): 15 mL via OROMUCOSAL

## 2018-08-19 MED ORDER — FUROSEMIDE 10 MG/ML IJ SOLN
40.0000 mg | Freq: Two times a day (BID) | INTRAMUSCULAR | Status: DC
  Administered 2018-08-19 – 2018-08-20 (×2): 40 mg via INTRAVENOUS
  Filled 2018-08-19 (×2): qty 4

## 2018-08-19 NOTE — Progress Notes (Signed)
Placed patient on BIPAP AVAPS mode, below settings per order.  Spare trach at bedside.   VT 500 Epap 5 Rise 3.0

## 2018-08-19 NOTE — Progress Notes (Signed)
   08/19/18 1400  Clinical Encounter Type  Visited With Patient  Visit Type Initial  Referral From Nurse  Consult/Referral To Chaplain  Spiritual Encounters  Spiritual Needs Emotional  Stress Factors  Patient Stress Factors Health changes;Other (Comment)   Responded to spiritual care consult. PT was alert and elated about the chaplain consult. PT began asking questions about faith. I offered spiritual care with words of encouragement, empathic listening, and ministry of presence. PT was truly thankful for the the religious discussion and stated that she will continue to work through her faith journey. PT was tearful and appreciative of the Chaplain time with her. Also she stated that she has support system with her family. Chaplain available as needed. Chaplain Orest Dikes [phone]

## 2018-08-19 NOTE — Progress Notes (Signed)
   Subjective: No overnight events. Rachael Robertson reports that her breathing feels improved compared to yesterday, but it is not back to her baseline yet. She still feels diffusely swollen. Her right leg feels "angry." She is amenable to VTE prophylaxis with anticoagulants if they are injected into the thigh. She had formation of a hematoma which became chronic in her abdomen from prior Lovenox injections during hospitalization. All of her questions were addressed.   Objective:  Vital signs in last 24 hours: Vitals:   08/19/18 0400 08/19/18 0500 08/19/18 0512 08/19/18 0600  BP: (!) 86/46 (!) 83/31 (!) 83/31 (!) 102/37  Pulse: (!) 52 (!) 55 (!) 55 (!) 56  Resp: 15 16 14 15   Temp: 98.1 F (36.7 C) 98.1 F (36.7 C)  98.1 F (36.7 C)  TempSrc:      SpO2: 100% 100% 99% 100%  Weight:  (!) 211.4 kg    Height:       Gen: obese female, no distress CV: RRR, no murmurs Pulm: CTAB, no crackles, normal effort 3L by trach collar Abd: soft, non-distended, nontender, large right-sided chronic hematoma Ext: RLE hematoma with wound vac in place, non-pitting edema bilaterally, stasis dermatitis  Assessment/Plan:  Principal Problem:   Acute on chronic respiratory failure with hypoxia (HCC) Active Problems:   OSA (obstructive sleep apnea)   Tracheostomy status (HCC)   Acute on chronic systolic heart failure (HCC)  Rachael Robertson is a67 y.ofemalewith chronic hypercapnea 2/2 OHS, OSA on chronic trach (on 2L supplemental O2 via trach collar during the day and trilogy ventilator qhs), HFrEF (EF 45-50%), CKDIII,history of PE, no longer on anticoagulation after hematoma of RLE required I&D last month, who presented with 3 days of progressive dyspnea and was subsequently found to have acute on chronic hypoxic respiratory failure with pulmonary edema.   Acute on Chronic Hypoxic Respiratory Failure  Pulmonary Eedema 2/2 Acute on Chronic Systolic Heart Failure OSA/OHS Tracheostomy Dependent - Used to  be on spironolactone 100mg  daily and bumex 2mg  daily. These were discontinued after last hospitalization. She will likely need to resume a diuretic regimen on discharge. However, patient's blood pressures have been soft in the hospital, ranging in the 80s-100s systolic overnight.  - Currently on 3L by trach collar. Home oxygen requirement is 2L by trach collar during the day. - Received 40mg  IV lasix BID yesterday. Net negative 5L. Cr increased from 1.2 to 1.3. Baseline Cr 1.1-1.2. Plan - Lasix 40mg  IV BID - Continue home metoprolol. Hold for low BP or HR. - Strict I&O, daily weights - Wean supplemental oxygen to home dose as tolerates - trilogy vent qhs  Hyperkalemia  - K elevated to 6.1 on admission. Decreased to 5.2 today. Plan - Trend BMP while diuresing  Chronic Lymphedema RLE Hematoma  - Wound vac in place  - Consult wound care  VTE Ppx - Discussed Lovenox dosing with pharmacy - Lovenox 40mg  BID. To be administered subcutaneously into the thigh rather than abdomen.   COPD: continue home albuterol and duonebs prn, dulera inhaler bid  Gout: continue home colchicine, allopurinol  Hypothyroidism: continue home levothyroxine Chronic pain: continue tramadol 50mg  q6hrs prn RLS: continue home mirapex  Dispo: Anticipated discharge in approximately 1-2 days.  Rachael Robertson, Rachael Corti, MD 08/19/2018, 6:20 AM Pager: [phone]

## 2018-08-19 NOTE — Consult Note (Signed)
[place] Nurse wound consult note Reason for Consult: NPWT present on admission.  Patient with copy of prescription from Dr. Audelia Hives (Plastic Surgeon) dated 08/10/2018 that states NPWT is to be changed twice weekly with settings of 149mHg negative pressure delivered continuously. Wound type:Chronic, nonhealing full thickness wound on RLE Pressure Injury POA: NA Measurement:4.8cm x 15cm x 0.2cm Wound bed:Not entirely visible due to the presence of green mesh.  Patient states that the mesh is a "pig bladder" and that she has been instructed that caregivers should not remove it, but allow it to fall off Drainage (amount, consistency, odor) Patient's wound bleeds for a few minutes post NPWT device removal, but ceases with direct pressure. Periwound:intact.   Dressing procedure/placement/frequency: Dressing removed while allowing patient to take brief breaks for deep breaths. One piece of black foam removed, green mesh stays in place and covers 75% of wound bed. Frank bleeding is controlled with gentle pressure for 3-5 minutes. Wound is cleansed with NS and gently patted dry.  Periwound skin is protected with drape. One piece of black foam is used to cover wound and mesh and dressing is covered with drape. Dressing is then attached to 1229mg continuous negative pressure and an immediate seal is achieved. Patient tolerated procedure well.  Dressing kit for next dressing change is at bedside.  Bedside RN is able to perform.  Dressings are ordered for every 3 days.   [place] [place] will not follow, but will remain available to this patient, the nursing and medical teams.  Please re-consult if needed. Thanks, LaMaudie FlakesMSN, RN, GNKilgoreCWArther AbbottPager# [phone]

## 2018-08-19 NOTE — Progress Notes (Signed)
RT placed patient on AVAPS HS. Patient tolerating well at this time.

## 2018-08-20 DIAGNOSIS — I5023 Acute on chronic systolic (congestive) heart failure: Secondary | ICD-10-CM

## 2018-08-20 LAB — BASIC METABOLIC PANEL
Anion gap: 3 — ABNORMAL LOW (ref 5–15)
BUN: 19 mg/dL (ref 8–23)
CO2: 34 mmol/L — ABNORMAL HIGH (ref 22–32)
Calcium: 7.9 mg/dL — ABNORMAL LOW (ref 8.9–10.3)
Chloride: 103 mmol/L (ref 98–111)
Creatinine, Ser: 1.17 mg/dL — ABNORMAL HIGH (ref 0.44–1.00)
GFR calc Af Amer: 57 mL/min — ABNORMAL LOW (ref >60)
GFR calc non Af Amer: 49 mL/min — ABNORMAL LOW (ref >60)
Glucose, Bld: 78 mg/dL (ref 70–99)
Potassium: 4.4 mmol/L (ref 3.5–5.1)
SODIUM: 140 mmol/L (ref 135–145)

## 2018-08-20 MED ORDER — FUROSEMIDE 10 MG/ML IJ SOLN
40.0000 mg | Freq: Every day | INTRAMUSCULAR | Status: DC
  Administered 2018-08-21 – 2018-08-22 (×2): 40 mg via INTRAVENOUS
  Filled 2018-08-20 (×2): qty 4

## 2018-08-20 MED ORDER — SODIUM CHLORIDE 0.9% FLUSH
3.0000 mL | Freq: Two times a day (BID) | INTRAVENOUS | Status: DC
  Administered 2018-08-20 – 2018-08-25 (×9): 3 mL via INTRAVENOUS

## 2018-08-20 MED ORDER — CHLORHEXIDINE GLUCONATE 0.12 % MT SOLN
15.0000 mL | Freq: Two times a day (BID) | OROMUCOSAL | Status: DC
  Administered 2018-08-20 – 2018-08-25 (×10): 15 mL via OROMUCOSAL
  Filled 2018-08-20 (×7): qty 15

## 2018-08-20 MED ORDER — HYDROCORTISONE 1 % EX CREA
TOPICAL_CREAM | Freq: Two times a day (BID) | CUTANEOUS | Status: DC
  Administered 2018-08-20: 1 via TOPICAL
  Administered 2018-08-21 – 2018-08-24 (×3): via TOPICAL
  Filled 2018-08-20: qty 28

## 2018-08-20 NOTE — Progress Notes (Signed)
Medicine attending: I examined this patient today together with resident physician Lenward Chancellor and I concur with her evaluation and management plan. She is symptomatically improving with diuresis.  Now -9 L fluid balance. Lungs are clear. Oxygenating well on 5 L oxygen via tracheostomy cuff. Continue current management plan.

## 2018-08-20 NOTE — Progress Notes (Signed)
Pt refused Metoprolol po - Heart rate is 59 to 60 - BP stable

## 2018-08-20 NOTE — Progress Notes (Signed)
   Subjective: Rachael Robertson is feeling better today. Her breathing has improved, and she feels less swollen. Discussed that she is diuresing well and maintaining good kidney function.   Objective:  Vital signs in last 24 hours: Vitals:   08/20/18 0333 08/20/18 0400 08/20/18 0500 08/20/18 0600  BP:  (!) 97/36 (!) 99/43   Pulse: (!) 58 (!) 55 (!) 57 (!) 54  Resp: 18 15 (!) 23 14  Temp: 98.2 F (36.8 C) 98.1 F (36.7 C) 97.9 F (36.6 C) 97.7 F (36.5 C)  TempSrc:  Core    SpO2: 100% 99% 99% 100%  Weight:      Height:       General: obese female, lying in bed in NAD CV: RRR; no murmurs Pulm: CTAB, normal effort on trach collar Ext: RLE hematoma with wound vac in place, non-pitting edema bilaterally, stasis dermatitis  Assessment/Plan:  Principal Problem:   Acute on chronic respiratory failure with hypoxia (HCC) Active Problems:   OSA (obstructive sleep apnea)   Tracheostomy status (HCC)   Acute on chronic systolic heart failure (HCC)   Maternal pulmonary embolus (PE), history of   Abdominal wall hematoma  Rachael Robertson is a67 y.ofemalewith chronic hypercapnea 2/2 OHS, OSA on chronic trach (on 2L supplemental O2 viatrach collar during the day andtrilogy ventilator qhs), HFrEF (EF 45-50%), CKDIII,history of PE, no longer on anticoagulation after hematoma of RLE required I&D last month,who presented with 3 days of progressivedyspneaand was subsequently found to have acute on chronic hypoxic respiratory failure with pulmonary edema.   Acute on Chronic Hypoxic Respiratory Failure  Pulmonary Eedema 2/2 Acute on Chronic Systolic Heart Failure OSA/OHS Tracheostomy Dependent -Used to be on spironolactone 100mg  daily and bumex 2mg  daily. These were discontinued after last hospitalization. She will likely need to resume a diuretic regimen on discharge. Patient's blood pressures have been soft in the hospital, ranging in the 80s-100s systolic overnight.  - Currently on 3L by  trach collar. Home oxygen requirement is 2L by trach collar during the day. - Received 40mg  IV lasix once yesterday. Net negative 2L, overall 6.8L this admission. Cr decreased from 1.3 to 1.17. Baseline Cr 1.1-1.2. Normokalemic.  - It is difficult to assess the patient's volume status given her morbid obesity. Her weights appear inaccurate. She clinically feels improved. Will consider repeat CXR to ensure edema has resolved. Plan - Lasix 40mg  IV daily -Continue home metoprolol. Hold for low BP or HR. -Strict I&O, daily weights - Wean supplemental oxygen to home dose as tolerates - Trilogy vent qhs - Trend BMP while diuresing  Chronic Lymphedema RLE Hematoma  - Wound vac in place  - Wound care following, appreciate recs - PT/OT   VTE Ppx - Continue lovenox 40mg  BID. To be administered subcutaneously into the thigh rather than abdomen.   COPD: continue home albuterol and duonebs prn, dulera inhaler bid  Gout: continue home colchicine, allopurinol  Hypothyroidism: continue home levothyroxine Chronic pain:continue tramadol 50mg  q6hrs prn RLS: continue home mirapex  Dispo: Anticipated discharge in approximately 1-2 days.  Rachael Robertson, Rachael Corti, MD 08/20/2018, 6:16 AM Pager: [phone]

## 2018-08-20 NOTE — TOC Initial Note (Addendum)
Transition of Care [facility]) - Initial/Assessment Note    Patient Details  Name: Rachael Robertson MRN: [id number] Date of Birth: 1951-04-12  Transition of [facility]) CM/SW Contact:    Bartholomew Crews, RN Phone Number: 08/20/2018, 1:01 PM  Clinical Narrative:                 S/w with patient at bedside. Patient was very receptive to CM visit. Stated that she moved from [place] to [place], [place] to be close to family in 04/2018. Her niece and niece's family assist with her care. Patient rents a single family home. Discussed if she was set up with doctors in this area-patient stated that she was having difficulty until she ended up in the hospital shortly after moving to [place]. Patient states that she had been living at [facility] for rehab services-stated her 20 days would be up 3/16. Patient stated that she did not feel that she was making improvements with her mobility, and that her diet at the facility was too high in sodium causing her to swell. Patient proactively asking what do I need to do to go home. Discussed need for PT and OT evaluation.   Patient states that at her home she has [facility] who provides aides to assist her 4 hours/day-2 hrs in the AM and 2 hrs in the PM. She states her niece assists her with meals during the day. States that she has a manual wheelchair at home, and an Clinical research associate. She has a trilogy vent at home that she uses for bipap at night and has O2 during the day. [employer] manages her oxygen and trilogy.   Stated that she was using [employer] transportation for medical appointments which was $35 a trip, but her niece was able to purchase a used Lucianne Lei that can transport her to appointments.   Patient uses mail order pharmacy for long term meds and [facility] on [address] for short term meds.   Patient also has vac dressing to R lower extremity for wound care at this time. Patient stated that the dressing changes were not working.   Received consult for  LTACH. Discussed patient needs with LTACH liasion. Patient not meeting criteria for LTACH d/t at baseline for trach and bipap, and not ICU at this time. At this time patient needs to be best met either in SNF vs HH. PT and OT evals pending.   CM to follow for transition of care needs.   Expected Discharge Plan: Skilled Nursing Facility Barriers to Discharge: No SNF bed   Patient Goals and CMS Choice Patient states their goals for this hospitalization and ongoing recovery are:: to return home      Expected Discharge Plan and Services Expected Discharge Plan: [place] Discharge Planning Services: CM Consult   Living arrangements for the past 2 months: [place], [place]                          Prior Living Arrangements/Services Living arrangements for the past 2 months: [place], [place] Lives with:: Self Patient language and need for interpreter reviewed:: No Do you feel safe going back to the place where you live?: Yes      Need for Family Participation in Patient Care: Yes (Comment) Care giver support system in place?: Yes (comment) Current home services: Other (comment)(4 hours/day 7 days/week with Mickel Crow) Criminal Activity/Legal Involvement Pertinent to Current Situation/Hospitalization: No -  Comment as needed  Activities of Daily Living Home Assistive Devices/Equipment: Vent/Trach supplies, Oxygen, Hospital bed(currently resides in [facility]) ADL Screening (condition at time of admission) Patient's cognitive ability adequate to safely complete daily activities?: Yes Is the patient deaf or have difficulty hearing?: No Does the patient have difficulty seeing, even when wearing glasses/contacts?: No Does the patient have difficulty concentrating, remembering, or making decisions?: No Patient able to express need for assistance with ADLs?: Yes Does the patient have difficulty dressing or bathing?:  Yes Independently performs ADLs?: No Communication: Needs assistance Is this a change from baseline?: Pre-admission baseline Dressing (OT): Needs assistance Is this a change from baseline?: Pre-admission baseline Grooming: Needs assistance Is this a change from baseline?: Pre-admission baseline Feeding: Needs assistance Is this a change from baseline?: Pre-admission baseline Bathing: Needs assistance Is this a change from baseline?: Pre-admission baseline Toileting: Needs assistance Is this a change from baseline?: Pre-admission baseline In/Out Bed: Needs assistance Is this a change from baseline?: Pre-admission baseline Walks in Home: Needs assistance Is this a change from baseline?: Pre-admission baseline Does the patient have difficulty walking or climbing stairs?: Yes Weakness of Legs: Both Weakness of Arms/Hands: Both  Permission Sought/Granted   Permission granted to share information with : Yes, Verbal Permission Granted              Emotional Assessment Appearance:: Appears stated age Attitude/Demeanor/Rapport: Engaged Affect (typically observed): Accepting, Calm, Happy Orientation: : Oriented to Self, Oriented to  Time, Oriented to Place, Oriented to Situation Alcohol / Substance Use: Alcohol Use(1-2 drinks/year per H&P) Psych Involvement: No (comment)  Admission diagnosis:  Acute pulmonary edema (HCC) [J81.0] Iron deficiency anemia, unspecified iron deficiency anemia type [D50.9] Patient Active Problem List   Diagnosis Date Noted  . Abdominal wall hematoma   . Acute on chronic respiratory failure with hypoxia ([place]) 08/18/2018  . Acute pulmonary edema (HCC)   . Morbid obesity ([place])   . Hypothyroidism   . Chronic gout without tophus   . Hx of pulmonary embolus   . Hematoma of leg, right, initial encounter   . Oxygen dependent   . Lymphedema of both lower extremities 08/10/2018  . Leg wound, right 08/10/2018  . Subcutaneous hematoma 07/26/2018  . Acute  blood loss anemia 07/26/2018  . Hematuria 07/26/2018  . Tracheostomy complication ([place])   . Right Lower Lobe PNU  05/28/2018  . Acute on chronic systolic heart failure ([place]) 05/20/2018  . Arthritis 05/20/2018  . Other chronic pain 05/20/2018  . Lethargy   . Hemoptysis   . OSA (obstructive sleep apnea)   . Tracheostomy status ([place])   . Hypoxemia   . Chronic respiratory failure with hypoxia (HCC) 05/07/2018   PCP:  Lars Mage, MD Pharmacy:   [facility] [place], [place] - [address] AT [address] [place] [address] Phone: [phone] Fax: [place], [place] - [address] [place] [address] [place] [address] Phone: [phone] Fax: [phone]     Social Determinants of Health (SDOH) Interventions    Readmission Risk Interventions 30 Day Unplanned Readmission Risk Score     ED to Hosp-Admission (Current) from 08/18/2018 in [facility]  30 Day Unplanned Readmission Risk Score (%)  24 Filed at 08/20/2018 1200     This score is the patient's risk of an unplanned readmission within 30 days of being discharged (  0 -100%). The score is based on dignosis, age, lab data, medications, orders, and past utilization.   Low:  0-14.9   Medium: 15-21.9   High: 22-29.9   Extreme: 30 and above       No flowsheet data found.

## 2018-08-20 NOTE — Progress Notes (Signed)
RT placed patient on BIPAP HS. Patient tolerating well at this time.  

## 2018-08-21 ENCOUNTER — Inpatient Hospital Stay (HOSPITAL_COMMUNITY): Payer: Medicare Other

## 2018-08-21 DIAGNOSIS — I872 Venous insufficiency (chronic) (peripheral): Secondary | ICD-10-CM

## 2018-08-21 LAB — BASIC METABOLIC PANEL
Anion gap: 7 (ref 5–15)
BUN: 17 mg/dL (ref 8–23)
CO2: 36 mmol/L — ABNORMAL HIGH (ref 22–32)
Calcium: 8.4 mg/dL — ABNORMAL LOW (ref 8.9–10.3)
Chloride: 97 mmol/L — ABNORMAL LOW (ref 98–111)
Creatinine, Ser: 1.11 mg/dL — ABNORMAL HIGH (ref 0.44–1.00)
GFR calc non Af Amer: 52 mL/min — ABNORMAL LOW (ref >60)
Glucose, Bld: 78 mg/dL (ref 70–99)
Potassium: 4.5 mmol/L (ref 3.5–5.1)
Sodium: 140 mmol/L (ref 135–145)

## 2018-08-21 NOTE — Progress Notes (Signed)
Later Progress Note    08/21/18 1217  PT Visit Information  Last PT Received On 08/21/18  Assistance Needed +2  History of Present Illness Super morbid obese 68 year old woman with chronic hypoxic and hypercapnic respiratory failure, obesity sleep apnea, oxygen dependent, chronic trach, on chronic ventilator support at night.  Nondiagnostic CT angiogram of the chest in November 2019 for pulmonary embolism, admitted with Acute on Crhonic combined systolic and diastolic heart failure with significant fluid overload likely related to temporary discontinuation of diuretics likely exacerbated by acute bronchitis.  Subjective Data  Patient Stated Goal return home avoid going to SNF  Precautions  Precautions Fall  Precaution Comments trach  Required Braces or Orthoses Other Brace  Other Brace wears shoes for standing. Wound vac to R lower leg.   Pain Assessment  Pain Score 5  Pain Location bil LE's at knees  Pain Descriptors / Indicators Aching;Burning;Discomfort  Pain Intervention(s) Monitored during session  Cognition  Arousal/Alertness Awake/alert  Behavior During Therapy WFL for tasks assessed/performed  Overall Cognitive Status Within Functional Limits for tasks assessed  General Comments pt excellent at directing her care, cannot multitask  Bed Mobility  Overal bed mobility Needs Assistance  Bed Mobility Sit to Supine  Sit to supine Max assist  General bed mobility comments pt repositioned herself once legs in the bed--using trendelenburg of the bed.  Transfers  Overall transfer level Needs assistance  Equipment used Rolling walker (2 wheeled)  Transfers Sit to/from [employer]  Sit to Stand Min assist;Mod assist  General transfer comment heavy min to light mod from lower surface of the chair which didn't have armrest available  Ambulation/Gait  General Gait Details side stepped up toward [facility] in addition to the pivot.  Balance  Overall balance assessment Needs  assistance  Sitting balance-Leahy Scale Fair  Standing balance support Bilateral upper extremity supported  Standing balance-Leahy Scale Poor  Standing balance comment reliant on the RW and minor external assist  for a very short period of time  General Comments  General comments (skin integrity, edema, etc.) VSS  sats low 90's on 28% TC and upper 90's on 35% TC  PT - End of Session  Equipment Utilized During Treatment Oxygen  Activity Tolerance Patient limited by fatigue;Patient limited by pain  Patient left in bed;with call bell/phone within reach;with bed alarm set  Nurse Communication Mobility status   PT - Assessment/Plan  PT Plan Current plan remains appropriate  PT Visit Diagnosis Other abnormalities of gait and mobility (R26.89);Pain;Muscle weakness (generalized) (M62.81)  Pain - part of body Leg  PT Frequency (ACUTE ONLY) Min 3X/week  Follow Up Recommendations Home health PT;Other (comment)  PT equipment None recommended by PT  AM-PAC PT "6 Clicks" Mobility Outcome Measure (Version 2)  Help needed turning from your back to your side while in a flat bed without using bedrails? 3  Help needed moving from lying on your back to sitting on the side of a flat bed without using bedrails? 3  Help needed moving to and from a bed to a chair (including a wheelchair)? 2  Help needed standing up from a chair using your arms (e.g., wheelchair or bedside chair)? 3  Help needed to walk in hospital room? 1  Help needed climbing 3-5 steps with a railing?  1  6 Click Score 13  Consider Recommendation of Discharge To: CIR/SNF/LTACH  PT Goal Progression  Progress towards PT goals Progressing toward goals  Acute Rehab PT Goals  PT Goal Formulation  With patient  Time For Goal Achievement 08/11/18  Potential to Achieve Goals Fair  PT Time Calculation  PT Start Time (ACUTE ONLY) 1130  PT Stop Time (ACUTE ONLY) 1217  PT Time Calculation (min) (ACUTE ONLY) 47 min  PT General Charges  $$ ACUTE PT  VISIT 1 Visit  PT Treatments  $Therapeutic Activity 38-52 mins   08/21/2018  [place] Bing, PT Acute Rehabilitation Services [phone]  (pager) [phone]  (office)

## 2018-08-21 NOTE — Evaluation (Signed)
Physical Therapy Evaluation Patient Details Name: Rachael Robertson MRN: [id number] DOB: [birth year] Today's Date: 08/21/2018   History of Present Illness  Super morbid obese 68 year old woman with chronic hypoxic and hypercapnic respiratory failure, obesity sleep apnea, oxygen dependent, chronic trach, on chronic ventilator support at night.  Nondiagnostic CT angiogram of the chest in November 2019 for pulmonary embolism, admitted with Acute on Crhonic combined systolic and diastolic heart failure with significant fluid overload likely related to temporary discontinuation of diuretics likely exacerbated by acute bronchitis.  Clinical Impression  Pt admitted with/for acute on chronic combined HF and general weakness.  Pt needing solid minimal assist with use of assistive DME.  Pt will have family commitment to assist after d/c.  Pt currently limited functionally due to the problems listed below.  (see problems list.)  Pt will benefit from PT to maximize function and safety to be able to get home safely with available assist.     Follow Up Recommendations Home health PT;Other (comment)(pt plans to go home with committed family assist)    Equipment Recommendations  None recommended by PT    Recommendations for Other Services       Precautions / Restrictions Precautions Precautions: Fall Other Brace: wears shoes for standing. Wound vac to R lower leg.  Restrictions Weight Bearing Restrictions: No      Mobility  Bed Mobility Overal bed mobility: Needs Assistance Bed Mobility: Supine to Sit     Supine to sit: Min assist;Mod assist     General bed mobility comments: min hand held assist to come to the EOB, pt scooted both LE toward EOB after warm up exercies.  Transfers Overall transfer level: Needs assistance Equipment used: Rolling walker (2 wheeled) Transfers: Sit to/from [employer] Sit to Stand: Min assist;From elevated surface Stand pivot transfers: Min  assist       General transfer comment: close to a guard assist.  Pt pulling up unsafely on the RW.Marland Kitchen  Flexed over the RW with forearms on the RW, RW blocked  Ambulation/Gait                Stairs            Wheelchair Mobility    Modified Rankin (Stroke Patients Only)       Balance Overall balance assessment: Needs assistance Sitting-balance support: Feet supported;Feet unsupported;No upper extremity supported;Single extremity supported Sitting balance-Leahy Scale: Fair       Standing balance-Leahy Scale: Poor Standing balance comment: reliant on the RW and minor external assist  for a very short period of time                             Pertinent Vitals/Pain Pain Assessment: 0-10 Pain Score: 5  Pain Location: bil LE's at knees Pain Descriptors / Indicators: Aching;Burning;Discomfort Pain Intervention(s): Monitored during session    Home Living Family/patient expects to be discharged to:: Private residence(pt's niece/family have committed to assist pt for 3 weeks) Living Arrangements: Alone Available Help at Discharge: Family;Available 24 hours/day;Available PRN/intermittently Type of Home: House Home Access: Level entry     Home Layout: One level Home Equipment: Hospital bed;Electric scooter;Walker - 2 wheels;Bedside commode;Hand held shower head;Adaptive equipment Additional Comments: has an aid in the a.m and p.m    Prior Function Level of Independence: Needs assistance   Gait / Transfers Assistance Needed: lmited to stand pivots right now, electric w/c for further mobility.   ADL's / [employer]  Needed: assisted for pericare, bathing, dressing and housekeeping/meals  Comments: per pt 7x/week aide 2hour AM 2 hour PM. family PRN      Hand Dominance   Dominant Hand: Right    Extremity/Trunk Assessment   Upper Extremity Assessment Upper Extremity Assessment: Defer to OT evaluation    Lower Extremity Assessment Lower  Extremity Assessment: RLE deficits/detail;LLE deficits/detail RLE Deficits / Details: able to w/bear, but generally weak.  Pt having difficulty advancing right due to weaker L LE LLE Deficits / Details: weaker than right, but less painful       Communication   Communication: No difficulties;Passy-Muir valve  Cognition Arousal/Alertness: Awake/alert Behavior During Therapy: WFL for tasks assessed/performed Overall Cognitive Status: Within Functional Limits for tasks assessed                                 General Comments: pt excellent at directing her care, cannot multitask      General Comments General comments (skin integrity, edema, etc.): VSS through out    Exercises Other Exercises Other Exercises: warm up hip/knee exercise prior to mobility   Assessment/Plan    PT Assessment Patient needs continued PT services  PT Problem List Decreased strength;Decreased activity tolerance;Decreased mobility;Obesity       PT Treatment Interventions DME instruction;Gait training;Functional mobility training;Therapeutic activities;Therapeutic exercise;Balance training;Patient/family education    PT Goals (Current goals can be found in the Care Plan section)  Acute Rehab PT Goals Patient Stated Goal: return home avoid going to SNF PT Goal Formulation: With patient Time For Goal Achievement: 08/11/18 Potential to Achieve Goals: Fair    Frequency Min 3X/week   Barriers to discharge Decreased caregiver support pt will have family for about 3 weeks after d/c    Co-evaluation               AM-PAC PT "6 Clicks" Mobility  Outcome Measure Help needed turning from your back to your side while in a flat bed without using bedrails?: A Little Help needed moving from lying on your back to sitting on the side of a flat bed without using bedrails?: A Little Help needed moving to and from a bed to a chair (including a wheelchair)?: A Lot Help needed standing up from a  chair using your arms (e.g., wheelchair or bedside chair)?: A Little Help needed to walk in hospital room?: Total Help needed climbing 3-5 steps with a railing? : Total 6 Click Score: 13    End of Session Equipment Utilized During Treatment: Oxygen Activity Tolerance: Patient limited by fatigue;Patient limited by pain Patient left: in chair;with call bell/phone within reach;with nursing/sitter in room Nurse Communication: Mobility status PT Visit Diagnosis: Other abnormalities of gait and mobility (R26.89);Pain;Muscle weakness (generalized) (M62.81) Pain - part of body: Leg(both)    Time: 2035-5974 PT Time Calculation (min) (ACUTE ONLY): 56 min   Charges:   PT Evaluation $PT Eval Moderate Complexity: 1 Mod PT Treatments $Therapeutic Activity: 23-37 mins $Self Care/Home Management: 8-22        08/21/2018  [place] Bing, PT Acute Rehabilitation Services [phone]  (pager) [phone]  (office)  Rachael Robertson 08/21/2018, 11:39 AM

## 2018-08-21 NOTE — TOC Progression Note (Signed)
Transition of Care [facility]) - Progression Note    Patient Details  Name: Rachael Robertson MRN: [id number] Date of Birth: April 02, 1951  Transition of Care [facility]) CM/SW Contact  Lawerance Sabal, RN Phone Number: 08/21/2018, 2:30 PM  Clinical Narrative:    [name] x2 years with home vent and equipment at home PTA.  Wound VAC that would be new set up.   Consulted by Dr Avie Arenas to discuss DC plans w patientfor potential DC tomorrow.  Spoke w patient at bedside. SHe states that she lives at home by herself, has [place] aid 7a-9a, and 7p-9p. She states her niece [name] will be coming over to house and caring for her during the day, but that she will be home alone at night. Patient states she has DME hospital bed w trapeze, chair w trapeze, Vent, Trach, suction, nebulizer, pur wic, 3/1.  Discussed concerns w patient about her plugging at night. She states that she has plugged 4 times in the past 2 years and describes herself as "lucky" with each incident including at least one where she needed CPR. Her plan is to wear her life alert and use that to notify authorities if she needs help. CM stated concern that by the time they arrived it may be too late. Patient could not verbalize a better emergency plan. Suggested she discuss more help at home at night with family.  Continued to discuss her home alone at night and how she would get to bathroom. Patient states that she is incontinent of stool and that "sometimes I have to wait until I can get cleaned up but we are working on that with my digestive track to have more control of it."  Upon speaking with patient she was trying to locate phone in her bed, she was unable to mobilize in the bed well enough to locate her phone. Concerned that she may not be as independently able to notify EMS or family at night if there was a need, although she feels she could.    Spoke w [facility] at this time they cannot confirm that can accept patient. Noted that they declined to accept  patient at time of last hospital stay citing she was not appropriate for home care.   Spoke w niece who states she lives 4 minutes away and patient lives across the street from [place].  CM suggested to [place] that patient will need more supervision at night, [name] is considering how they will get more help at night. She states that as far as night time incontinence is concerned that most of the time the patient has a BM prior to [place] leaving at night.  Niece states that they have many work arounds at home such as trapeze over bed and chair with her electronic devices attached to them by strings so that she does not lose them. Niece states she provided help to patient at home for months PTA and is familiar with her equipment such as Vent, Trach, suction, nebulizer, pur wic, and is able to assist patient in cleaning up if patient can roll from side to side. Discussed skin care concerns with niece as well.  In order for patient to DC to home she will need [employer] vac at DC. Confirmed with rep that insurance Berkley Harvey will not be able to be obtained until Monday.    Expected Discharge Plan: Skilled Nursing Facility Barriers to Discharge: No SNF bed  Expected Discharge Plan and Services Expected Discharge Plan: Skilled Nursing Facility Discharge Planning Services: CM Consult  Living arrangements for the past 2 months: Skilled Nursing Facility, Single Family Home                           Social Determinants of Health (SDOH) Interventions    Readmission Risk Interventions 30 Day Unplanned Readmission Risk Score     ED to Hosp-Admission (Current) from 08/18/2018 in [place] [facility]  30 Day Unplanned Readmission Risk Score (%)  23 Filed at 08/21/2018 1200     This score is the patient's risk of an unplanned readmission within 30 days of being discharged (0 -100%). The score is based on dignosis, age, lab data, medications, orders, and past utilization.   Low:  0-14.9    Medium: 15-21.9   High: 22-29.9   Extreme: 30 and above       No flowsheet data found.

## 2018-08-21 NOTE — Progress Notes (Signed)
   Subjective: No overnight events. Patient reports that she feels close to her baseline, but is still somewhat dyspneic and requiring increased oxygen than what she uses at home. She reports she has been on lasix and torsemide at home in the past, but they were ineffective. She believes bumetanide was effective when she was taking it. She states she would like to go home when she is discharged rather than back to the SNF. She reports that her niece will be caring for her.   Objective:  Vital signs in last 24 hours: Vitals:   08/21/18 0100 08/21/18 0320 08/21/18 0400 08/21/18 0500  BP:   (!) 107/53   Pulse: (!) 57 (!) 57 (!) 58 (!) 57  Resp: 17 16 14 17   Temp: 98.8 F (37.1 C) 98.4 F (36.9 C) 98.2 F (36.8 C) 98.2 F (36.8 C)  TempSrc:   Core   SpO2: 98% 99% 98% 100%  Weight:      Height:       Gen: lying in bed, no distress CV: RRR, no murmurs Pulm: CTAB, normal effort on 5L by trach collar Ext: wound vac in place, non-pitting edema bilaterally  Assessment/Plan:  Principal Problem:   Acute on chronic respiratory failure with hypoxia (HCC) Active Problems:   OSA (obstructive sleep apnea)   Tracheostomy status (HCC)   Acute on chronic systolic heart failure (HCC)   Morbid obesity (HCC)   Hx of pulmonary embolus   Abdominal wall hematoma  Rachael Robertson is a67 y.ofemalewith chronic hypercapnea 2/2 OHS, OSA on chronic trach (on 2L supplemental O2 viatrach collar during the day andtrilogy ventilator qhs), HFrEF (EF 45-50%), CKDIII,history of PE, no longer on anticoagulation after hematoma of RLE required I&D last month,who presented with 3 days of progressivedyspneaand was subsequently found to have acute on chronic hypoxic respiratory failure with pulmonary edema.   Acute on Chronic Hypoxic Respiratory Failure  Pulmonary Eedema 2/2 Acute on Chronic Systolic Heart Failure OSA/OHS Tracheostomy Dependent -Used to be on spironolactone 100mg  daily and bumex 2mg  daily.  These were discontinued after last hospitalization. She will likely need to resume a diuretic regimen on discharge. - Currently on5L by trach collar. Home oxygen requirement is 2L by trach collar during the day. - Received 40mg  IV lasix once yesterday. Diuresed 3.4L, net negative 10.5L this admission.Cr stable at 1.1 today.BaselineCr 1.1-1.2. Normokalemic.  - It is difficult to assess the patient's volume status given her morbid obesity. Her weights appear inaccurate. She clinically feels improved. Repeat CXR showed resolution of edema that was present on admission. Plan for one more day of IV diuretics and transition to PO tomorrow. Plan -Lasix40mg  IV daily -Continuehome metoprolol. Hold for low BP or HR. -Strict I&O, daily weights - Wean supplemental oxygen to home dose as tolerates - Trilogy vent qhs - Trend BMP while diuresing  Chronic Lymphedema RLE Hematoma  - Wound vac in place  - Wound care following, appreciate recs - PT/OT   VTE Ppx - Continue lovenox 40mg  BID. To be administered subcutaneously into the thigh rather than abdomen.  COPD: continue home albuterol and duonebs prn, dulera inhaler bid  Gout: continue home colchicine, allopurinol  Hypothyroidism: continue home levothyroxine Chronic pain:continue tramadol 50mg  q6hrs prn RLS: continue home mirapex  Dispo: Anticipated discharge home tomorrow  , Rachael Corti, MD 08/21/2018, 6:48 AM Pager: [phone]

## 2018-08-22 LAB — BASIC METABOLIC PANEL
Anion gap: 9 (ref 5–15)
BUN: 14 mg/dL (ref 8–23)
CHLORIDE: 95 mmol/L — AB (ref 98–111)
CO2: 36 mmol/L — ABNORMAL HIGH (ref 22–32)
Calcium: 8.2 mg/dL — ABNORMAL LOW (ref 8.9–10.3)
Creatinine, Ser: 1.05 mg/dL — ABNORMAL HIGH (ref 0.44–1.00)
GFR calc Af Amer: >60 mL/min (ref >60)
GFR calc non Af Amer: 56 mL/min — ABNORMAL LOW (ref >60)
Glucose, Bld: 80 mg/dL (ref 70–99)
Potassium: 3.9 mmol/L (ref 3.5–5.1)
Sodium: 140 mmol/L (ref 135–145)

## 2018-08-22 NOTE — Progress Notes (Signed)
° °  Subjective: The patient was lying in her bed today. After a long conversation she agreed that although it is likely not ideal that she return home, she has tried the SNF facility and this failed her. As such, she believes she has as good a chance at home with her current plan as anywhere. Although I can not say that I agree with this, I do not find that she lacks capacity to make this decision. She denied headache, dyspnea, myalgias, leg wound pain, or chest pain.  Objective:  Vital signs in last 24 hours: Vitals:   08/22/18 0400 08/22/18 0500 08/22/18 0600 08/22/18 0700  BP:    (!) 110/49  Pulse: 61 (!) 47 (!) 48 (!) 54  Resp: (!) 23 14 16 19   Temp: 98.1 F (36.7 C) 98.2 F (36.8 C) 98.2 F (36.8 C) 98.2 F (36.8 C)  TempSrc:      SpO2: 97% 100% 98% 100%  Weight:  (!) 207.3 kg    Height:       General: A/O x4, in no acute distress, afebrile, nondiaphoretic Cardio: RRR, no mrg's  Pulmonary: CTA bilaterally, no wheezing or crackles today but minimal air flow noted  Abdomen: Bowel sounds normal, soft, nontender  MSK: BLE nontender, edematous +1 but improved  Assessment/Plan:  Principal Problem:   Acute on chronic respiratory failure with hypoxia (HCC) Active Problems:   OSA (obstructive sleep apnea)   Tracheostomy status (HCC)   Acute on chronic systolic heart failure (HCC)   Morbid obesity (HCC)   Hx of pulmonary embolus   Abdominal wall hematoma  Kash Reason is a67 y.ofemalewith a PMHx notable for chronic hypercapnea 2/2 OHS, OSA on chronic trach (on 2L supplemental O2 viatrach collar during the day andtrilogy ventilator qhs), HFrEF (EF 45-50%), CKDIII,history of PE, no longer on anticoagulation after hematoma of RLE required I&D last month,who presented with 3 days of progressivedyspneaand was subsequently found to have acute on chronic hypoxic respiratory failure with pulmonary edema. She improved notably with regard to her respiratory symptoms after a  prolonged stay. She has requested discharge home but due to her high risk illness, immobility, and need for 24/7 care which can no be provided she does not appear to be a good candidate for home health. We will await the availability of her home health wound vac prior to discharge.   Acute on Chronic Hypoxic Respiratory Failure  Pulmonary Eedema 2/2 Acute on Chronic Systolic Heart Failure OSA/OHS Tracheostomy Dependent Was previously taken off of spironolactone and Bumex on a prior discharge. She will need to likely be discharged on them again as she became floridly volume overloaded absent treatment. Home tomorrow.  Currently on5L by trach collar but weaning. Home oxygen requirement is 2L by trach collar during the day. - Scr 1.05, stable, Potassium stable at 3.9 today -Lasix40mg  IVdaily again today, base weight difficult to determine given BMI -Continuehome metoprolol. Hold for low BP or HR. -Strict I&O, daily weights - Wean supplemental oxygen to home dose as tolerates -Trilogy vent qhs - Trend BMP while diuresing  Chronic Lymphedema RLE Hematoma  - Wound vac in place, will need home wound vac prior to discharge -Wound care following, appreciate recs - PT/OTrecommending SNF, to which she is in opposition   VTE Ppx -Continue lovenox 40mg  BID. To be administered subcutaneously into the thigh rather than abdomen.  Dispo: Anticipated discharge in approximately 1 day(s).   Lanelle Bal, MD 08/22/2018, 8:02 AM Pager: Pager# [phone]

## 2018-08-22 NOTE — Evaluation (Addendum)
Occupational Therapy Evaluation Patient Details Name: Rachael Robertson MRN: [id number] DOB: 21-Aug-1950 Today's Date: 08/22/2018    History of Present Illness Super morbid obese 68 year old woman with chronic hypoxic and hypercapnic respiratory failure, obesity sleep apnea, oxygen dependent, chronic trach, on chronic ventilator support at night.  Nondiagnostic CT angiogram of the chest in November 2019 for pulmonary embolism, admitted with Acute on [place] combined systolic and diastolic heart failure with significant fluid overload likely related to temporary discontinuation of diuretics likely exacerbated by acute bronchitis.   Clinical Impression   PTA, pt was living alone and had an aide who assists with ADLs and IADLs coming in the morning and evening. Pt reports that niece plans on staying with pt for three weeks to assist as needed. Pt currently requiring Min-Mod A for UB ADLs, Total A for LB ADLs, and Min A +2 for functional transfers with RW. Pt presenting near baseline functional with decrease strength and activity tolerance. Pt requiring significant time throughout performance of ADLs. Recommend dc to home with HHOT for further OT to optimize safety, independence with ADLs, and return to PLOF. Further OT needs may be met at home with [facility] and acute OT needs met.     Follow Up Recommendations  Home health OT;Supervision/Assistance - 24 hour;Other (comment)(Aide)    Equipment Recommendations  None recommended by OT    Recommendations for Other Services PT consult     Precautions / Restrictions Precautions Precautions: Fall Precaution Comments: trach Required Braces or Orthoses: Other Brace Other Brace: wears shoes for standing. Wound vac to R lower leg.       Mobility Bed Mobility Overal bed mobility: Needs Assistance Bed Mobility: Sit to Supine;Supine to Sit     Supine to sit: Min assist;Mod assist Sit to supine: Max assist;+2 for physical assistance;+2 for  safety/equipment   General bed mobility comments: Min A to elevate trunk. Max A +2 for bringing BLEs over EOB. Pt then abel to position herself in bed with air placed in mattress and bed in [place]  Transfers Overall transfer level: Needs assistance Equipment used: Rolling walker (2 wheeled) Transfers: Stand Pivot Transfers;Sit to/from Stand Sit to Stand: Min assist;+2 safety/equipment;From elevated surface Stand pivot transfers: Min assist;+2 safety/equipment       General transfer comment: Min A for slight power up into standing. Then Min A for safety and RW management in pivoting to [facility]. Pt with hands on front of RW and elbows on handrest; declined to reposition stating she felt safer this way    Balance Overall balance assessment: Needs assistance Sitting-balance support: Feet supported;Feet unsupported;No upper extremity supported;Single extremity supported Sitting balance-Leahy Scale: Fair     Standing balance support: Bilateral upper extremity supported Standing balance-Leahy Scale: Poor Standing balance comment: reliant on the RW and minor external assist  for a very short period of time                           ADL either performed or assessed with clinical judgement   ADL Overall ADL's : Needs assistance/impaired Eating/Feeding: Independent;Bed level   Grooming: Set up;Supervision/safety;Sitting   Upper Body Bathing: Moderate assistance;Sitting   Lower Body Bathing: Total assistance;Sit to/from stand   Upper Body Dressing : Minimal assistance;Sitting   Lower Body Dressing: Total assistance;Sit to/from stand Lower Body Dressing Details (indicate cue type and reason): Donned socks Toilet Transfer: Minimal assistance;+2 for physical assistance;+2 for safety/equipment;Stand-pivot;RW;BSC Toilet Transfer Details (indicate cue type and reason): Min A  for safety and and slight power up into standing from [place]- Clothing Manipulation and Hygiene:  Total assistance;Sit to/from stand Toileting - Clothing Manipulation Details (indicate cue type and reason): Total A for toilet hygiene after BM     Functional mobility during ADLs: Minimal assistance;+2 for physical assistance;+2 for safety/equipment;Rolling walker(stand pivot) General ADL Comments: Pt presenting with decreased strength and activity tolerance. Required significant amount of time for performance of ADLs and transfers     Vision Baseline Vision/History: Wears glasses Wears Glasses: At all times Patient Visual Report: No change from baseline       Perception     Praxis      Pertinent Vitals/Pain Pain Assessment: Faces Faces Pain Scale: Hurts little more Pain Location: bil LE's at knees Pain Descriptors / Indicators: Aching;Burning;Discomfort Pain Intervention(s): Monitored during session;Limited activity within patient's tolerance;Repositioned     Hand Dominance Right   Extremity/Trunk Assessment Upper Extremity Assessment Upper Extremity Assessment: Generalized weakness   Lower Extremity Assessment Lower Extremity Assessment: Defer to PT evaluation   Cervical / Trunk Assessment Cervical / Trunk Assessment: Other exceptions(Obesity)   Communication Communication Communication: No difficulties;Passy-Muir valve   Cognition Arousal/Alertness: Awake/alert Behavior During Therapy: WFL for tasks assessed/performed Overall Cognitive Status: Within Functional Limits for tasks assessed                                     General Comments  Total A for toilet hygiene after BM    Exercises     Shoulder Instructions      Home Living Family/patient expects to be discharged to:: Private residence(pt's niece/family have committed to assist pt for 3 weeks) Living Arrangements: Alone Available Help at Discharge: Family;Available 24 hours/day;Available PRN/intermittently Type of Home: House Home Access: Level entry     Home Layout: One level      Bathroom Shower/Tub: Occupational psychologist: (Uses BSC)     Home Equipment: Hospital bed;Electric scooter;Walker - 2 wheels;Bedside commode;Hand held shower head;Adaptive equipment Adaptive Equipment: Reacher;Sock aid;Long-handled sponge Additional Comments: has an aid in the a.m and p.m      Prior Functioning/Environment Level of Independence: Needs assistance  Gait / Transfers Assistance Needed: lmited to stand pivots right now, electric w/c for further mobility.  ADL's / Homemaking Assistance Needed: assisted for pericare, bathing, dressing and housekeeping/meals   Comments: per pt 7x/week aide 2hour AM 2 hour PM. family PRN         OT Problem List: Decreased strength;Decreased range of motion;Decreased activity tolerance;Impaired balance (sitting and/or standing);Decreased safety awareness;Decreased knowledge of use of DME or AE;Decreased knowledge of precautions;Pain;Obesity      OT Treatment/Interventions:      OT Goals(Current goals can be found in the care plan section) Acute Rehab OT Goals Patient Stated Goal: Return home OT Goal Formulation: All assessment and education complete, DC therapy  OT Frequency:     Barriers to D/C:            Co-evaluation              AM-PAC OT "6 Clicks" Daily Activity     Outcome Measure Help from another person eating meals?: None Help from another person taking care of personal grooming?: None Help from another person toileting, which includes using toliet, bedpan, or urinal?: A Lot Help from another person bathing (including washing, rinsing, drying)?: A Lot Help from another person to put on  and taking off regular upper body clothing?: A Little Help from another person to put on and taking off regular lower body clothing?: Total 6 Click Score: 16   End of Session Equipment Utilized During Treatment: Rolling walker;Oxygen Nurse Communication: Mobility status  Activity Tolerance: Patient tolerated  treatment well Patient left: in bed;with call bell/phone within reach  OT Visit Diagnosis: Unsteadiness on feet (R26.81);Other abnormalities of gait and mobility (R26.89);Muscle weakness (generalized) (M62.81)                Time: 7711-6579 OT Time Calculation (min): 65 min Charges:  OT General Charges $OT Visit: 1 Visit OT Evaluation $OT Eval Moderate Complexity: 1 Mod OT Treatments $Self Care/Home Management : 38-52 mins  Icelynn Onken MSOT, OTR/L Acute Rehab Pager: [phone] Office: [place] 08/22/2018, 1:22 PM

## 2018-08-23 LAB — BASIC METABOLIC PANEL
Anion gap: 9 (ref 5–15)
BUN: 16 mg/dL (ref 8–23)
CO2: 36 mmol/L — ABNORMAL HIGH (ref 22–32)
Calcium: 8 mg/dL — ABNORMAL LOW (ref 8.9–10.3)
Chloride: 96 mmol/L — ABNORMAL LOW (ref 98–111)
Creatinine, Ser: 1.09 mg/dL — ABNORMAL HIGH (ref 0.44–1.00)
GFR calc Af Amer: >60 mL/min (ref >60)
GFR calc non Af Amer: 53 mL/min — ABNORMAL LOW (ref >60)
Glucose, Bld: 76 mg/dL (ref 70–99)
POTASSIUM: 3.9 mmol/L (ref 3.5–5.1)
Sodium: 141 mmol/L (ref 135–145)

## 2018-08-23 MED ORDER — SPIRONOLACTONE 25 MG PO TABS
100.0000 mg | ORAL_TABLET | Freq: Once | ORAL | Status: AC
  Administered 2018-08-23: 100 mg via ORAL
  Filled 2018-08-23: qty 4

## 2018-08-23 MED ORDER — SPIRONOLACTONE 25 MG PO TABS
100.0000 mg | ORAL_TABLET | Freq: Every day | ORAL | Status: DC
  Administered 2018-08-24: 50 mg via ORAL
  Filled 2018-08-23: qty 4

## 2018-08-23 MED ORDER — BUMETANIDE 2 MG PO TABS
4.0000 mg | ORAL_TABLET | Freq: Every day | ORAL | Status: DC
  Administered 2018-08-23: 4 mg via ORAL
  Administered 2018-08-24: 2 mg via ORAL
  Filled 2018-08-23 (×3): qty 2

## 2018-08-23 NOTE — Progress Notes (Signed)
Internal Medicine Attending:   I saw and examined the patient. I reviewed the resident's note and I agree with the resident's findings and plan as documented in the resident's note.  Patient states that she feels well today but has noted a foul smell coming from her right leg wound.  Patient is initially made to the hospital with acute on chronic hypoxic respiratory failure secondary to an acute on chronic systolic heart failure exacerbation.  Patient continues to diurese well currently with IV Lasix.  He is down 22 pounds since her admission to 444.  We will transition patient to home dose of Bumex and spironolactone today.  Continue with chronic vent for now.  Case manager follow-up and recommendations appreciated.  Patient has been accepted by a home care agency but is awaiting confirmation of 24-hour care at home as well as wound VAC approval.  Patient also noted to have a foul smell coming from her right lower extremity wound.  Will reconsult plastic surgery for evaluation of the wound.  We will hold off on antibiotics for now and follow plastic surgery input.  No further work-up at this time.

## 2018-08-23 NOTE — TOC Progression Note (Addendum)
Transition of Care [facility]) - Progression Note    Patient Details  Name: Rachael Robertson MRN: [id number] Date of Birth: [birth year]  [facility] CM/SW Contact  Cherylann Parr, RN Phone Number: 08/23/2018, 12:34 PM  Clinical Narrative:   Pt recently discharged to [facility] however pt refused to return to facility/any facility.  CM confirmed with pts niece that family continues to work on a plan for 24 hour supervision (currently are able provide care/support from 7:30am until 9:30 pm).  Pt has all required equipment in the home and pt/family wants to remain with [facility] for DME.    CM again made niece aware that 24 hour care is recommended - niece acknowledges.  Pt continues to have [facility] aide for 4 hours each day.   Niece aware that pt will need to discharge home with wound vac - CM was told that family will handle if trained.  CM again reiterated the barriers with locating an agency that will accept pt as HH - niece acknowledges but continues to perfer home as well as pt. Per medicare.gov HH pt choice list - [facility] chosen for [facility] and  [facility] has accepted the pt for [facility] agency aware of wound vac, chronic trach/vent needs (pt has triology vent at home). [employer] wound vac form has been placed on shadow chart for attending group to sign as previous wound vac has been returned to [facility].  Pt will not need ambulance home - family will pick her up in the [place].  CM confirmed with [facility] that pt has all needed equipment for trach and trilogy vent in the home.  CM also verified with pts niece and sister that they have also devised a plan for medications that incorporates delivery and adequate administration   Expected Discharge Plan: Home w Home Health Services Barriers to Discharge: (awaiting 24 hour care at home, wound vac approval).  3/16 - pts wound now looks worse and will need plastics to assess  Expected Discharge Plan and Services Expected Discharge Plan: Home w Home Health Services Discharge Planning  Services: CM Consult   Living arrangements for the past 2 months: Skilled Nursing Facility, Single Family Home                           Social Determinants of Health (SDOH) Interventions    Readmission Risk Interventions 30 Day Unplanned Readmission Risk Score     ED to Hosp-Admission (Current) from [facility]/18/2018 in [place] [facility]  30 Day Unplanned Readmission Risk Score (%)  23 Filed at 08/23/2018 1200     This score is the patient's risk of an unplanned readmission within 30 days of being discharged (0 -100%). The score is based on dignosis, age, lab data, medications, orders, and past utilization.   Low:  0-14.9   Medium: 15-21.9   High: 22-29.9   Extreme: 30 and above       No flowsheet data found.

## 2018-08-23 NOTE — Progress Notes (Signed)
Physical Therapy Treatment Patient Details Name: Rachael Robertson MRN: [id number] DOB: 10/18/1950 Today's Date: 08/23/2018    History of Present Illness Super morbid obese 68 year old woman with chronic hypoxic and hypercapnic respiratory failure, obesity sleep apnea, oxygen dependent, chronic trach, on chronic ventilator support at night.  Nondiagnostic CT angiogram of the chest in November 2019 for pulmonary embolism, admitted with Acute on [place] combined systolic and diastolic heart failure with significant fluid overload likely related to temporary discontinuation of diuretics likely exacerbated by acute bronchitis.    PT Comments    Session focused on bed level therex, patient states her knee is not steady when standing up and is awaiting news from team on if there is infection? Foul odor coming from wound vac. Patient affirms she will have increased help at home and states she had a poor experience at SNF recently .     Follow Up Recommendations  Home health PT;Other (comment)     Equipment Recommendations  None recommended by PT    Recommendations for Other Services       Precautions / Restrictions Precautions Precautions: Fall Restrictions Weight Bearing Restrictions: No    Mobility  Bed Mobility               General bed mobility comments: pt deferred today   Transfers                    Ambulation/Gait                 Stairs             Wheelchair Mobility    Modified Rankin (Stroke Patients Only)       Balance Overall balance assessment: Needs assistance Sitting-balance support: Feet supported;Feet unsupported;No upper extremity supported;Single extremity supported Sitting balance-Leahy Scale: Fair     Standing balance support: Bilateral upper extremity supported Standing balance-Leahy Scale: Poor                              Cognition Arousal/Alertness: Awake/alert Behavior During Therapy: WFL for tasks  assessed/performed Overall Cognitive Status: Within Functional Limits for tasks assessed                                 General Comments: pt excellent at directing her care, cannot multitask      Exercises General Exercises - Upper Extremity Shoulder Flexion: 20 reps Shoulder ABduction: 20 reps Shoulder ADduction: 20 reps Elbow Flexion: 20 reps Elbow Extension: 15 reps General Exercises - Lower Extremity Ankle Circles/Pumps: 20 reps Quad Sets: 20 reps Heel Slides: 20 reps Hip ABduction/ADduction: 20 reps    General Comments        Pertinent Vitals/Pain Pain Assessment: Faces Faces Pain Scale: Hurts little more Pain Location: bil LE's at knees Pain Descriptors / Indicators: Aching;Burning;Discomfort Pain Intervention(s): Limited activity within patient's tolerance;Monitored during session;Premedicated before session    Home Living                      Prior Function            PT Goals (current goals can now be found in the care plan section) Acute Rehab PT Goals Patient Stated Goal: Return home PT Goal Formulation: With patient Time For Goal Achievement: 08/11/18 Potential to Achieve Goals: Fair Progress towards PT goals: Progressing toward goals  Frequency    Min 3X/week      PT Plan Current plan remains appropriate    Co-evaluation              AM-PAC PT "6 Clicks" Mobility   Outcome Measure  Help needed turning from your back to your side while in a flat bed without using bedrails?: A Little Help needed moving from lying on your back to sitting on the side of a flat bed without using bedrails?: A Little Help needed moving to and from a bed to a chair (including a wheelchair)?: A Lot Help needed standing up from a chair using your arms (e.g., wheelchair or bedside chair)?: A Little Help needed to walk in hospital room?: Total Help needed climbing 3-5 steps with a railing? : Total 6 Click Score: 13    End of Session  Equipment Utilized During Treatment: Oxygen Activity Tolerance: Patient limited by fatigue;Patient limited by pain Patient left: in bed;with call bell/phone within reach;with bed alarm set Nurse Communication: Mobility status PT Visit Diagnosis: Other abnormalities of gait and mobility (R26.89);Pain;Muscle weakness (generalized) (M62.81) Pain - part of body: Leg     Time: 1500-1520 PT Time Calculation (min) (ACUTE ONLY): 20 min  Charges:  $Therapeutic Exercise: 8-22 mins                     Etta Grandchild, PT, DPT Acute Rehabilitation Services Pager: [phone] Office: [phone]     Etta Grandchild 08/23/2018, 3:53 PM

## 2018-08-23 NOTE — Progress Notes (Signed)
Subjective: No overnight events. Rachael Robertson feels well today except for her right leg. She states her leg feels sore and has a foul smell. We discussed her disposition today. She continues to decline SNF and would like to go home. Case management has been in touch with her niece who will be caring for her at home. They have care for her from 7:30am to 9:30pm, and are working on making this 24 hours a day. All of the patient's questions were answered.   Objective:  Vital signs in last 24 hours: Vitals:   08/23/18 0414 08/23/18 0428 08/23/18 0500 08/23/18 0600  BP: (!) 99/46     Pulse: (!) 55  (!) 56 (!) 54  Resp: 17  17 18   Temp:      TempSrc:      SpO2: 99%  100% 100%  Weight:  (!) 201.4 kg    Height:       Gen: obese female, no distress CV: RRR, no murmurs Pulm: CTAB in the anterior lung fields Abd: soft, non-distended, non-tender Ext: RLE wound with wound vac in place, malodorous compared to prior exams. Increased warmth and erythema stable since admission. Nonpitting BLE edema.   Assessment/Plan:  Principal Problem:   Acute on chronic respiratory failure with hypoxia (HCC) Active Problems:   OSA (obstructive sleep apnea)   Tracheostomy status (HCC)   Acute on chronic systolic heart failure (HCC)   Morbid obesity (HCC)   Hx of pulmonary embolus   Abdominal wall hematoma  Rachael Robertson is a67 y.ofemalewith a PMHx notable for chronic hypercapnea 2/2 OHS, OSA on chronic trach (on 2L supplemental O2 viatrach collar during the day andtrilogy ventilator qhs), HFrEF (EF 45-50%), CKDIII,history of PE, no longer on anticoagulation after hematoma of RLE required I&D last month,who presented with 3 days of progressivedyspneaand was subsequently found to have acute on chronic hypoxic respiratory failure with pulmonary edema. She has improved notably with regard to her respiratory symptoms after a prolonged stay.   Acute on Chronic Hypoxic Respiratory Failure  Pulmonary  Eedema 2/2 Acute on Chronic Systolic Heart Failure OSA/OHS Tracheostomy Dependent - Was previously taken off of spironolactone and Bumex on a prior discharge. She has demonstrated that she needs continued diuresis to avoid fluid retention. - Currently on5Lby trach collar but weaning. Home oxygen requirement is 2L by trach collar during the day. - Diuresed 2.8L with 40mg  IV lasix once yesterday. She has lost 22lbs during the hospitalization from 466 to 444. However, it remains difficult to assess her base weight and volume status clinically. - Her kidney function and potassium are stable today.  Plan - Transition to PO bumex 4mg  daily and spironolactone 100mg  daily (her previous medications and doses) -Continuehome metoprolol. Hold for low BP or HR. -Strict I&O, daily weights - Wean supplemental oxygen to home dose as tolerates -Trilogy vent qhs - Trend BMP while diuresing  Chronic Lymphedema RLE Hematoma  - Wound vac in place, will need home wound vac prior to discharge - Malodorous wound today compared to prior with increased pain. Will consult plastics for assessment of the wound.  Plan - Plastic surgery consulted, appreciate recs - Continue wound vac    VTE Ppx -Continue lovenox 40mg  BID. To be administered subcutaneously into the thigh rather than abdomen.  Dispo: PT/OT recommending SNF. Patient requires 24-hour care given her poor mobility and ventilator requirement. However, she would prefer to pursue this at home rather than at a SNF. Her niece and other family members plan  to care for her. They are already trained on the vent and are willing to train on the wound vac. They have care in place 14 hours of the day, and are working on securing 24 hour coverage. Anticipated discharge home in 1-2 days.   Rachael Robertson, Rachael Corti, MD 08/23/2018, 6:11 AM Pager: [phone]

## 2018-08-23 NOTE — Care Management (Signed)
CM contacted [employer] rep - rep to follow back up with CM for clarity regarding if an additional wound vac is required (pt had wound vac at facility).  Additionally, a [employer] wound vac form has been placed on shadow chart in case pt discharges home and a new wound vac is required - attending is aware.  Attending and CM share concerns with pt returning home at discharge as care provided at home prior was not adequate.  Currently pt refusing to return to [facility].

## 2018-08-24 ENCOUNTER — Telehealth: Payer: Self-pay | Admitting: Plastic Surgery

## 2018-08-24 LAB — BASIC METABOLIC PANEL
ANION GAP: 9 (ref 5–15)
BUN: 14 mg/dL (ref 8–23)
CO2: 37 mmol/L — ABNORMAL HIGH (ref 22–32)
Calcium: 8.2 mg/dL — ABNORMAL LOW (ref 8.9–10.3)
Chloride: 94 mmol/L — ABNORMAL LOW (ref 98–111)
Creatinine, Ser: 1.12 mg/dL — ABNORMAL HIGH (ref 0.44–1.00)
GFR calc Af Amer: 60 mL/min — ABNORMAL LOW (ref >60)
GFR calc non Af Amer: 52 mL/min — ABNORMAL LOW (ref >60)
Glucose, Bld: 86 mg/dL (ref 70–99)
Potassium: 4 mmol/L (ref 3.5–5.1)
Sodium: 140 mmol/L (ref 135–145)

## 2018-08-24 MED ORDER — SPIRONOLACTONE 25 MG PO TABS: 50.0000 mg | ORAL_TABLET | Freq: Every day | ORAL | Status: DC

## 2018-08-24 MED ORDER — BUMETANIDE 2 MG PO TABS
2.0000 mg | ORAL_TABLET | Freq: Every day | ORAL | Status: DC
  Filled 2018-08-24: qty 1

## 2018-08-24 NOTE — Progress Notes (Signed)
SATURATION QUALIFICATIONS: (This note is used to comply with regulatory documentation for home oxygen)  Patient Saturations on Room Air at Rest = 87%  Patient Saturations on Room Air while Ambulating = 0% NA  Patient Saturations on 8 Liters of oxygen while Ambulating = 94%  Please briefly explain why patient needs home oxygen:  Pt bedbound. Unable to ambulate. Unable to tolerate trach without trach collar.

## 2018-08-24 NOTE — Progress Notes (Signed)
Placed patient on BIPAP (AVAPS) per order for HS use. Tolerating well at this time.

## 2018-08-24 NOTE — Progress Notes (Signed)
This note also relates to the following rows which could not be included: Pulse Rate - Cannot attach notes to unvalidated device data ECG Heart Rate - Cannot attach notes to unvalidated device data Resp - Cannot attach notes to unvalidated device data   Room Air Sat.   08/24/18 1431  Oxygen Therapy  SpO2 (!) 87 %

## 2018-08-24 NOTE — Procedures (Signed)
Preoperative Dx: right knee wound  Postoperative Dx: Same  Procedure: preparation of right knee wound 5 x 7 cm for placement of Acell (500 mg powder and 7 x 10 cm sheet) and VAC  Surgeon: Dr. Alan Ripper Dillingham  Assistand: Enedina Finner, RNFA  Indication for Procedure: right knee wound  Description of Procedure: Risks and complications were explained to the patient.  Consent was confirmed.  Time out was called and all information was confirmed to be correct.  The area was prepped.  The Acell was prepared with saline.  All of the acell sheet and powder was applied to the right knee wound.   The sorbact was applied on top.  The VAC was applied and had an excellent seal at 125 mmHg pressure. The patient is to follow up in one month.  She tolerated the procedure well and there were no complications.

## 2018-08-24 NOTE — Progress Notes (Signed)
The patient has chronic respiratory failure secondary to obstructive sleep apnea and obesity hypoventilation syndrome. I am ordering a ventilator to sustain life and prevent recurrent hospitalizations.   I am also ordering a home wound vac to facilitate the healing of her right lower extremity wound.

## 2018-08-24 NOTE — Discharge Summary (Signed)
Name: Rachael Robertson MRN: [id number] DOB: 1950/10/21 68 y.o. PCP: Lorenso Courier, MD  Date of Admission: 08/18/2018  1:22 AM Date of Discharge: 08/25/2018 Attending Physician: Dr. Heide Spark  Discharge Diagnosis: 1. Acute on chronic systolic heart failure 2. Tracheostomy dependent 2/2 OSA/HS 3. RLE hematoma s/p I&D  Discharge Medications: Allergies as of 08/25/2018      Reactions   Other Anaphylaxis   Melons   Fish Allergy Diarrhea   Fruit & Vegetable Daily [nutritional Supplements] Diarrhea   Have to be cooked or vine-ripened   Lactose Intolerance (gi) Diarrhea   Naproxen Other (See Comments)   Makes her "congested"   Sulfa Antibiotics Hives   Erythromycin Hives, Rash   Latex Rash   Mupirocin Rash   Tape Rash, Other (See Comments)   Plastic tape (alo) BURNS, but Tegaderm is tolerated      Medication List    STOP taking these medications   metoprolol tartrate 25 MG tablet Commonly known as:  LOPRESSOR     TAKE these medications   acetaminophen 650 MG CR tablet Commonly known as:  TYLENOL Take 1,300 mg by mouth 2 (two) times daily.   albuterol 108 (90 Base) MCG/ACT inhaler Commonly known as:  PROVENTIL HFA;VENTOLIN HFA Inhale 1-2 puffs into the lungs every 6 (six) hours as needed for wheezing or shortness of breath.   allopurinol 100 MG tablet Commonly known as:  ZYLOPRIM Take 100 mg by mouth 2 (two) times daily.   budesonide-formoterol 160-4.5 MCG/ACT inhaler Commonly known as:  SYMBICORT Inhale 2 puffs into the lungs 2 (two) times daily.   bumetanide 2 MG tablet Commonly known as:  Bumex Take 1 tablet (2 mg total) by mouth daily.   colchicine 0.6 MG tablet Take 1 tablet (0.6 mg total) by mouth daily.   Daily Probiotic Caps Take 1 tablet by mouth daily.   diclofenac sodium 1 % Gel Commonly known as:  VOLTAREN Apply 2 g topically 4 (four) times daily. What changed:    how much to take  when to take this  additional instructions   ferrous  sulfate 325 (65 FE) MG tablet Take 1 tablet (325 mg total) by mouth daily with breakfast.   fluconazole 150 MG tablet Commonly known as:  DIFLUCAN Take 1 tablet (150 mg total) by mouth once a week. What changed:  when to take this   Gerhardt's butt cream Crea Apply 1 application topically 2 (two) times daily.   guaiFENesin 600 MG 12 hr tablet Commonly known as:  MUCINEX Take 1,200 mg by mouth every 12 (twelve) hours.   ipratropium-albuterol 0.5-2.5 (3) MG/3ML Soln Commonly known as:  DUONEB Take 3 mLs by nebulization every 6 (six) hours as needed (sob/wheezing).   levothyroxine 50 MCG tablet Commonly known as:  SYNTHROID, LEVOTHROID Take 50 mcg by mouth daily before breakfast.   loperamide 2 MG tablet Commonly known as:  IMODIUM A-D Take 2 mg by mouth 4 (four) times daily as needed for diarrhea or loose stools.   omeprazole 20 MG capsule Commonly known as:  PRILOSEC Take 20 mg by mouth daily.   potassium chloride SA 20 MEQ tablet Commonly known as:  K-DUR,KLOR-CON Take 40 mEq by mouth daily.   pramipexole 1 MG tablet Commonly known as:  MIRAPEX Take 1 mg by mouth 2 (two) times daily.   silver sulfADIAZINE 1 % cream Commonly known as:  SILVADENE Apply topically daily. What changed:  how much to take   spironolactone 50 MG tablet Commonly known as:  Aldactone Take 1 tablet (50 mg total) by mouth daily.   traMADol 50 MG tablet Commonly known as:  ULTRAM Take 1 tablet (50 mg total) by mouth every 6 (six) hours as needed for moderate pain.            Durable Medical Equipment  (From admission, onward)         Start     Ordered   08/25/18 1447  For home use only DME Negative pressure wound device  Once    Comments:  Start 3/19. Wound measures 5 x 7 x 1 cm.  Question Answer Comment  Frequency of dressing change 2 times per week   Length of need 3 Months   Dressing type Foam   Amount of suction 125 mm/Hg   Pressure application Continuous pressure    Supplies 10 canisters and 15 dressings per month for duration of therapy      08/25/18 1448   08/25/18 1218  For home use only DME oxygen  Once    Comments:  Administered via trach collar  Question Answer Comment  Mode or (Route) Mask   Liters per Minute 5   Frequency Continuous (stationary and portable oxygen unit needed)   Oxygen delivery system Gas      08/25/18 1219          Disposition and follow-up:   Rachael Robertson was discharged from [facility] in Good condition.  At the hospital follow up visit please address:  1. Acute on chronic systolic heart failure - Her home diuretics had been discontinued during her previous hospitalization. She accumulated fluid while off of these.  - Resumed previous diuretics at lower dose: spironolactone 50mg  daily and bumex 2mg  daily. Was previously on spironolactone 100mg  daily and bumex 4mg  daily. - Please monitor patient's volume status and BMP for potassium and kidney function - Dry weight 444lbs  2. RLE hematoma s/p I&D - Wound vac in place on discharge - Please ensure appropriate f/u with plastic surgery  3. Discontinued metoprolol given soft blood pressures and heart rates - Please consider resuming this medication at a low dose if vitals tolerate  4. Please give patient ambulatory referral to cardiology to establish care  5.  Labs / imaging needed at time of follow-up: BMP  6.  Pending labs/ test needing follow-up: none  Follow-up Appointments: Follow-up Information    Care, [facility] Follow up.   Why:  Home Health Nurse Contact information: [address] [place] [address] [phone]        [facility] Follow up.   Why:  [employer] and trach supplies Contact information: [address] [place] [address] [phone]        [facility] Follow up.   Why:  wound vac          Hospital Course by problem list: 1. Acute on chronic systolic heart failure: Rachael Robertson is a67 y.ofemalewitha PMHx notable forchronic hypercapnea 2/2 OHS, OSA on chronic trach (on 2L supplemental O2 viatrach collar during the day andtrilogy ventilator qhs), HFrEF (EF 45-50%), CKDIII,history of PE, no longer on anticoagulation after hematoma of RLE required I&D last month,who presented with 3 days of progressivedyspneaand was subsequently found to have acute on chronic hypoxic respiratory failure with pulmonary edema.This exacerbation was caused because her diuretics had been discontinued during a prior hospitalization and because she was unable to regulate her salt intake at the SNF she came from. She was treated with IV lasix  and diuresed 19L total. Weight decreased 22lbs. Dry weight was 444lbs. She was resumed on her previous home diuretics at a decreased dose. Her kidney function was stable during hospitalization. Patient declined SNF placement and was discharged home with family support. She will f/u in the [facility].   2. Tracheostomy dependent 2/2 OSA/HS: Patient continued on home ventilator at night. Wears oxygen by trach collar during the day.   3. RLE hematoma s/p I&D: Evaluated by plastic surgery. No suspicion for infection. Continued on wound vac and discharged home with wound vac in place. Patient will f/u with plastic surgery.  Discharge Vitals:   BP (!) 98/38   Pulse (!) 49   Temp 98.3 F (36.8 C) (Oral)   Resp 18   Ht 5' (1.524 m)   Wt (!) 203.7 kg   SpO2 97%   BMI 87.69 kg/m   Pertinent Labs, Studies, and Procedures:  CBC Latest Ref Rng & Units 08/18/2018 08/02/2018 08/01/2018  WBC 4.0 - 10.5 K/uL 4.1 4.8 5.5  Hemoglobin 12.0 - 15.0 g/dL 7.9(L) 8.0(L) 7.5(L)  Hematocrit 36.0 - 46.0 % 28.5(L) 27.1(L) 26.1(L)  Platelets 150 - 400 K/uL 151 190 177   CMP Latest Ref Rng & Units 08/25/2018 08/24/2018 08/23/2018  Glucose 70 - 99 mg/dL 83 86 76  BUN 8 - 23 mg/dL 17 14 16   Creatinine 0.44 - 1.00 mg/dL 0.94(B) 0.96(G) 8.36(O)  Sodium 135 - 145 mmol/L 142 140  141  Potassium 3.5 - 5.1 mmol/L 3.8 4.0 3.9  Chloride 98 - 111 mmol/L 92(L) 94(L) 96(L)  CO2 22 - 32 mmol/L 43(H) 37(H) 36(H)  Calcium 8.9 - 10.3 mg/dL 2.9(U) 7.6(L) 4.6(T)  Total Protein 6.5 - 8.1 g/dL - - -  Total Bilirubin 0.3 - 1.2 mg/dL - - -  Alkaline Phos 38 - 126 U/L - - -  AST 15 - 41 U/L - - -  ALT 0 - 44 U/L - - -   CXR 08/18/2018 Cardiomegaly with vascular congestion and mild perihilar edema. Enlarged appearing central pulmonary vessels suggesting arterial Hypertension.  CXR 08/21/2018 Cardiomegaly with pulmonary vascular congestion.  Discharge Instructions: Discharge Instructions    Diet - low sodium heart healthy   Complete by:  As directed    Discharge instructions   Complete by:  As directed    Rachael Robertson,  You were hospitalized for a heart failure exacerbation. You were treated with IV diuretics. Please start taking spironolactone 50mg  daily and bumex 2mg  daily tomorrow.  Continue using the wound vac for your leg wound at home. We have ordered a home health nurse to help you with that. Follow-up with Dr. Ulice Bold in one month.  Your metoprolol has been discontinued because your HR was consistently on the lower end of normal.  Please follow-up with the internal medicine clinic on Monday.  Feel free to call our clinic if you have any questions.  Thanks, Dr. Avie Arenas   Increase activity slowly   Complete by:  As directed       Signed: Dorrell, [name], MD 08/25/2018, 4:06 PM   Pager: [phone]

## 2018-08-24 NOTE — Care Management (Signed)
CM again request that [employer] wound vac form be filled out by MD - approval for wound vac can not be obtained until form is completed and faxed to [facility]

## 2018-08-24 NOTE — TOC Progression Note (Addendum)
Transition of Care [facility]) - Progression Note    Patient Details  Name: Rachael Robertson MRN: [id number] Date of Birth: 10-09-1950  Transition of Care [facility]) CM/SW Contact  Melvinia Ashby, Manfred Arch, RN Phone Number: 08/24/2018, 1:57 PM  Clinical Narrative:   CM contacted by Patsy Lager, pts sister Lyla Son on behalf of pt reached out to liason and informed company that she wants to discontinue DME with [facility] and initate DME with [facility].  CM contacted sister and sister confirmed -  CM requested [facility] to contact sister in an attempt to address concerns, however [facility] was unable to do so.  Attending informed of change with discharge plan - attending to follow up with [facility] directly to determine what orders are needed to ensure continuation of care at discharge regarding oxygen needs including triology /jtrach equipment.  CM informed [facility] that pt will discontinue services    Expected Discharge Plan: Home w Home Health Services Barriers to Discharge: (awaiting 24 hour care at home, wound vac approval)  Expected Discharge Plan and Services Expected Discharge Plan: Home w Home Health Services Discharge Planning Services: CM Consult   Living arrangements for the past 2 months: Skilled Nursing Facility, Single Family Home                           Social Determinants of Health (SDOH) Interventions    Readmission Risk Interventions 30 Day Unplanned Readmission Risk Score     ED to Hosp-Admission (Current) from 08/18/2018 in [facility]  30 Day Unplanned Readmission Risk Score (%)  24 Filed at 08/24/2018 1200     This score is the patient's risk of an unplanned readmission within 30 days of being discharged (0 -100%). The score is based on dignosis, age, lab data, medications, orders, and past utilization.   Low:  0-14.9   Medium: 15-21.9   High: 22-29.9   Extreme: 30 and above       No flowsheet data found.

## 2018-08-24 NOTE — Progress Notes (Signed)
Subjective: No overnight events. A nurse from plastic surgery is at bedside today changing Rachael Robertson's wound vac. [name] reports that she feels well today other than pain in her right leg with dressing changes. She continues to desire discharge home rather than to a facility. She discussed her care plan at home with the medical team and reports that she feels comfortable with it. All of her questions were answered.  Objective:  Vital signs in last 24 hours: Vitals:   08/23/18 2215 08/24/18 0001 08/24/18 0007 08/24/18 0345  BP: (!) 99/43  (!) 102/41   Pulse: 60 (!) 59 62 60  Resp: 16 (!) 22 (!) 24 15  Temp:      TempSrc:      SpO2: 99% 99% 99% 99%  Weight:      Height:       Gen: obese female, no distress CV: RRR, no murmurs Pulm: CTAB in the anterior lung fields Ext: RLE wound with active scant bleeding, no pustular drainage, no associated scent today.  Assessment/Plan:  Principal Problem:   Acute on chronic respiratory failure with hypoxia (HCC) Active Problems:   OSA (obstructive sleep apnea)   Tracheostomy status (HCC)   Acute on chronic systolic heart failure (HCC)   Morbid obesity (HCC)   Hx of pulmonary embolus   Abdominal wall hematoma  Rachael Robertson is a67 y.ofemalewitha PMHx notable forchronic hypercapnea 2/2 OHS, OSA on chronic trach (on 2L supplemental O2 viatrach collar during the day andtrilogy ventilator qhs), HFrEF (EF 45-50%), CKDIII,history of PE, no longer on anticoagulation after hematoma of RLE required I&D last month,who presented with 3 days of progressivedyspneaand was subsequently found to have acute on chronic hypoxic respiratory failure with pulmonary edema.She has improved notably with regard to her respiratory symptoms after a prolonged stay.   Acute on Chronic Hypoxic Respiratory Failure  Pulmonary Eedema 2/2 Acute on Chronic Systolic Heart Failure OSA/OHS Tracheostomy Dependent - At home, she uses supplemental oxygen by  trach collar during the day and a ventilator at night.  - Diuresed 3L with PO bumex and spironolactone yesterday. She has lost 22lbs during the hospitalization overall. However, it remains difficult to assess her base weight and volume status clinically. - Her kidney function and potassium are stable today.  Plan - Decrease to bumex 2mg  daily and spironolactone 50mg  daily  -Discontinue home metoprolol for soft blood pressures and HRs -Strict I&O, daily weights -Continue supplemental oxygen by trach collar during the day and by trilogy vent qhs - Trend BMP while diuresing  Chronic Lymphedema RLE Hematoma - Wound vac changed today by plastic surgery. Plastic surgery with no concern for infection.  - Patient will be medically stable for discharge home once home wound vac has been approved. Plan - Plastic surgery consulted, appreciate recs - Case management consulted for wound vac authorization, appreciate assistance - Continue wound vac    VTE Ppx -Continue lovenox 40mg  BID. To be administered subcutaneously into the thigh rather than abdomen.  Dispo: PT/OT recommending SNF. Patient requires 24-hour care given her poor mobility and ventilator requirement. However, she would prefer to pursue this at home rather than at a SNF. Her niece and other family members plan to care for her. They are already trained on the vent and are willing to train on the wound vac. They have care in place 14 hours of the day, and are working on securing 24 hour coverage. Patient is medically stable for discharge. Awaiting wound vac approval.   Dispo:  Anticipated discharge in approximately 0-1 day(s).   Jamill Wetmore, [name], MD 08/24/2018, 6:23 AM Pager: [phone]

## 2018-08-24 NOTE — Telephone Encounter (Signed)
Patient called and left message stating that her wound vac had a foul smelling odor and would like a call back from clinical staff.

## 2018-08-25 ENCOUNTER — Other Ambulatory Visit (HOSPITAL_COMMUNITY): Payer: Self-pay | Admitting: Internal Medicine

## 2018-08-25 DIAGNOSIS — Z91013 Allergy to seafood: Secondary | ICD-10-CM

## 2018-08-25 DIAGNOSIS — Z6841 Body Mass Index (BMI) 40.0 and over, adult: Secondary | ICD-10-CM

## 2018-08-25 LAB — BASIC METABOLIC PANEL
Anion gap: 7 (ref 5–15)
BUN: 17 mg/dL (ref 8–23)
CO2: 43 mmol/L — AB (ref 22–32)
Calcium: 8.4 mg/dL — ABNORMAL LOW (ref 8.9–10.3)
Chloride: 92 mmol/L — ABNORMAL LOW (ref 98–111)
Creatinine, Ser: 1.23 mg/dL — ABNORMAL HIGH (ref 0.44–1.00)
GFR calc Af Amer: 53 mL/min — ABNORMAL LOW (ref >60)
GFR calc non Af Amer: 46 mL/min — ABNORMAL LOW (ref >60)
Glucose, Bld: 83 mg/dL (ref 70–99)
Potassium: 3.8 mmol/L (ref 3.5–5.1)
Sodium: 142 mmol/L (ref 135–145)

## 2018-08-25 LAB — MAGNESIUM: Magnesium: 1.6 mg/dL — ABNORMAL LOW (ref 1.7–2.4)

## 2018-08-25 MED ORDER — MAGNESIUM SULFATE 2 GM/50ML IV SOLN
2.0000 g | Freq: Once | INTRAVENOUS | Status: AC
  Administered 2018-08-25: 2 g via INTRAVENOUS
  Filled 2018-08-25: qty 50

## 2018-08-25 MED ORDER — BUMETANIDE 2 MG PO TABS: 2.0000 mg | ORAL_TABLET | Freq: Every day | ORAL | 0 refills | Status: AC

## 2018-08-25 MED ORDER — SPIRONOLACTONE 50 MG PO TABS: 50.0000 mg | ORAL_TABLET | Freq: Every day | ORAL | 0 refills | Status: AC

## 2018-08-25 NOTE — Progress Notes (Signed)
Reviewed discharge instructions with patient, patients sister, and patients niece.  Walked patient out in personal wheelchair to vehicle with family members.  Pt left with wound vac dressing still in place.  Pt and family thoroughly educated before leaving about not voiding since 10am when foley was taken out, she stated "she would figure it out at home she has ways to urinate" MD made aware.

## 2018-08-25 NOTE — Care Management (Signed)
Pt is on a heart diet - per attending diet is adequate to promote healing.

## 2018-08-25 NOTE — Progress Notes (Signed)
SATURATION QUALIFICATIONS: (This note is used to comply with regulatory documentation for home oxygen)  Patient Saturations on Room Air at Rest = 83%  Patient Saturations on 5L and 28% on trach collar at rest=97%  Please briefly explain why patient needs home oxygen: pt has tracheostomy and needs continuous oxygen unable to tolerate trach without trach collar oxygen

## 2018-08-25 NOTE — Progress Notes (Signed)
Subjective: No overnight events. Rachael Robertson reports that she is feeling well. She states that she uses 2L of supplemental oxygen by trach collar at home during the day and 4L at night with her vent. However, she states she uses a different valve in the hospital, so her oxygen requirement is usually 5L during the day and 8L at night. It was explained to Rachael Robertson that the medical team and case management is still coordinating all of her home health requirements before discharge. She understands and all of her questions were answered.  Objective:  Vital signs in last 24 hours: Vitals:   08/25/18 0300 08/25/18 0339 08/25/18 0400 08/25/18 0406  BP: (!) 101/43  (!) 96/44   Pulse: (!) 49  (!) 50   Resp: 16  (!) 21   Temp:  98.6 F (37 C)    TempSrc:  Oral    SpO2: 100%  100%   Weight:    (!) 203.7 kg  Height:       Gen: obese female, no distress CV: RRR, no murmurs Pulm: CTAB in the anterior lung fields Ext: RLE wound with wound vac in place, bilateral non-pitting edema  Assessment/Plan:  Principal Problem:   Acute on chronic respiratory failure with hypoxia (HCC) Active Problems:   OSA (obstructive sleep apnea)   Tracheostomy status (HCC)   Acute on chronic systolic heart failure (HCC)   Morbid obesity (HCC)   Hx of pulmonary embolus   Abdominal wall hematoma  Rachael Robertson is a67 y.ofemalewitha PMHx notable forchronic hypercapnea 2/2 OHS, OSA on chronic trach (on 2L supplemental O2 viatrach collar during the day andtrilogy ventilator qhs), HFrEF (EF 45-50%), CKDIII,history of PE, no longer on anticoagulation after hematoma of RLE required I&D last month,who presented with 3 days of progressivedyspneaand was subsequently found to have acute on chronic hypoxic respiratory failure with pulmonary edema.Shehasimproved notably with regard to her respiratory symptoms after a prolonged stay.   Acute on Chronic Hypoxic Respiratory Failure  Pulmonary Eedema 2/2 Acute on  Chronic Systolic Heart Failure OSA/OHS Tracheostomy Dependent -At home, she uses supplemental oxygen by trach collar during the day and a ventilator at night.  - Diuresed 1.7L with PO bumex and spironolactone yesterday, net negative 20L since admission. However, it remains difficult to assess her base weight and volume status clinically. - Cr with a slight increase from 1.12 to 1.23 (BL is about 1.1). Bicarb elevated to 43. Plan - Will hold diuretics today given increased Cr. Resume at reduced dose tomorrow with bumex 2mg  daily and spironolactone 50mg  daily. -Strict I&O, daily weights -Continue supplemental oxygen by trach collar during the day and by trilogy vent qhs - Trend BMP while diuresing  Chronic Lymphedema RLE Hematoma - Wound vac changed 3/17 by plastic surgery. Plastic surgery with no concern for infection.  - Patient will be medically stable for discharge home once home wound vac has been approved. Plan - Plastic surgery consulted, appreciate recs - Continue wound vac  VTE Ppx -Continue lovenox 40mg  BID. To be administered subcutaneously into the thigh rather than abdomen.  Dispo:PT/OT recommending SNF. Patient requires 24-hour care given her poor mobility and ventilator requirement. However, she would prefer to pursue this at home rather than at a SNF. Her niece and other family members plan to care for her. They are already trained on the vent and are willing to train on the wound vac. They have care in place 14 hours of the day, and are working on securing 24 hour  coverage. Patient is medically stable for discharge. However, her family would like all of her home health supplies to be transitioned to [facility]. Awaiting home oxygen, vent, and wound vac authorization and home delivery.  Rachael Robertson, Rachael Corti, MD 08/25/2018, 6:09 AM Pager: [phone]

## 2018-08-25 NOTE — Care Management Important Message (Signed)
Important Message  Patient Details  Name: Rachael Robertson MRN: [id number] Date of Birth: 1951-03-30   Medicare Important Message Given:  Yes    Cherylann Parr, RN 08/25/2018, 4:19 PM

## 2018-08-25 NOTE — Progress Notes (Signed)
Pt refused to change wound dressing from wound vac dressing to wet to dry per order for discharge, wound vac disconnected with dressing still on.

## 2018-08-25 NOTE — TOC Transition Note (Addendum)
Transition of Care [facility]) - CM/SW Discharge Note   Patient Details  Name: Rachael Robertson MRN: [id number] Date of Birth: 04-20-1951  Transition of Care [facility]) CM/SW Contact:  Cherylann Parr, RN Phone Number: 08/25/2018, 3:35 PM   Clinical Narrative:   Pt to discharge home today. Pt/family continues to decline all HH with the exception of HHR[facility] for training on wound vac.  Family confirms that they will provide all required needs including all needed supervision at discharge.    Final next level of care: Home w Home Health Services Barriers to Discharge: Barriers Resolved   Patient Goals and CMS Choice Patient states their goals for this hospitalization and ongoing recovery are:: to return home   Choice offered to / list presented to : Patient  Discharge Placement                       Discharge Plan and Services Discharge Planning Services: CM Consult Post Acute Care Choice: Durable Medical Equipment, Home Health          DME Arranged: (wound vac supplied by [employer] and oxygen/trach supplies/triology will be supplied by [facility]) DME Agency: [facility] HH Arranged: RN HH Agency: [facility]   Social Determinants of Health (SDOH) Interventions     Readmission Risk Interventions No flowsheet data found.

## 2018-08-25 NOTE — Telephone Encounter (Signed)
Called and spoke with the patient on (08/24/18) regarding the message below.  Patient stated that the issue was handled this morning.  Asked the patient if home health come out to check the wound vac, and she stated that Dr. Ulice Bold come by to see her this morning and she fixed it.  Asked the patient if she was in the hospital, and she stated that she was.//AB/CMA

## 2018-08-25 NOTE — TOC Progression Note (Addendum)
Transition of Care [facility]) - Progression Note    Patient Details  Name: Rachael Robertson MRN: [id number] Date of Birth: Apr 23, 1951  Transition of Care [facility]) CM/SW Contact  Riane Rung, Manfred Arch, RN Phone Number: 08/25/2018, 2:47 PM  Clinical Narrative:   Required documentation/orders for switch to [facility] for home oxygen/trach/trilogy vent needs have been agreed up by attending group and [employer] liaison.  Attending group to fax all required information back to [employer] today.  Unfortunately - [employer] form was never completed by Plastics, [facility] also provided option for wound vac - pt informed CM that she would prefer to use [employer] for vac if possible.  Attending group  has [employer] wound vac form and will complete and fax directly back to [facility] - attending group to ensure following group is in agreement with [employer].     Update  0826::  [facility] will deliver oxygen equipment including triology today - deliver resource will train family at deliver.  [facility] accepted referral of Wound vac, vac will be delivered tomorrow am to pts home.  CM confirmed with [facility] that [facility] will come to home tomorrow; place wound vac and train family.  Attending in agreement for pt to discharge with wet to dry dressing and have wound vac applied tomorrow.    Update 1400:  CM received call from [employer] ; pt can not be approved for trilogy/vent without home assessment.  [facility] will meet at pts home during 1400 hour for assessment - once assessment is complete - [facility] will then move forward for final approval.  Also, [facility] informed CM that due to pts secondary insurance wound vac can not be supplied by [facility] as previously communicated.   CM spoke with pt/pts sister - pt and sister request that [facility] provide wound vac - CM made referral to agency - agency accepted referral pending completed wound vac form.  CM spoke with Plastics - group in agreement for pt to use DME of choice [facility] for wound vac  [employer] has accepted wound vac  referral - wound vac will be delivered directly to pts home tonight   Expected Discharge Plan: Home w Home Health Services Barriers to Discharge: Equipment Delay  Expected Discharge Plan and Services Expected Discharge Plan: Home w Home Health Services Discharge Planning Services: CM Consult Post Acute Care Choice: Durable Medical Equipment, Home Health Living arrangements for the past 2 months: Skilled Nursing Facility, Single Family Home                 DME Arranged: (wound vac will be supplied by [facility]) DME Agency: [facility] HH Arranged: RN HH Agency: [facility]   Social Determinants of Health (SDOH) Interventions    Readmission Risk Interventions 30 Day Unplanned Readmission Risk Score     ED to Hosp-Admission (Current) from 08/18/2018 in [facility]  30 Day Unplanned Readmission Risk Score (%)  24 Filed at 08/25/2018 0801     This score is the patient's risk of an unplanned readmission within 30 days of being discharged (0 -100%). The score is based on dignosis, age, lab data, medications, orders, and past utilization.   Low:  0-14.9   Medium: 15-21.9   High: 22-29.9   Extreme: 30 and above       No flowsheet data found.

## 2018-08-26 ENCOUNTER — Telehealth: Payer: Self-pay | Admitting: Internal Medicine

## 2018-08-26 NOTE — Clinical Note (Incomplete)
CM contacted by [facility] liaison - HHRN went to the home this am however pts sister/pt refuses HH services. [facility] RN is not trained on [employer] wound vac - the option was given to exchange with [employer] wound vac however sister/pt refused.  CM contacted sister.  Sisler informed CM that she has medical personal in her family, son - in- law is a medical doctor and the pts niece reports that she has seen the wound vac replace 3 times.  Carrie informed CM that they do not need a HHRN for the wound vac and if she runs into problems she will contact Dr Ulice Bold promptly.

## 2018-08-26 NOTE — Telephone Encounter (Signed)
Pharmacy requesting 90-day supply.. please approve or deny if appropriate.Rachael Spittle Cassady3/19/20202:48 PM

## 2018-08-26 NOTE — Care Management (Signed)
CM contacted by [facility] liaison - pt/pts sister refusing HH services.  CM contacted pts sister Lyla Son - sister confirmed that she felt family could handle wound vac including initial set up.  Pt refused wet to dry dressing prior to discharge and ultimately discharged home with wound vac dressing without suction ( attending aware prior to discharge)  [facility] to deliver home wound vac at any time this am.  Lyla Son informed CM that if she has any problems or concerns she will promptly contact Dr Ulice Bold.

## 2018-08-26 NOTE — Telephone Encounter (Signed)
Pharmacy requesting 90-day supply.  Please approve or deny if appropriate.Rachael Spittle Cassady3/19/20202:47 PM

## 2018-08-30 ENCOUNTER — Telehealth: Payer: Self-pay | Admitting: Plastic Surgery

## 2018-08-30 ENCOUNTER — Ambulatory Visit: Payer: 59

## 2018-08-30 NOTE — Telephone Encounter (Signed)
Faxed orders for Home Health for wound care to [facility]-Attn:Lori.  Confirmation received.  Received call from [facility] with [facility], and she stated that she had received the order and they will not be able to see the patient because she lives by herself and it's unsafe for her and it would be a liability to them.  And she's not sure if her living condition has changed.  Lawson Fiscal mentioned that maybe we could try another Home Health.//AB/CMA

## 2018-08-30 NOTE — Telephone Encounter (Signed)
Called the patient back to see if she had contacted Home Health.  Patient's niece got on the phone.  I informed the niece that I spoke with the patient this morning regarding her wound leaking and for her to contact Home Health and ask them to come out and change her wound vac today instead of tomorrow.  I asked the niece of the name of the Home Health that is coming to change her wound vac.  She kind of laughed and said that Home Health is not coming out because the patient is not set up with Home Health.  Asked the niece why was Home Health not set up and she stated that Home Health ([facility]) refused to come.  Asked her who was supplying the wound vac, and she stated that [facility].  Also asked the niece who was changing the wound vac and she stated that she was told by Dr. Ulice Bold not to change it for 1 week and 1 week will be tomorrow.  Informed the niece that I will fill out the form for [facility] to get her set up for wound care.  She agreed.  She stated that [facility] was doing PT and OT with the patient.//AB/CMA

## 2018-08-30 NOTE — Telephone Encounter (Signed)
Called and spoke with the patient regarding the message below.  Patient stated that her bandage was leaking.  Asked the patient how much leakage and what color was it.  She stated it was dripping and the color was a little red.  Asked her when did she notice the dripping and she stated this morning.  Patient stated that there was a pool of blood on the floor from the bandage.  Spoke with [employer] and she stated that see if we could get Home Health out today and have them change the wound vac today.  Informed the patient of that I spoke with [facility] and what she recommended above.  Patient verbalized understanding and agreed.  Patient is going to reach out to Home House.//AB/CMA

## 2018-08-30 NOTE — Telephone Encounter (Signed)
Patient's niece called and stated that the wound vac is draining a bright red substance. States that the wound vac is not supposed to be changed until tomorrow, but would like to know if she should be seen today. Please call patient and advise.

## 2018-08-30 NOTE — Progress Notes (Deleted)
   CC: ***  HPI:  [name] is a 68 y.o. 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   The patient was hospitalized from 3/11-3/18 for acute on chronic systolic heart failure that was attributed to her diuretics being stopped during a prior hospitalization and due to increased salt intake. Patient was documented to be a dry weight of 203.7kg (447lbs) on discharge with stable kidney function. The patient's dry weight is 444lbs. She refused discharge to SNF. She was started on spironolactone 100mg  qd, bumex 4mg  qd on discharge.   -BMP  RLE hematoma  The patient is to follow with plastic surgery   Past Medical History:  Diagnosis Date  . Arthritis   . Asthma   . CHF (congestive heart failure) (HCC)   . COPD (chronic obstructive pulmonary disease) (HCC)   . Obesity   . Opiate use   . Renal disorder   . Tracheostomy in place [facility])    Review of Systems:  ***  Physical Exam:  There were no vitals filed for this visit. ***  Assessment & Plan:   See Encounters Tab for problem based charting.  Patient {GC/GE:[id number]::"discussed with","seen with"} Dr. {NAMES:[id number]::"Butcher","Granfortuna","E. Hoffman","Klima","Mullen","Narendra","Raines","Vincent"}

## 2018-08-31 ENCOUNTER — Telehealth: Payer: Self-pay | Admitting: *Deleted

## 2018-08-31 NOTE — Telephone Encounter (Signed)
Called and LMOM on (08/30/18) for Rachael Robertson with [facility] to give me a call back regarding the call from the patient's niece.  Rachael Robertson called back and I informed her of the call from the patient's niece regarding the order for the wound.  Rachael Robertson stated that they did receive the order.  She went on to tell me that the Case worker from the hospital first sent wound care orders to [facility], but the patient's niece refused the home health referral and said she would be taking care of the patient's wound care.  She said the note is in Epic.  Informed Rachael Robertson that I had reached out to other Home Health offices and I'm waiting for call backs.  She stated that with the patient's insurance we would need to contact a office that has Copy.  Also informed Rachael Robertson of what the patient's niece said about the change in the patient's living condition, and that [facility] was not aware of the new living condition.//AB/CMA

## 2018-08-31 NOTE — Telephone Encounter (Signed)
Received another call back from the patient's niece on (08/30/18) to say the patient's wound has gotten more redder during the day, and she wanted to know if the patient needs to come in. She also wanted to know if the order for wound care was faxed to [facility].  She said she spoke with someone at [facility] and was told that they had not received anything.  Informed the patient that the orders have been faxed and a confirmation has been received.  Asked the niece who informed her that [facility] refused to see the patient.  She stated that the Case worker in the hospital sent the first order to [facility] and the Case worker was the one who informed her that [facility] refused to see the patient.  Asked the niece if she was staying with the patient, and she said that they have a care team that comes during the day and she comes in between the team.  But the patient stays along at night.  Informed the niece that I would give [facility] a call back.//AB/CMA

## 2018-08-31 NOTE — Telephone Encounter (Signed)
Called and spoke with the patient this morning and informed her that I have contacted other Home Health agencies on yesterday.  I let her know that I had contacted a agency but the nurse would not be able to come out until next week.  Informed the patient that her insurance is another issue regarding getting another Home Health to come out.  Asked the patient about her insurance and she stated that Medicare is her primary and the [facility]) is secondary.  Informed the patient that I will be reaching out to another agency.  Patient verbalized understanding and agreed.//AB/CMA

## 2018-08-31 NOTE — Telephone Encounter (Signed)
Received a VM from the patient's niece on (08/30/18) informing us that [facility] will not see the patient because of the patient's living conditions.//AB/CMA

## 2018-08-31 NOTE — Telephone Encounter (Signed)
Called [facility] this morning and LMOM asking Tiffany to give me a call back regarding the patient.  Tiffany called back and I informed her that the patient's Medicare is her primary insurance.  She asked me to fill out the referral form with wound care orders along with the last OV note and fax it to her and she will clarify the insurance and try to get a nurse out tomorrow or the next day.  Home Health referral form completed with orders for Home Health nursing for vac change twice a week and signed, and faxed to [facility].  Confirmation received.  Informed Dr. Ulice Bold that referral for Home Health has been done, but they may not be able to get a nurse out to the home until tomorrow or the next day.  Dr. Ulice Bold said ask the niece to take the vac unit off today and keep the sorbact on and use KY and gauze on the wound until the nurse comes out to take care of the wound.   Called and spoke with the patient and her niece and informed them we have sent a order to [facility] to come out and take care of the wound.  Also informed them that the nurse may not be able to come until tomorrow or the next day, so Dr. Ulice Bold would like for the niece to take the vac unit off today and leave on the sorbact and use KY and gauze on the wound until the nurse from Home Health comes to take care of the wound.  The niece asked if the green part on the wound is what she's to leave in and I informed her that it was.  Asked the patient and the niece if they had any questions and they both said that they understood and agreed.//AB/CMA

## 2018-08-31 NOTE — Telephone Encounter (Signed)
Called and spoke with Tiffany with [facility] on (08/30/18) regarding the patient.  She stated that sense the patient's Medicare is secondary they would not be able to see her.  But she did give me names of other Home Health's agency ([facility]).  Called [facility] and spoke with [facility] who took the patient's information, and gave me a call back after speaking with one of the nurses to let me know that they could see the patient but they would not be able to see her until next week.  They are short of nurses right now.  Called [facility] and spoke with a nurse and she informed me that right now they are only taking patients that are in nursing facilities.//AB/CMA

## 2018-09-01 ENCOUNTER — Telehealth: Payer: Self-pay | Admitting: Plastic Surgery

## 2018-09-01 NOTE — Telephone Encounter (Signed)
Received call from Tiffany with [facility] stating that the patient's insurance was accepted and that they would be out to se the patient tomorrow. Tiffany has informed the patient of same and patient was very grateful for the assistance. Will follow up with patient as needed.

## 2018-09-06 NOTE — Telephone Encounter (Signed)
Patient's niece called regarding the patient's wound. She states that she and the nurse were concerned as the patient has a "line of black around the wound" and "the sponge was sticking to the wound as the nurse tried to pull it off." Her niece, Jola Babinski would like a call back to discuss further on [phone].

## 2018-09-07 ENCOUNTER — Telehealth: Payer: Self-pay | Admitting: Plastic Surgery

## 2018-09-07 NOTE — Telephone Encounter (Signed)
Received voicemail from Will Shallow, OT with [facility] after completing [facility]enn's OT screening. Mr. Lyndle Herrlich is requesting orders for [facility] as follows: Requesting OT once a week for the first week Requesting OT twice a week for three weeks Requesting OT once a week for the last week Mr. Shallow can receive verbal orders at  [phone].

## 2018-09-07 NOTE — Telephone Encounter (Signed)
Received voicemail from patient's niece. Patient's niece states that home health visited the patient yesterday, but she is worried about the patient's wound. Niece is requesting a call back to speak with clinical staff.

## 2018-09-07 NOTE — Telephone Encounter (Signed)
Elease Hashimoto, LPN with [facility] called about patient. Same states that she applied a new wound vac, but the black foam was stuck to the wound. She removed the black foam and found bloody drainage. Elease Hashimoto, LPN is requesting orders to apply calcium alginate to the wound and under to assist in skin healing. Best call back number is [phone].

## 2018-09-07 NOTE — Telephone Encounter (Signed)
Called and spoke with Elease Hashimoto, LPN with [facility] regarding the message below.  Informed Elease Hashimoto per Dr. Catha Gosselin leave the green in place then place adaptic with KY gel, then place the vac for 1 more week.  Then they can take care of the wound however they feel the wound will need to be taking care of.  Elease Hashimoto stated that when the first nurse came to take care of the wound she noted that there was no green sorbact on wound.  So the wound had nothing on it.  So the black foam was placed directed on the wound bed and the vac on the top of the foam.  I informed Elease Hashimoto that I spoke with the patient's niece,and I read to her the note from (08/31/18) where I spoke with the niece.  I told her that the patient's niece must have taking everything off the wound, because she asked me about the green sorbact.  Elease Hashimoto agreed that the niece must have taking everything off the wound.  Informed Elease Hashimoto that I would like for her to speak with Octavio Graves, RN so she can inform her of everything that will need to be done for the wound now that everything was taking off the wound.  Elease Hashimoto agreed and spoke with Octavio Graves, Charity fundraiser.  She was informed by Bonita,RN to place adaptic, KY gel and the vac for 1 more week.  Elease Hashimoto verbalized understanding and agreed.  While on the phone with Elease Hashimoto we also informed her that we received a message from [facility] needing OT orders for the patient.  We informed her that we do not place OT orders.//AB/CMA

## 2018-09-10 ENCOUNTER — Telehealth: Payer: Self-pay | Admitting: Plastic Surgery

## 2018-09-10 NOTE — Telephone Encounter (Signed)
Received call from [facility] with [facility] with an update on the patient. Dia Sitter stated that the patient's leg looks the same with no more redness or irritation present, but the patient is complaining that the leg is hurting more. Patient gives pain a 7 out of 10. Dia Sitter can be reached at [phone]

## 2018-09-10 NOTE — Addendum Note (Signed)
Addended by: Bufford Spikes on: 09/10/2018 03:33 PM   Modules accepted: Orders

## 2018-09-10 NOTE — Telephone Encounter (Signed)
Called and spoke with Alvino Chapel with [facility] regarding the message below.  Alvino Chapel stated she call with the message as a FYI.  She said when the patient's complains of pain they have to call with the information.  She stated that the patient had not been taking the Tramdol for pain.  She asked the patient why she was not taking the Tramdol and she stated she wanted to know what was causing the pain.  She said she told the patient it was the wound healing,so she needs to take the Tramdol. She also stated that the patient told her that the wound vac was not working.  She placed the vac on and it is working fine.//AB/CMA

## 2018-09-15 ENCOUNTER — Telehealth: Payer: Self-pay | Admitting: *Deleted

## 2018-09-15 NOTE — Telephone Encounter (Signed)
Received call from Tiffany with [facility] on (09/14/18) regarding wound care for the patient.  She stated that the wound vac for the patient is to be discontinued this Thursday, but the nurse is seeing bloody drainage when she takes the vac off.  So the nurse needs to know if the vac is to be D/C on Thursday, how does Dr. Ulice Bold want her to treat the wound.  Informed Dr. Ulice Bold of the message from Tiffany,and she stated to continue the wound vac for 2 more months.  Spoke back with Tiffany and informed her of the new orders for the wound vac from Dr. Ulice Bold.  Tiffany verbalized understanding and stated I will not need to do anything that she will inform the nurse of the new orders.//AB/CMA

## 2018-09-22 ENCOUNTER — Telehealth: Payer: Self-pay | Admitting: Plastic Surgery

## 2018-09-22 NOTE — Telephone Encounter (Signed)
Patient called to update staff. Patient stated that when home health changed the wound vac yesterday, the home health nurse stated that "the wound smells infected". Patient denied any discomfort, redness, discharge, warmth, or fever. Patient stated that she normally does not have a fevers with her infections. Spoke with Octavio Graves, RN who stated that we need notes from home health for Dr. Ulice Bold to review. Informed patient to have home health contact our office with report or fax notes for review. Informed patient that we would call her for follow up upon receipt of notes.

## 2018-09-22 NOTE — Telephone Encounter (Signed)
   Called Trenace,RN with [facility] regarding the message below.  She stated that she saw the patient for the first time on yesterday.  She said the wound has a drainage, it looks like the area is turning black, and it has a bad odor.  She stated that she feels the wound is infected.  Informed Trenace,RN that I will inform Dr. Ulice Bold of this message, and give her a call back.  She agreed.  Informed Dr. Ulice Bold of the above message.  Per Dr. Dionisio David if they could send a picture of the wound,and she will send in an antibiotic,and she would also like for them to do Dakin's 0.025% wet to dry 2x/daily for 1 week.  Called Trenace,RN back and informed her of the orders from Dr. Lorain Childes verbalized understanding and agreed.  She asked if I would fax the orders to her.  Fax orders and confirmation received.//AB/CMA

## 2018-09-22 NOTE — Telephone Encounter (Signed)
Trenace from [facility] called to give report on patient. No clinical staff available at this time to take report. Informed Trenace that clinical staff will call back at their earliest convenience. Best number to reach her is [phone]

## 2018-09-23 MED ORDER — DOXYCYCLINE HYCLATE 100 MG PO TABS: 100.0000 mg | ORAL_TABLET | Freq: Two times a day (BID) | ORAL | 0 refills | Status: AC

## 2018-09-23 NOTE — Progress Notes (Signed)
09/23/18 Home health nurse called with concerns for increased swelling and a slight increase in redness.  It was recommended that she take her to the ER for further evaluation.  In the meantime I am willing to call in the antibiotic into the pharmacy.

## 2018-09-23 NOTE — Addendum Note (Signed)
Addended by: Peggye Form on: 09/23/2018 12:23 PM   Modules accepted: Orders

## 2018-09-30 ENCOUNTER — Ambulatory Visit (HOSPITAL_COMMUNITY): Payer: 59

## 2018-09-30 ENCOUNTER — Telehealth: Payer: Self-pay | Admitting: Plastic Surgery

## 2018-09-30 NOTE — Telephone Encounter (Signed)
Received voicemail from patient. She is requesting a call back regarding cleaning her wound.

## 2018-10-01 NOTE — Telephone Encounter (Signed)
Spoke with [facility] and informed her that I spoke with Dr. Ulice Bold and she stated that was fine to use the Calcium Alginag w/Silver 3x/week and as prn.  She verbalized understanding and agreed.//AB/CMA

## 2018-10-01 NOTE — Telephone Encounter (Signed)
Called and spoke with the patient on (09/29/18) regarding the message below.  Patient stated that there is a order for wet to dry dressing 2x/day on her leg.  She said the dressing is drying out before the next dressing change.  So she is requesting if the order could be changed to 3x/day.  Asked the patient who was doing the dressing changes.  She said the nurse comes 2x/week, and she will be coming today.  And her niece also does the dressing changes.  She said the nurse is going by the doctor's orders, so she can't do the 3x/day.  Informed the patient that I will inform Dr. Ulice Bold of the message and get back with her as so as I can.  Patient verbalized understanding and agreed.//AB/CMA

## 2018-10-01 NOTE — Telephone Encounter (Signed)
Received a call from the patient on (09/30/18).  She stated that she wanted to know if the doctor would order to use Calcium Alginag  w/Silver on her wound instead of wet to dry.  Asked the patient why she wanted to change to the Calcium Alginag w/Silver.  And she stated that the nurse thinks it would be good for her leg.  Ask her who the nurse was and she stated that the nurse was with her now, so I asked to speak with the nurse.  Holly,LPN with [facility] got on the phone and she stated that she feels the Calcium Alginag w/Silver will help with the legs tenderness and pain, and the bloody yellow drainage.  She said the silver will help keep down infect.  Use 3x/week and as prn.  I informed Holly,LPN that Dr. Ulice Bold is not in the office today and it would be tomorrow before I can speak with her regarding the request for a order change.  I asked her if that would be okay.  Holly,LPN stated that the next nurse visit will be on Monday.//AB/CMA

## 2018-10-05 ENCOUNTER — Telehealth: Payer: Self-pay | Admitting: Plastic Surgery

## 2018-10-05 NOTE — Telephone Encounter (Signed)
Called and spoke with Will-Occupational Therapist with [facility] regarding the message below.  Informed Will that per Dr. Ulice Bold he will need to contact the patient's PCP for verbal orders for additional visit for the items below.  Asked Will who wrote the first order and he stated that as he looks in the patient file it looks like it came from Dr. Ulice Bold.  He stated that Dr. Ulice Bold is the only provider in there.  Informed him that Dr. Ulice Bold is only addressing the  wound care, and he will need to contact the patient's PCP.  Will stated that he will see what he can find.//AB/CMA

## 2018-10-05 NOTE — Telephone Encounter (Signed)
Will, a Acupuncturist for [facility] is calling to request verbal auth for additional visits to work on transfers and other ADLs  1 time a week for the first week 2 times a week for three weeks   He can be reached at [phone]

## 2018-10-20 ENCOUNTER — Telehealth: Payer: Self-pay | Admitting: Plastic Surgery

## 2018-10-20 ENCOUNTER — Telehealth: Payer: Self-pay | Admitting: Pulmonary Disease

## 2018-10-20 ENCOUNTER — Other Ambulatory Visit: Payer: Self-pay | Admitting: Internal Medicine

## 2018-10-20 NOTE — Telephone Encounter (Signed)
Informed Dr. Ulice Bold of the message below.//AB/CMA

## 2018-10-20 NOTE — Telephone Encounter (Signed)
Needs refill on traMADol (ULTRAM) 50 MG tablet [id number] - [place], [place] - [address] AT [facility] OF [address]  ;pt contact [phone]   Pt caregiver is requesting that she gets more pain medicine; pls contact [phone]

## 2018-10-20 NOTE — Telephone Encounter (Signed)
Received call from Jillyn Hidden, physical therapist with [facility], to give notification to Dr. Michae Kava had many concerns regarding the patient. Currently, the patient is stating that she is in a lot of pain and rating it a 9 out of 10 without movement. Patient has not begun her physical therapy session for the day. Patient is also beginning to form blisters on her skin; these are new and he has not seen them at previous visits. Stated that these were not pressure ulcers. Blisters are on left hip as well as perineal area and groin. Expressed concern due to patient's weight that the patient is not receiving proper hygiene. Jillyn Hidden stated that he is getting a home health nurse out to assess the patient due to his multiple concerns.

## 2018-10-20 NOTE — Telephone Encounter (Signed)
Left message for patient to call back  

## 2018-10-20 NOTE — Telephone Encounter (Signed)
What does she want the tramadol for?

## 2018-10-20 NOTE — Telephone Encounter (Signed)
This patient needs to call her PCP. She has a PCP>> Chundi, V[place]. She is at the [facility] at [facility]. She needs to call them for an appointment. Please look at all the notes in Epic. She has been seeing them on a regular basis. Thanks

## 2018-10-20 NOTE — Telephone Encounter (Signed)
Primary Pulmonologist: AO Last office visit and with whom: 07/12/2018 with AO What do we see them for (pulmonary problems): chronic resp fail with hypoxia Last OV assessment/plan: Instructions   Continue using Trilogy Reinitiate Xarelto Encourage physical activity  I will see you back in the office in about 3 months      Was appointment offered to patient (explain)?  Pt and care giver Vernona Rieger want recommendations   Reason for call: Called pt and spoke with her care giver Janine Ores. Per Vernona Rieger, pt has had a hematoma on the right side of her abdomen which she has had for about 2 years and now it has developed blistering. Pt also has swelling of her right ankle and leg which began 4 days ago. Vernona Rieger stated that pt is morbidly obese but due to the new issues that have come up with the changes in the hematoma and also ankle and leg swelling, that is why she called our office for recommendations.  Pt is currently still taking spironolactone 50mg  tablet which she takes daily. Per Vernona Rieger, pt's current weight was 404 but Vernona Rieger stated that pt might have gained about 15 pounds of fluid which Vernona Rieger stated the fluid has increased over the past couple days.  Vernona Rieger also stated that pt is needing a refill of tramadol, bumex, and oxycodone and I stated to her that in regards to refill of those meds and also due to the hematoma, she needed to contact her PCP but Vernona Rieger stated that pt did not have a PCP and that is why she called our office for recommendations. Vernona Rieger stated that pt moved to [facility] from [facility] November 2019.  Sarah, please advise what you think pt needs to do? Thanks!

## 2018-10-21 ENCOUNTER — Other Ambulatory Visit: Payer: Self-pay

## 2018-10-21 ENCOUNTER — Telehealth: Payer: Self-pay

## 2018-10-21 ENCOUNTER — Telehealth: Payer: Self-pay | Admitting: Pulmonary Disease

## 2018-10-21 ENCOUNTER — Ambulatory Visit (INDEPENDENT_AMBULATORY_CARE_PROVIDER_SITE_OTHER): Payer: Medicare Other | Admitting: Internal Medicine

## 2018-10-21 DIAGNOSIS — I5023 Acute on chronic systolic (congestive) heart failure: Secondary | ICD-10-CM | POA: Diagnosis not present

## 2018-10-21 DIAGNOSIS — R1903 Right lower quadrant abdominal swelling, mass and lump: Secondary | ICD-10-CM

## 2018-10-21 DIAGNOSIS — T148XXA Other injury of unspecified body region, initial encounter: Secondary | ICD-10-CM | POA: Diagnosis not present

## 2018-10-21 MED ORDER — ALBUTEROL SULFATE HFA 108 (90 BASE) MCG/ACT IN AERS: 1.0000 | INHALATION_SPRAY | Freq: Four times a day (QID) | RESPIRATORY_TRACT | 2 refills | Status: AC | PRN

## 2018-10-21 MED ORDER — TRAMADOL HCL 50 MG PO TABS: 50.0000 mg | ORAL_TABLET | Freq: Four times a day (QID) | ORAL | 0 refills | Status: AC | PRN

## 2018-10-21 NOTE — Progress Notes (Signed)
  [facility] Internal Medicine Residency Telephone Encounter Continuity Care Appointment  HPI:   This telephone encounter was created for [name] on 10/21/2018 for the following purpose/cc right abdominal pain.   Past Medical History:  Past Medical History:  Diagnosis Date  . Arthritis   . Asthma   . CHF (congestive heart failure) (HCC)   . COPD (chronic obstructive pulmonary disease) (HCC)   . Obesity   . Opiate use   . Renal disorder   . Tracheostomy in place [facility])       ROS:   Reports right lower quadrant abdominal pain, and right knee pain.  Denies fevers, chills, nausea, vomiting, headaches, lightheadedness, dizziness, shortness of breath, cough, wheezing, or other symptoms.   Assessment / Plan / Recommendations:   Please see A&P under problem oriented charting for assessment of the patient's acute and chronic medical conditions.   As always, pt is advised that if symptoms worsen or new symptoms arise, they should go to an urgent care facility or to to ER for further evaluation.   Consent and Medical Decision Making:   Patient discussed with Dr. Cyndie Chime  This is a telephone encounter between [facility] and Rachael Robertson on 10/21/2018 for right abdominal pain. The visit was conducted with the patient located at home and Rachael Robertson at [facility]. The patient's identity was confirmed using their DOB and current address. The his/her legal guardian has consented to being evaluated through a telephone encounter and understands the associated risks (an examination cannot be done and the patient may need to come in for an appointment) / benefits (allows the patient to remain at home, decreasing exposure to coronavirus). I personally spent 20 minutes on medical discussion.

## 2018-10-21 NOTE — Assessment & Plan Note (Signed)
Spoke with patient, she reports that she has a hematoma on her right lower abdomen, for the past 2 years, she reports that 2 days ago it started blistering and secreting plasma, blood and pus. She has been having a lot of pain in her right abdomen where her hematoma is. She reports that she is trying to do pain management, she is taking tylenol, tramadol 50 mg ( for medium pain), and oxycodone 5 mg and morphine 15 mg. She reports that she has not been taking the morphine, and that this script is old so she hasn't used this.   Spoke with Janine Ores, caregiver who was hired 2 weeks ago. She reports that she has had a blister on her leg and it is healing well, she has not had any pain in that area but has some pain to touch. Her niece is a homeopathic person and that she had taken some of medications away, including Bumex, potassium. She kept giving the mirapex, spironolactone, and the acetaminophen, and synthroid, and sometimes the Prilosec. Called EMS for albuterol treatment, which will be called in. She has restarted taking her Bumex and potassium. She reports that there is now more blistering, and that it started getting smaller. She has been having a lot of pain, and that she has been doing tonics to release toxins. EMS was contacted yesterday and they reported her lungs were clear. Denies fevers, chills, nausea, vomiting, shortness of breath, chest pain, or other symptoms.  [facility] nurse Mo, helps with wound care, helps with leg and with abdomen. She was dressing her leg wound, the blisters in her abdomen showed some purulent and serosangionus with large amount of drainage, superficial. Some bruising. Going to put petroleum gauze. More from fluid overload.  Assessment and plan: -Patient has had this abdominal mass for a few years now, she had reported that it was around that area Lovenox and she actually had one on her left side that had resolved.  A CT abdomen pelvis on 2/21 showed a partially  visualized large right lateral abdominal wall subcutaneous complex fluid collection that measured 18 cm in diameter with smaller hyperdense fluid collections or medially measuring 4.6 cm in diameter.  The area is now draining some fluid.  It is unclear what exactly this mass is, since she appears to be hemodynamically stable and is showing no systemic signs I do not think this is an emergent issue.  We will evaluate in clinic in a few weeks and have repeat imaging done at that time to assess the size.  -Continue wound care, appreciate [facility] nursing assistance -Monitor for fever, chills and other systemic signs infection -Advised patient to take Tylenol and tramadol as needed, avoid taking oxycodone or morphine, advised her to discard these medications.

## 2018-10-21 NOTE — Telephone Encounter (Signed)
Returned call to pts caregiver [place]. Asked who Mo was and made aware they requested refill of albuterol inhaler. She says it is therapist working with patient. Notified not on DPR therefore not allowed to make request. Rx was needed and had been sent per Carrie's request. She was very overwhelmed as pt is not doing well. There was a call placed on yesterday Pt is in a lot of pain and they were advised to contact pcp. They have a 10:30 televisit today as she realized it was not 10:33. Call ended. Nothing further needed.

## 2018-10-21 NOTE — Assessment & Plan Note (Addendum)
Patient is supposed to be on spironolactone, Bumex, and potassium.  Per the nurse patient has not been taking her Bumex or potassium, she reports that the sister has been giving this.  She was having some worsening shortness of breath and few days ago EMS was contacted, the caregiver reported that her lungs sounded normal and she was given albuterol prescription.  Since that time patient has been getting her Spironolactone 50 mg daily and Bumex 2 mg daily, she is also resumed her potassium pills.  Today patient denies any shortness of breath, chest pain, cough, wheezing, or other symptoms.  Advised caregiver to continue her home medications and continue to monitor weights.  Plan: -Continue spironolactone 50 mg daily -Continue Bumex 2 mg daily -Continue potassium 40 mg daily -Reevaluate in a few weeks when patient returns to clinic for abdominal mass

## 2018-10-21 NOTE — Telephone Encounter (Signed)
Please schedule her to have acc televisit. Thank you    Ameri Cahoon

## 2018-10-21 NOTE — Telephone Encounter (Signed)
MO with [facility] requesting to speak with a nurse about pt meds. Please call back.

## 2018-10-21 NOTE — Telephone Encounter (Signed)
Return call to Mo RN, [facility] - needing to clarify which diuretics pt is on. She stated pt's family is not given her any diuretics; her legs are swollen and pt c/o BLS's pain. informed Mo pt is on Spironolactone 50 mg daily and Bumex 2 mg daily. And Mo suggested scheduling a tele visit to discuss pain med; she believes this was prescribed by pt's doctor in [place].

## 2018-10-21 NOTE — Telephone Encounter (Signed)
Left message for patient to call back  

## 2018-10-21 NOTE — Telephone Encounter (Signed)
Called and talked to pt's sister - stated having "medium" pain of lower legs which she has blisters. Also sister stated needs refill on Albuterol; they had to call EMS yesterday just for a breathing tx. Offered tele health visit as suggested by Mo RN form [facility]- agreeable; sister stated pt's primary concern is her breathing. [facility] tele health visit scheduled for today @ 1030 AM.

## 2018-10-22 ENCOUNTER — Telehealth: Payer: Self-pay | Admitting: Pulmonary Disease

## 2018-10-22 MED ORDER — IPRATROPIUM-ALBUTEROL 0.5-2.5 (3) MG/3ML IN SOLN: 3.0000 mL | Freq: Four times a day (QID) | RESPIRATORY_TRACT | 4 refills | Status: AC | PRN

## 2018-10-22 NOTE — Progress Notes (Signed)
Medicine attending: Medical history, presenting problems, physical findings, and medications, reviewed with resident physician Dr Hermine Messick on the day of the patient telephone consultation and I concur with her evaluation and management plan. 68 y/o w OSA/permanent trach/O2 dependent. Hx PE. Anticoagulants had to be stopped when she developed first, low grade hemoptysis, then extensive hematomas of R abdominal wall & RLE; she required surgical evacuation of R leg hematoma & still has a wound vac in place. She C/O peristent R abdominal discomfort; blistering, serosanguinous drainage from abd wall wound reported by her home RN today. No fever. Continuing supervised local wound care. She would benefit from a in person visit and follow up US or CT of abdomen. Initial complex fluid collection over an 18 cm area. Hx CHF; symptoms improved back on appropriate diuretics.

## 2018-10-22 NOTE — Telephone Encounter (Signed)
Spoke with patient. She stated that she needed a refill on her DuoNebs to sent to [facility] on [address]. Advised her that I would go ahead and send this in for her. She verbalized understanding. Nothing further needed at time of call.

## 2018-10-22 NOTE — Telephone Encounter (Signed)
Looks like Albuterol has been refilled by pulmonology

## 2018-10-22 NOTE — Telephone Encounter (Signed)
ATC patient, call was disconnected. Will call back later.

## 2018-10-22 NOTE — Telephone Encounter (Signed)
See previous message

## 2018-10-23 ENCOUNTER — Emergency Department (HOSPITAL_COMMUNITY): Payer: Medicare Other

## 2018-10-23 ENCOUNTER — Other Ambulatory Visit: Payer: Self-pay

## 2018-10-23 ENCOUNTER — Inpatient Hospital Stay (HOSPITAL_COMMUNITY)
Admission: EM | Admit: 2018-10-23 | Discharge: 2018-11-08 | DRG: 871 | Disposition: E | Payer: Medicare Other | Attending: Pulmonary Disease | Admitting: Pulmonary Disease

## 2018-10-23 DIAGNOSIS — M1990 Unspecified osteoarthritis, unspecified site: Secondary | ICD-10-CM | POA: Diagnosis present

## 2018-10-23 DIAGNOSIS — L089 Local infection of the skin and subcutaneous tissue, unspecified: Secondary | ICD-10-CM

## 2018-10-23 DIAGNOSIS — N179 Acute kidney failure, unspecified: Secondary | ICD-10-CM | POA: Diagnosis not present

## 2018-10-23 DIAGNOSIS — E039 Hypothyroidism, unspecified: Secondary | ICD-10-CM | POA: Diagnosis present

## 2018-10-23 DIAGNOSIS — Z87892 Personal history of anaphylaxis: Secondary | ICD-10-CM

## 2018-10-23 DIAGNOSIS — N170 Acute kidney failure with tubular necrosis: Secondary | ICD-10-CM | POA: Diagnosis present

## 2018-10-23 DIAGNOSIS — Z8249 Family history of ischemic heart disease and other diseases of the circulatory system: Secondary | ICD-10-CM

## 2018-10-23 DIAGNOSIS — Z6841 Body Mass Index (BMI) 40.0 and over, adult: Secondary | ICD-10-CM

## 2018-10-23 DIAGNOSIS — Z808 Family history of malignant neoplasm of other organs or systems: Secondary | ICD-10-CM

## 2018-10-23 DIAGNOSIS — E162 Hypoglycemia, unspecified: Secondary | ICD-10-CM | POA: Diagnosis present

## 2018-10-23 DIAGNOSIS — Z515 Encounter for palliative care: Secondary | ICD-10-CM | POA: Diagnosis not present

## 2018-10-23 DIAGNOSIS — R001 Bradycardia, unspecified: Secondary | ICD-10-CM

## 2018-10-23 DIAGNOSIS — E875 Hyperkalemia: Secondary | ICD-10-CM

## 2018-10-23 DIAGNOSIS — Z79899 Other long term (current) drug therapy: Secondary | ICD-10-CM

## 2018-10-23 DIAGNOSIS — E872 Acidosis: Secondary | ICD-10-CM | POA: Diagnosis present

## 2018-10-23 DIAGNOSIS — A4102 Sepsis due to Methicillin resistant Staphylococcus aureus: Secondary | ICD-10-CM | POA: Diagnosis not present

## 2018-10-23 DIAGNOSIS — Z888 Allergy status to other drugs, medicaments and biological substances status: Secondary | ICD-10-CM

## 2018-10-23 DIAGNOSIS — I442 Atrioventricular block, complete: Secondary | ICD-10-CM | POA: Diagnosis present

## 2018-10-23 DIAGNOSIS — Z91018 Allergy to other foods: Secondary | ICD-10-CM

## 2018-10-23 DIAGNOSIS — Z930 Tracheostomy status: Secondary | ICD-10-CM

## 2018-10-23 DIAGNOSIS — Z452 Encounter for adjustment and management of vascular access device: Secondary | ICD-10-CM

## 2018-10-23 DIAGNOSIS — Z882 Allergy status to sulfonamides status: Secondary | ICD-10-CM

## 2018-10-23 DIAGNOSIS — L8990 Pressure ulcer of unspecified site, unspecified stage: Secondary | ICD-10-CM

## 2018-10-23 DIAGNOSIS — R570 Cardiogenic shock: Secondary | ICD-10-CM | POA: Diagnosis present

## 2018-10-23 DIAGNOSIS — M726 Necrotizing fasciitis: Secondary | ICD-10-CM | POA: Diagnosis present

## 2018-10-23 DIAGNOSIS — J449 Chronic obstructive pulmonary disease, unspecified: Secondary | ICD-10-CM | POA: Diagnosis present

## 2018-10-23 DIAGNOSIS — J9600 Acute respiratory failure, unspecified whether with hypoxia or hypercapnia: Secondary | ICD-10-CM

## 2018-10-23 DIAGNOSIS — L03311 Cellulitis of abdominal wall: Secondary | ICD-10-CM

## 2018-10-23 DIAGNOSIS — I4891 Unspecified atrial fibrillation: Secondary | ICD-10-CM | POA: Diagnosis present

## 2018-10-23 DIAGNOSIS — S301XXA Contusion of abdominal wall, initial encounter: Secondary | ICD-10-CM | POA: Diagnosis present

## 2018-10-23 DIAGNOSIS — Z79890 Hormone replacement therapy (postmenopausal): Secondary | ICD-10-CM

## 2018-10-23 DIAGNOSIS — R6521 Severe sepsis with septic shock: Secondary | ICD-10-CM | POA: Diagnosis present

## 2018-10-23 DIAGNOSIS — E861 Hypovolemia: Secondary | ICD-10-CM | POA: Diagnosis present

## 2018-10-23 DIAGNOSIS — A419 Sepsis, unspecified organism: Secondary | ICD-10-CM

## 2018-10-23 DIAGNOSIS — Z0189 Encounter for other specified special examinations: Secondary | ICD-10-CM

## 2018-10-23 DIAGNOSIS — I5042 Chronic combined systolic (congestive) and diastolic (congestive) heart failure: Secondary | ICD-10-CM | POA: Diagnosis present

## 2018-10-23 DIAGNOSIS — Z86711 Personal history of pulmonary embolism: Secondary | ICD-10-CM

## 2018-10-23 DIAGNOSIS — Z66 Do not resuscitate: Secondary | ICD-10-CM | POA: Diagnosis not present

## 2018-10-23 DIAGNOSIS — E871 Hypo-osmolality and hyponatremia: Secondary | ICD-10-CM | POA: Diagnosis not present

## 2018-10-23 DIAGNOSIS — Z91013 Allergy to seafood: Secondary | ICD-10-CM

## 2018-10-23 DIAGNOSIS — Z1159 Encounter for screening for other viral diseases: Secondary | ICD-10-CM

## 2018-10-23 DIAGNOSIS — G4733 Obstructive sleep apnea (adult) (pediatric): Secondary | ICD-10-CM | POA: Diagnosis present

## 2018-10-23 DIAGNOSIS — J9611 Chronic respiratory failure with hypoxia: Secondary | ICD-10-CM | POA: Diagnosis present

## 2018-10-23 DIAGNOSIS — Z91040 Latex allergy status: Secondary | ICD-10-CM

## 2018-10-23 DIAGNOSIS — Z803 Family history of malignant neoplasm of breast: Secondary | ICD-10-CM

## 2018-10-23 DIAGNOSIS — M1A9XX0 Chronic gout, unspecified, without tophus (tophi): Secondary | ICD-10-CM | POA: Diagnosis present

## 2018-10-23 DIAGNOSIS — Z881 Allergy status to other antibiotic agents status: Secondary | ICD-10-CM

## 2018-10-23 DIAGNOSIS — Z91048 Other nonmedicinal substance allergy status: Secondary | ICD-10-CM

## 2018-10-23 DIAGNOSIS — Z9981 Dependence on supplemental oxygen: Secondary | ICD-10-CM

## 2018-10-23 LAB — CBC WITH DIFFERENTIAL/PLATELET
Abs Immature Granulocytes: 2.45 10*3/uL — ABNORMAL HIGH (ref 0.00–0.07)
Basophils Absolute: 0.1 10*3/uL (ref 0.0–0.1)
Basophils Relative: 1 %
Eosinophils Absolute: 0 10*3/uL (ref 0.0–0.5)
Eosinophils Relative: 0 %
HCT: 34 % — ABNORMAL LOW (ref 36.0–46.0)
Hemoglobin: 10.1 g/dL — ABNORMAL LOW (ref 12.0–15.0)
Immature Granulocytes: 13 %
Lymphocytes Relative: 6 %
Lymphs Abs: 1.1 10*3/uL (ref 0.7–4.0)
MCH: 25.5 pg — ABNORMAL LOW (ref 26.0–34.0)
MCHC: 29.7 g/dL — ABNORMAL LOW (ref 30.0–36.0)
MCV: 85.9 fL (ref 80.0–100.0)
Monocytes Absolute: 0.3 10*3/uL (ref 0.1–1.0)
Monocytes Relative: 2 %
Neutro Abs: 15.3 10*3/uL — ABNORMAL HIGH (ref 1.7–7.7)
Neutrophils Relative %: 78 %
Platelets: 337 10*3/uL (ref 150–400)
RBC: 3.96 MIL/uL (ref 3.87–5.11)
RDW: 18.5 % — ABNORMAL HIGH (ref 11.5–15.5)
WBC: 19.4 10*3/uL — ABNORMAL HIGH (ref 4.0–10.5)
nRBC: 1.2 % — ABNORMAL HIGH (ref 0.0–0.2)

## 2018-10-23 LAB — I-STAT CHEM 8, ED
BUN: 124 mg/dL — ABNORMAL HIGH (ref 8–23)
BUN: 126 mg/dL — ABNORMAL HIGH (ref 8–23)
Calcium, Ion: 1.06 mmol/L — ABNORMAL LOW (ref 1.15–1.40)
Calcium, Ion: 1.1 mmol/L — ABNORMAL LOW (ref 1.15–1.40)
Chloride: 95 mmol/L — ABNORMAL LOW (ref 98–111)
Chloride: 96 mmol/L — ABNORMAL LOW (ref 98–111)
Creatinine, Ser: 4.6 mg/dL — ABNORMAL HIGH (ref 0.44–1.00)
Creatinine, Ser: 4.6 mg/dL — ABNORMAL HIGH (ref 0.44–1.00)
Glucose, Bld: 131 mg/dL — ABNORMAL HIGH (ref 70–99)
Glucose, Bld: 74 mg/dL (ref 70–99)
HCT: 34 % — ABNORMAL LOW (ref 36.0–46.0)
HCT: 35 % — ABNORMAL LOW (ref 36.0–46.0)
Hemoglobin: 11.6 g/dL — ABNORMAL LOW (ref 12.0–15.0)
Hemoglobin: 11.9 g/dL — ABNORMAL LOW (ref 12.0–15.0)
Potassium: 7.2 mmol/L (ref 3.5–5.1)
Potassium: 7.5 mmol/L (ref 3.5–5.1)
Sodium: 123 mmol/L — ABNORMAL LOW (ref 135–145)
Sodium: 123 mmol/L — ABNORMAL LOW (ref 135–145)
TCO2: 23 mmol/L (ref 22–32)
TCO2: 25 mmol/L (ref 22–32)

## 2018-10-23 LAB — LACTIC ACID, PLASMA: Lactic Acid, Venous: 1.7 mmol/L (ref 0.5–1.9)

## 2018-10-23 LAB — CBG MONITORING, ED
Glucose-Capillary: 152 mg/dL — ABNORMAL HIGH (ref 70–99)
Glucose-Capillary: 79 mg/dL (ref 70–99)

## 2018-10-23 MED ORDER — FENTANYL CITRATE (PF) 100 MCG/2ML IJ SOLN
INTRAMUSCULAR | Status: AC
  Filled 2018-10-23: qty 2

## 2018-10-23 MED ORDER — PRISMASOL BGK 0/2.5 32-2.5 MEQ/L IV SOLN
INTRAVENOUS | Status: DC
  Administered 2018-10-24 (×2): via INTRAVENOUS_CENTRAL
  Filled 2018-10-23 (×8): qty 5000

## 2018-10-23 MED ORDER — HEPARIN SODIUM (PORCINE) 1000 UNIT/ML IJ SOLN
1000.0000 [IU] | INTRAMUSCULAR | Status: AC
  Filled 2018-10-23: qty 1

## 2018-10-23 MED ORDER — PRISMASOL BGK 0/2.5 32-2.5 MEQ/L IV SOLN
INTRAVENOUS | Status: DC
  Administered 2018-10-24 – 2018-10-25 (×6): via INTRAVENOUS_CENTRAL
  Filled 2018-10-23 (×21): qty 5000

## 2018-10-23 MED ORDER — METRONIDAZOLE IN NACL 5-0.79 MG/ML-% IV SOLN: 500.0000 mg | Freq: Once | INTRAVENOUS | Status: DC

## 2018-10-23 MED ORDER — DOPAMINE-DEXTROSE 3.2-5 MG/ML-% IV SOLN
0.0000 ug/kg/min | INTRAVENOUS | Status: DC
  Administered 2018-10-23: 5 ug/kg/min via INTRAVENOUS

## 2018-10-23 MED ORDER — SODIUM ZIRCONIUM CYCLOSILICATE 10 G PO PACK
10.0000 g | PACK | Freq: Once | ORAL | Status: DC
  Filled 2018-10-23: qty 1

## 2018-10-23 MED ORDER — VANCOMYCIN HCL 10 G IV SOLR
2500.0000 mg | Freq: Once | INTRAVENOUS | Status: AC
  Administered 2018-10-24: 2500 mg via INTRAVENOUS
  Filled 2018-10-23: qty 2500

## 2018-10-23 MED ORDER — FENTANYL CITRATE (PF) 100 MCG/2ML IJ SOLN
INTRAMUSCULAR | Status: AC
  Administered 2018-10-23: 50 ug
  Filled 2018-10-23: qty 2

## 2018-10-23 MED ORDER — DEXTROSE 50 % IV SOLN
50.0000 mL | Freq: Once | INTRAVENOUS | Status: AC
  Administered 2018-10-23: 50 mL via INTRAVENOUS

## 2018-10-23 MED ORDER — INSULIN ASPART 100 UNIT/ML IV SOLN
10.0000 [IU] | Freq: Once | INTRAVENOUS | Status: AC
  Administered 2018-10-23: 10 [IU] via INTRAVENOUS

## 2018-10-23 MED ORDER — SODIUM BICARBONATE 8.4 % IV SOLN
50.0000 meq | Freq: Once | INTRAVENOUS | Status: DC
  Filled 2018-10-23: qty 50

## 2018-10-23 MED ORDER — SODIUM CHLORIDE 0.9 % IV SOLN: 2.0000 g | INTRAVENOUS | Status: DC

## 2018-10-23 MED ORDER — VANCOMYCIN HCL IN DEXTROSE 1-5 GM/200ML-% IV SOLN: 1000.0000 mg | Freq: Once | INTRAVENOUS | Status: DC

## 2018-10-23 MED ORDER — SODIUM CHLORIDE 0.9 % IV SOLN
2.0000 g | Freq: Once | INTRAVENOUS | Status: DC
  Filled 2018-10-23: qty 2

## 2018-10-23 MED ORDER — HEPARIN SODIUM (PORCINE) 1000 UNIT/ML DIALYSIS
1000.0000 [IU] | INTRAMUSCULAR | Status: DC | PRN
  Administered 2018-10-24: 2800 [IU] via INTRAVENOUS_CENTRAL
  Administered 2018-10-24: 2400 [IU] via INTRAVENOUS_CENTRAL
  Administered 2018-10-24: 2800 [IU] via INTRAVENOUS_CENTRAL
  Filled 2018-10-23: qty 3
  Filled 2018-10-23 (×3): qty 6
  Filled 2018-10-23: qty 3
  Filled 2018-10-23 (×2): qty 6

## 2018-10-23 MED ORDER — SODIUM CHLORIDE 0.9 % IV BOLUS (SEPSIS)
3000.0000 mL | Freq: Once | INTRAVENOUS | Status: AC
  Administered 2018-10-23: 3000 mL via INTRAVENOUS

## 2018-10-23 MED ORDER — PRISMASOL BGK 0/2.5 32-2.5 MEQ/L IV SOLN
INTRAVENOUS | Status: DC
  Administered 2018-10-24 (×2): via INTRAVENOUS_CENTRAL
  Filled 2018-10-23 (×6): qty 5000

## 2018-10-23 MED ORDER — SODIUM CHLORIDE 0.9 % IV SOLN
2.0000 g | Freq: Two times a day (BID) | INTRAVENOUS | Status: DC
  Filled 2018-10-23 (×2): qty 2

## 2018-10-23 NOTE — Progress Notes (Signed)
Pharmacy Antibiotic Note  Rachael Robertson is a 68 y.o. female admitted on 11/01/2018 with wound infection.  Pharmacy has been consulted for vancomycin and cefepime dosing. Afebrile, WBC not available yet. AKI noted, SCr 4.6 (baseline ~1-1.2).   Plan: Vancomycin 2500 mg IV load - further dosing based on renal function Cefepime 2 g IV q24h Monitor renal function, clinical progress, C/S Vancomycin levels as clinically indicated  Weight: (!) 404 lb (183.3 kg)  Temp (24hrs), Avg:96.1 F (35.6 C), Min:96.1 F (35.6 C), Max:96.1 F (35.6 C)  Recent Labs  Lab 11/03/2018 2231  CREATININE 4.60*    Estimated Creatinine Clearance: 19.4 mL/min (A) (by C-G formula based on SCr of 4.6 mg/dL (H)).    Allergies  Allergen Reactions  . Other Anaphylaxis    Melons  . Fish Allergy Diarrhea  . Fruit & Vegetable Daily [Nutritional Supplements] Diarrhea    Have to be cooked or vine-ripened  . Lactose Intolerance (Gi) Diarrhea  . Naproxen Other (See Comments)    Makes her "congested"  . Sulfa Antibiotics Hives  . Erythromycin Hives and Rash  . Latex Rash  . Mupirocin Rash  . Tape Rash and Other (See Comments)    Plastic tape (alo) BURNS, but Tegaderm is tolerated    Antimicrobials this admission: vancomycin 5/16 >>  cefepime 5/16 >>   Dose adjustments this admission:   Microbiology results: 5/16 BCx: pending   Thank you for allowing pharmacy to be a part of this patient's care.   Loura Back, PharmD, BCPS Clinical Pharmacist Clinical phone for 10/10/2018 until 10p is [phone] 11/04/2018 10:35 PM  **Pharmacist phone directory can now be found on [email or web address] listed under [facility]**

## 2018-10-23 NOTE — H&P (Addendum)
..   NAME:  Rachael Robertson, MRN:  [id number], DOB:  1950-11-03, LOS: 0 ADMISSION DATE:  11/03/2018, CONSULTATION DATE:  11/04/2018 REFERRING MD:  Dr. Billy Fischer CHIEF COMPLAINT:  Acute Heart Block and pain in RLQ wound     Brief History   68 year old morbidly obese female with a past medical history significant for CHF, COPD, OHS, chronic respiratory failure with a cuffless trach in place presents with right lower quadrant wound and septic shock with complete AV block requiring external placing and Dopamine, found to be hyperkalemic and in Acute renal failure secondary to ATN requiring emergent CRRT. PCCM cons for admission  History of present illness   (History was obtained from EMR and the account of other providers patient is verbal very anxious not a great historian)  68 year old morbidly obese female with a past medical history significant for CHF COPD, previous PE, OHS, chronic respiratory failure with a cuffless tracheostomy in place and is normally on BiPAP AVAPS and to [facility] ED on 5/16 when home health aide found patient with an unstageable wound in the right lower quadrant with worrisome bulging of soft tissue.  She was complaining of shortness of breath for the past 3 days and EMS was called.  From the account of the ED provider Dr. Billy Fischer:  Patient's initial blood pressures were stable on arrival to the [facility] ED. was to BP documented by nurse Rolena Infante 69/43 mmHg with HR 29, 33 R, 100% O2 patient became bradycardic, and required pacing with labs returning showing severe hyperkalemia. Along with consults to Nephrology and Surgery, PCCM was asked to evaluate for admission.  On my evaluation: pt alert awake but uncomfortable, being externally paced, started on Dopamine. No Central access in place.  Discussed with Nephrology Dr Johnney Ou and emergent CRRT is warranted in this patient.   Past Medical History  .Marland Kitchen Active Ambulatory Problems    Diagnosis Date Noted  . Chronic respiratory failure  with hypoxia ([place]) 05/07/2018  . Hemoptysis   . OSA (obstructive sleep apnea)   . Tracheostomy status ([place])   . Hypoxemia   . Lethargy   . Acute on chronic systolic heart failure ([place]) 05/20/2018  . Arthritis 05/20/2018  . Other chronic pain 05/20/2018  . Right Lower Lobe PNU  05/28/2018  . Tracheostomy complication ([place])   . Subcutaneous hematoma 07/26/2018  . Acute blood loss anemia 07/26/2018  . Hematuria 07/26/2018  . Lymphedema of both lower extremities 08/10/2018  . Leg wound, right 08/10/2018  . Acute on chronic respiratory failure with hypoxia ([place]) 08/18/2018  . Acute pulmonary edema ([facility])   . Morbid obesity ([place])   . Hypothyroidism   . Chronic gout without tophus   . Hx of pulmonary embolus   . Hematoma of leg, right, initial encounter   . Oxygen dependent   . Abdominal wall hematoma   . Right lower quadrant abdominal mass 10/21/2018  . Acute renal failure ([place])   . Bradycardia, severe sinus   . Hyperkalemia   . Septic shock ([place])   . Encounter for continuous renal replacement therapy (CRRT) for acute renal failure [facility])    Resolved Ambulatory Problems    Diagnosis Date Noted  . No Resolved Ambulatory Problems   Past Medical History:  Diagnosis Date  . Asthma   . CHF (congestive heart failure) ([place])   . COPD (chronic obstructive pulmonary disease) ([place])   . Obesity   . Opiate use   . Renal disorder   . Tracheostomy in place [facility])  Significant Hospital Events   Externally paced Hyperkalemia >7  Consults:  10/28/2018>>>>Surgery 10/14/2018>>>>Nephrology 11/04/2018>>>>>Cardiology 10/13/2018>>>>>>PCCM Procedures:  10/24/2018>>>>Left IJ CVC for RRT  Significant Diagnostic Tests:  Labs: Sodium 126 potassium greater than 7.5 chloride 92 bicarb 24 BUN 121 creatinine 3.99 anion gap 10 alk phos 298 AST 22 ALT 19 GFR 11  White count 19.4 hemoglobin 10.1 hematocrit 34 platelets 337.  Differential: Neutrophils 78 immature granulocytes 13 smudge cells present  on smear Glucose 77  SARS-CoV-2 negative  EKG: Complete AV block with wide QRS complex Right bundle branch block>>>>>>> heart rate in the 20s.  QTC 427  Micro Data:  10/28/2018>>>>>>>SARSCOV2 negative 11/01/2018>>>>>>> Blood cx x 2 drawn  Antimicrobials:  10/09/2018>>>>>Vancomycin 11/04/2018>>>>>>Cefepime 10/28/2018>>>>>>>Flagyl 10/24/2018>>>>>Clindamycin   Interim history/subjective:  BP and HR improved with Dopamine and external pacing Seen by Nephrology>>>> CRRT ASAP  Objective   Blood pressure (!) 100/48, pulse 68, temperature (!) 96.1 F (35.6 C), temperature source Axillary, resp. rate 17, weight (!) 183.3 kg, SpO2 100 %.       No intake or output data in the 24 hours ending 10/11/2018 2319 Filed Weights   10/31/2018 2009  Weight: (!) 183.3 kg    Examination: General: Alert awake anxious female in obvious discomfort HENT: Normocephalic, pupils equal and reactive, tracheostomy in place on TC _0  Lungs: Decreased breath sounds due to body habitus, no wheezing appreciated Cardiovascular: S1 and S2 appreciated pacer pads in place, rate dependent on external pacer Abdomen: Very obese abdomen soft extremely tender in the right lower quadrant, gaping wound with necrotic purulent soft tissue appreciated in the RLQ Extremities: Erythematous swollen non-pitting edema, left lower extremity cellulitic changes Neuro: Alert awake oriented follows commands no focal deficit appreciated                       Assessment & Plan:  1.  Acute renal failure secondary to possible ATN BUN 121 creatinine 3.99 potassium greater than 7.5 Appreciate nephrology's consult Plan: Placed left IJ CVC Trialysis for emergent CRRT CRRT orders per Renal F/u electrolytes and CMP  2.  Cardiogenic and Septic shock Sepsis Source: right lower quadrant wound with necrotic continuous and soft tissue involvement. active infection>>>sepsis with vasodilation but also ppt ATN and eventual  ARF>>profound metabolic derangements and hyperkalemia contributing to Bradycardia. Plan: MAP goal >23mHg Continue on Dopamine>> MAP is rate dept. Starting CRRT>>correction of acidosis and metabolic derangements will help with hemodynamics. Pt refusing Arterial sticks and A-line at this time F/u Blood cultures Empiric antibiotics started in ED   3. Unstable Bradycardia: Complete heart block with escape rhythm in the 20s Seen by Cardiology appreciate recs Patient is not transvenous pacemaker candidate at this time point per Cardiology Reversible cause Plan: Correct metabolic derangement with CRRT and see improvement in HR Continue Dopamine and external pacing at this time Continuous Cardiac monitoring  4. RLQ Abdominal wound  Wound has subcutaneous fluid collection Pt states it has been there for years- not sure when it became necrotic or started draining Seen and evaluated by Surgery>>appreciate their recs Plan: CT A/P prior to arriving to ICU Abx coverage discussed with Surgery: Vancomycin, Cefepime, Flagyl and Clindamycin Pt will need surgical debridement of wound when K is corrected  5. Chronic Respiratory Failure Multifactorial: h/o Asthma, COPD, OHS, and OSA Has a cuffless tracheostomy tube At home on BIpap AVAPS Plan: Restart on home settings If WOB increases, desaturates or clinically worsens will switch trach to cuffed and connect to mechanical ventilation. Not actively wheezing>>>no steroids  at this time SARSCOV2 negative  Best practice:  Diet: N.p.o. Pain/Anxiety/Delirium protocol (if indicated): On hold due to blood pressure and heart rate VAP protocol (if indicated): Not intubated DVT prophylaxis: Heparin subcu, SCDs if tolerated GI prophylaxis: Protonix IV Glucose control: Currently hypoglycemic not requiring insulin sliding scale Mobility: Bedrest bariatric bed Code Status: Full Family Communication: Per EMR Disposition: ICU Prognosis: Patient has a  high risk of decompensation given multi organ failure  Labs   CBC: Recent Labs  Lab 10/26/2018 2224 10/13/2018 2231 10/15/2018 2312  WBC PENDING  --   --   NEUTROABS PENDING  --   --   HGB 10.1* 11.9* 11.6*  HCT 34.0* 35.0* 34.0*  MCV 85.9  --   --   PLT 337  --   --     Basic Metabolic Panel: Recent Labs  Lab 10/15/2018 2224 10/13/2018 2231 10/08/2018 2312  NA 126* 123* 123*  K PENDING 7.5* 7.2*  CL 92* 95* 96*  CO2 24  --   --   GLUCOSE 77 74 131*  BUN 121* 124* 126*  CREATININE 3.99* 4.60* 4.60*  CALCIUM 8.8*  --   --    GFR: Estimated Creatinine Clearance: 19.4 mL/min (A) (by C-G formula based on SCr of 4.6 mg/dL (H)). Recent Labs  Lab 10/08/2018 2224  WBC PENDING    Liver Function Tests: Recent Labs  Lab 11/07/2018 2224  AST 22  ALT 19  ALKPHOS 298*  BILITOT 0.7  PROT 6.9  ALBUMIN 1.3*   No results for input(s): LIPASE, AMYLASE in the last 168 hours. No results for input(s): AMMONIA in the last 168 hours.  ABG    Component Value Date/Time   PHART 7.393 06/11/2018 1100   PCO2ART 69.7 (HH) 06/11/2018 1100   PO2ART 69.1 (L) 06/11/2018 1100   HCO3 41.6 (H) 06/11/2018 1100   TCO2 23 10/22/2018 2312   O2SAT 94.3 06/11/2018 1100     Coagulation Profile: No results for input(s): INR, PROTIME in the last 168 hours.  Cardiac Enzymes: No results for input(s): CKTOTAL, CKMB, CKMBINDEX, TROPONINI in the last 168 hours.  HbA1C: No results found for: HGBA1C  CBG: Recent Labs  Lab 11/06/2018 2237 10/29/2018 2251  GLUCAP 79 152*    Review of Systems:   Marland KitchenMarland KitchenReview of Systems  Unable to perform ROS: Critical illness     Past Medical History  She,  has a past medical history of Arthritis, Asthma, CHF (congestive heart failure) ([place]), COPD (chronic obstructive pulmonary disease) ([place]), Obesity, Opiate use, Renal disorder, and Tracheostomy in place [facility]).   Surgical History    Past Surgical History:  Procedure Laterality Date  . HEMATOMA EVACUATION Right  07/29/2018   Procedure: EVACUATION HEMATOMA OF RIGHT LEG WITH  ACELL AND VAC PLACEMENT;  Surgeon: Wallace Going, DO;  Location: [place];  Service: Plastics;  Laterality: Right;  . Perianal abscess    . TRACHELECTOMY       Social History   reports that she is a non-smoker but has been exposed to tobacco smoke. She has never used smokeless tobacco. She reports current alcohol use. She reports previous drug use.   Family History   Her family history includes Aneurysm in her mother; Breast cancer in her sister; Heart disease in her father; Skin cancer in her father; Sleep apnea in her father.   Allergies Allergies  Allergen Reactions  . Other Anaphylaxis    Melons  . Fish Allergy Diarrhea  . Fruit & Vegetable Daily [Nutritional  Supplements] Diarrhea    Have to be cooked or vine-ripened  . Lactose Intolerance (Gi) Diarrhea  . Naproxen Other (See Comments)    Makes her "congested"  . Sulfa Antibiotics Hives  . Erythromycin Hives and Rash  . Latex Rash  . Mupirocin Rash  . Tape Rash and Other (See Comments)    Plastic tape (alo) BURNS, but Tegaderm is tolerated     Home Medications  Prior to Admission medications   Medication Sig Start Date End Date Taking? Authorizing Provider  acetaminophen (TYLENOL) 650 MG CR tablet Take 1,300 mg by mouth 2 (two) times daily.    [provider]  albuterol (VENTOLIN HFA) 108 (90 Base) MCG/ACT inhaler Inhale 1-2 puffs into the lungs every 6 (six) hours as needed for wheezing or shortness of breath. 10/21/18   Laurin Coder, MD  allopurinol (ZYLOPRIM) 100 MG tablet Take 100 mg by mouth 2 (two) times daily.    [provider]  budesonide-formoterol (SYMBICORT) 160-4.5 MCG/ACT inhaler Inhale 2 puffs into the lungs 2 (two) times daily.    [provider]  bumetanide (BUMEX) 2 MG tablet Take 1 tablet (2 mg total) by mouth daily. 08/25/18 08/25/19  Dorrell, Andree Elk, MD  colchicine 0.6 MG tablet Take 1 tablet (0.6 mg  total) by mouth daily. 08/03/18   Asencion Noble, MD  diclofenac sodium (VOLTAREN) 1 % GEL Apply 2 g topically 4 (four) times daily. Patient taking differently: Apply 2-4 g topically See admin instructions. Apply 2-4 grams topically to knees two times a day 05/20/18   Rehman, Areeg N, DO  doxycycline (VIBRA-TABS) 100 MG tablet Take 1 tablet (100 mg total) by mouth 2 (two) times daily. 09/23/18   Dillingham, Loel Lofty, DO  ferrous sulfate 325 (65 FE) MG tablet Take 1 tablet (325 mg total) by mouth daily with breakfast. 07/22/18 07/22/19  Dorrell, Andree Elk, MD  fluconazole (DIFLUCAN) 150 MG tablet Take 1 tablet (150 mg total) by mouth once a week. Patient taking differently: Take 150 mg by mouth every Wednesday.  07/27/18   Dorrell, Andree Elk, MD  guaiFENesin (MUCINEX) 600 MG 12 hr tablet Take 1,200 mg by mouth every 12 (twelve) hours.    [provider]  Hydrocortisone (GERHARDT'S BUTT CREAM) CREA Apply 1 application topically 2 (two) times daily. 07/22/18   Dorrell, Andree Elk, MD  ipratropium-albuterol (DUONEB) 0.5-2.5 (3) MG/3ML SOLN Take 3 mLs by nebulization every 6 (six) hours as needed (sob/wheezing). 10/22/18   Olalere, Ernesto Rutherford, MD  levothyroxine (SYNTHROID, LEVOTHROID) 50 MCG tablet Take 50 mcg by mouth daily before breakfast.    [provider]  loperamide (IMODIUM A-D) 2 MG tablet Take 2 mg by mouth 4 (four) times daily as needed for diarrhea or loose stools.    [provider]  omeprazole (PRILOSEC) 20 MG capsule Take 20 mg by mouth daily.    [provider]  potassium chloride SA (K-DUR,KLOR-CON) 20 MEQ tablet Take 40 mEq by mouth daily.     [provider]  pramipexole (MIRAPEX) 1 MG tablet Take 1 mg by mouth 2 (two) times daily.    [provider]  Probiotic Product (DAILY PROBIOTIC) CAPS Take 1 tablet by mouth daily. 08/04/18   Asencion Noble, MD  silver sulfADIAZINE (SILVADENE) 1 % cream Apply topically daily. Patient taking  differently: Apply 1 application topically daily.  07/06/18   Chundi, Verne Spurr, MD  spironolactone (ALDACTONE) 50 MG tablet Take 1 tablet (50 mg total) by mouth daily.  08/25/18   Dorrell, Andree Elk, MD  traMADol (ULTRAM) 50 MG tablet Take 1 tablet (50 mg total) by mouth every 6 (six) hours as needed for moderate pain. 10/21/18   Asencion Noble, MD   STAFF NOTE  I, Dr Seward Carol have personally reviewed patient's available data, including medical history, events of note, physical examination and test results as part of my evaluation. I have discussed with other care providers such as pharmacist, RN and Elink.  In addition,  I personally evaluated patient  The patient is critically ill with multiple organ systems failure and requires high complexity decision making for assessment and support, frequent evaluation and titration of therapies, application of advanced monitoring technologies and extensive interpretation of multiple databases.   Critical Care Time devoted to patient care services described in this note is  80 Minutes. This time reflects time of care of this signee Dr Seward Carol. This critical care time does not reflect procedure time, or teaching time or supervisory time but could involve care discussion time   Dr. Seward Carol Pulmonary Critical Care Medicine  10/24/2018 4:11 AM    Critical care time: 80 mins

## 2018-10-23 NOTE — ED Provider Notes (Signed)
[facility] EMERGENCY DEPARTMENT Provider Note   CSN: [id number] Arrival date & time: 11-02-2018  1942    History   Chief Complaint Chief Complaint  Patient presents with  . Wound Infection    HPI Rachael Robertson is a 68 y.o. female.     HPI   68yo female with history of CHF, COPD, obesity, chronic respiratory failure with tracheostomy in place, history of PE off of anticoagulants due to development of hematomas of abdominal wall right lower extremity who presents with concern for wound.  Home health called due to bulging of the wound.  Patient reports nausea, no vomiting.  She is unsure how long area has been red.  Has noted increasing pain for the last few days.  No cough.      Past Medical History:  Diagnosis Date  . Arthritis   . Asthma   . CHF (congestive heart failure) (HCC)   . COPD (chronic obstructive pulmonary disease) (HCC)   . Obesity   . Opiate use   . Renal disorder   . Tracheostomy in place [facility])     Patient Active Problem List   Diagnosis Date Noted  . Acute kidney injury (AKI) with acute tubular necrosis (ATN) (HCC) 10/24/2018  . Acute renal failure (HCC)   . Bradycardia, severe sinus   . Hyperkalemia   . Septic shock (HCC)   . Encounter for continuous renal replacement therapy (CRRT) for acute renal failure (HCC)   . Right lower quadrant abdominal mass 10/21/2018  . Abdominal wall hematoma   . Acute on chronic respiratory failure with hypoxia (HCC) 08/18/2018  . Acute pulmonary edema (HCC)   . Morbid obesity (HCC)   . Hypothyroidism   . Chronic gout without tophus   . Hx of pulmonary embolus   . Hematoma of leg, right, initial encounter   . Oxygen dependent   . Lymphedema of both lower extremities 08/10/2018  . Leg wound, right 08/10/2018  . Subcutaneous hematoma 07/26/2018  . Acute blood loss anemia 07/26/2018  . Hematuria 07/26/2018  . Tracheostomy complication (HCC)   . Right Lower Lobe PNU  05/28/2018  . Acute on  chronic systolic heart failure (HCC) 05/20/2018  . Arthritis 05/20/2018  . Other chronic pain 05/20/2018  . Lethargy   . Hemoptysis   . OSA (obstructive sleep apnea)   . Tracheostomy status (HCC)   . Hypoxemia   . Chronic respiratory failure with hypoxia (HCC) 05/07/2018    Past Surgical History:  Procedure Laterality Date  . HEMATOMA EVACUATION Right 07/29/2018   Procedure: EVACUATION HEMATOMA OF RIGHT LEG WITH  ACELL AND VAC PLACEMENT;  Surgeon: Peggye Form, DO;  Location: [facility] OR;  Service: Plastics;  Laterality: Right;  . Perianal abscess    . TRACHELECTOMY       OB History   No obstetric history on file.      Home Medications    Prior to Admission medications   Medication Sig Start Date End Date Taking? Authorizing Provider  acetaminophen (TYLENOL) 650 MG CR tablet Take 1,300 mg by mouth 2 (two) times daily.    [provider]  albuterol (VENTOLIN HFA) 108 (90 Base) MCG/ACT inhaler Inhale 1-2 puffs into the lungs every 6 (six) hours as needed for wheezing or shortness of breath. 10/21/18   Tomma Lightning, MD  allopurinol (ZYLOPRIM) 100 MG tablet Take 100 mg by mouth 2 (two) times daily.    [provider]  budesonide-formoterol (SYMBICORT) 160-4.5 MCG/ACT inhaler Inhale  2 puffs into the lungs 2 (two) times daily.    [provider]  bumetanide (BUMEX) 2 MG tablet Take 1 tablet (2 mg total) by mouth daily. 08/25/18 08/25/19  Dorrell, [name], MD  colchicine 0.6 MG tablet Take 1 tablet (0.6 mg total) by mouth daily. 08/03/18   [name], MD  diclofenac sodium (VOLTAREN) 1 % GEL Apply 2 g topically 4 (four) times daily. Patient taking differently: Apply 2-4 g topically See admin instructions. Apply 2-4 grams topically to knees two times a day 05/20/18   Rehman, Areeg N, DO  doxycycline (VIBRA-TABS) 100 MG tablet Take 1 tablet (100 mg total) by mouth 2 (two) times daily. 09/23/18   Dillingham, Alena Bills, DO  ferrous sulfate 325 (65 FE)  MG tablet Take 1 tablet (325 mg total) by mouth daily with breakfast. 07/22/18 07/22/19  Dorrell, [name], MD  fluconazole (DIFLUCAN) 150 MG tablet Take 1 tablet (150 mg total) by mouth once a week. Patient taking differently: Take 150 mg by mouth every Wednesday.  07/27/18   Dorrell, [name], MD  guaiFENesin (MUCINEX) 600 MG 12 hr tablet Take 1,200 mg by mouth every 12 (twelve) hours.    [provider]  Hydrocortisone (GERHARDT'S BUTT CREAM) CREA Apply 1 application topically 2 (two) times daily. 07/22/18   Dorrell, [name], MD  ipratropium-albuterol (DUONEB) 0.5-2.5 (3) MG/3ML SOLN Take 3 mLs by nebulization every 6 (six) hours as needed (sob/wheezing). 10/22/18   Olalere, Minna Antis, MD  levothyroxine (SYNTHROID, LEVOTHROID) 50 MCG tablet Take 50 mcg by mouth daily before breakfast.    [provider]  loperamide (IMODIUM A-D) 2 MG tablet Take 2 mg by mouth 4 (four) times daily as needed for diarrhea or loose stools.    [provider]  omeprazole (PRILOSEC) 20 MG capsule Take 20 mg by mouth daily.    [provider]  potassium chloride SA (K-DUR,KLOR-CON) 20 MEQ tablet Take 40 mEq by mouth daily.     [provider]  pramipexole (MIRAPEX) 1 MG tablet Take 1 mg by mouth 2 (two) times daily.    [provider]  Probiotic Product (DAILY PROBIOTIC) CAPS Take 1 tablet by mouth daily. 08/04/18   [name], MD  silver sulfADIAZINE (SILVADENE) 1 % cream Apply topically daily. Patient taking differently: Apply 1 application topically daily.  07/06/18   Chundi, Sherlyn Lees, MD  spironolactone (ALDACTONE) 50 MG tablet Take 1 tablet (50 mg total) by mouth daily. 08/25/18   Dorrell, [name], MD  traMADol (ULTRAM) 50 MG tablet Take 1 tablet (50 mg total) by mouth every 6 (six) hours as needed for moderate pain. 10/21/18   [name], MD    Family History Family History  Problem Relation Age of Onset  . Aneurysm Mother   . Heart disease  Father   . Skin cancer Father   . Sleep apnea Father        TRACHEOSTOMY AS WELL  . Breast cancer Sister     Social History Social History   Tobacco Use  . Smoking status: Passive Smoke Exposure - Never Smoker  . Smokeless tobacco: Never Used  Substance Use Topics  . Alcohol use: Yes    Comment: 1-2 per year   . Drug use: Not Currently     Allergies   Other; Fish allergy; Fruit & vegetable daily [nutritional supplements]; Lactose intolerance (gi); Naproxen; Sulfa antibiotics; Erythromycin; Latex; Mupirocin; and Tape   Review of Systems Review of Systems  Unable to perform ROS: Acuity of condition  Constitutional: Positive for fatigue. Negative for fever.  Gastrointestinal: Positive for abdominal pain and nausea. Negative for diarrhea and vomiting.     Physical Exam Updated Vital Signs BP (!) 66/37   Pulse 88   Temp (!) 96.1 F (35.6 C) (Axillary)   Resp 19   Wt (!) 183.3 kg   SpO2 97%   BMI 78.90 kg/m   Physical Exam Vitals signs and nursing note reviewed.  Constitutional:      General: She is not in acute distress.    Appearance: She is well-developed. She is ill-appearing. She is not diaphoretic.     Comments: Morbidly obese  HENT:     Head: Normocephalic and atraumatic.  Eyes:     Conjunctiva/sclera: Conjunctivae normal.  Neck:     Musculoskeletal: Normal range of motion.  Cardiovascular:     Rate and Rhythm: Normal rate and regular rhythm.     Heart sounds: Normal heart sounds. No murmur. No friction rub. No gallop.   Pulmonary:     Effort: Pulmonary effort is normal. No respiratory distress.     Breath sounds: Normal breath sounds. No wheezing or rales.  Abdominal:     General: There is no distension.     Palpations: Abdomen is soft.     Tenderness: There is abdominal tenderness.     Comments: Erythema right sided abdominal wall  Musculoskeletal:        General: No tenderness.  Skin:    General: Skin is warm and dry.     Findings: Erythema  present. No rash.     Comments: Necrotic wound RLQ/right flank, open area with concern for ?omentum, adipose?  LLE with posterior calf hematoma, desquamated skin overlying area    Neurological:     Mental Status: She is alert and oriented to person, place, and time.      ED Treatments / Results  Labs (all labs ordered are listed, but only abnormal results are displayed) Labs Reviewed  COMPREHENSIVE METABOLIC PANEL - Abnormal; Notable for the following components:      Result Value   Sodium 126 (*)    Potassium >7.5 (*)    Chloride 92 (*)    BUN 121 (*)    Creatinine, Ser 3.99 (*)    Calcium 8.8 (*)    Albumin 1.3 (*)    Alkaline Phosphatase 298 (*)    GFR calc non Af Amer 11 (*)    GFR calc Af Amer 13 (*)    All other components within normal limits  CBC WITH DIFFERENTIAL/PLATELET - Abnormal; Notable for the following components:   WBC 19.4 (*)    Hemoglobin 10.1 (*)    HCT 34.0 (*)    MCH 25.5 (*)    MCHC 29.7 (*)    RDW 18.5 (*)    nRBC 1.2 (*)    Neutro Abs 15.3 (*)    Abs Immature Granulocytes 2.45 (*)    All other components within normal limits  I-STAT CHEM 8, ED - Abnormal; Notable for the following components:   Sodium 123 (*)    Potassium 7.5 (*)    Chloride 95 (*)    BUN 124 (*)    Creatinine, Ser 4.60 (*)    Calcium, Ion 1.10 (*)    Hemoglobin 11.9 (*)    HCT 35.0 (*)    All other components within normal limits  CBG MONITORING, ED - Abnormal; Notable for the following components:  Glucose-Capillary 152 (*)    All other components within normal limits  I-STAT CHEM 8, ED - Abnormal; Notable for the following components:   Sodium 123 (*)    Potassium 7.2 (*)    Chloride 96 (*)    BUN 126 (*)    Creatinine, Ser 4.60 (*)    Glucose, Bld 131 (*)    Calcium, Ion 1.06 (*)    Hemoglobin 11.6 (*)    HCT 34.0 (*)    All other components within normal limits  CULTURE, BLOOD (ROUTINE X 2)  CULTURE, BLOOD (ROUTINE X 2)  SARS CORONAVIRUS 2 (HOSPITAL  ORDER, PERFORMED IN [facility])  LACTIC ACID, PLASMA  LACTIC ACID, PLASMA  URINALYSIS, ROUTINE W REFLEX MICROSCOPIC  RENAL FUNCTION PANEL  MAGNESIUM  RENAL FUNCTION PANEL  CBG MONITORING, ED    EKG None  Radiology Dg Chest Portable 1 View  Result Date: 10/24/2018 CLINICAL DATA:  Central line placement EXAM: PORTABLE CHEST 1 VIEW COMPARISON:  10/30/2018 FINDINGS: Left IJ approach central venous catheter tip in the proximal right atrium. Examination otherwise unchanged. IMPRESSION: L IJ CVC tip in the proximal right atrium. Electronically Signed   By: Deatra RobinsonKevin  Herman M.D.   On: 10/24/2018 01:04   Dg Chest Port 1 View  Result Date: 10/17/2018 CLINICAL DATA:  Sepsis EXAM: PORTABLE CHEST 1 VIEW COMPARISON:  08/21/2018 FINDINGS: Tracheostomy tube tip projects over the upper trachea. Mild cardiomegaly. No focal airspace consolidation or pulmonary edema. IMPRESSION: No active disease. Electronically Signed   By: Deatra RobinsonKevin  Herman M.D.   On: 10/19/2018 22:30    Procedures .Critical Care Performed by: Alvira MondaySchlossman, Tilford Deaton, MD Authorized by: Alvira MondaySchlossman, Shironda Kain, MD   Critical care provider statement:    Critical care time (minutes):  30   Critical care was time spent personally by me on the following activities:  Blood draw for specimens, discussions with consultants, ordering and review of radiographic studies, ordering and review of laboratory studies and pulse oximetry Angiocath insertion Date/Time: 10/24/2018 2:01 AM Performed by: Alvira MondaySchlossman, Avanelle Pixley, MD Authorized by: Alvira MondaySchlossman, Azaliyah Kennard, MD  Consent: Verbal consent obtained. Risks and benefits: risks, benefits and alternatives were discussed Required items: required blood products, implants, devices, and special equipment available Patient identity confirmed: verbally with patient Time out: Immediately prior to procedure a "time out" was called to verify the correct patient, procedure, equipment, support staff and site/side marked as  required. Preparation: Patient was prepped and draped in the usual sterile fashion. Patient tolerance: Patient tolerated the procedure well with no immediate complications Comments: RUE    (including critical care time)  Medications Ordered in ED Medications  ceFEPIme (MAXIPIME) 2 g in sodium chloride 0.9 % 100 mL IVPB (0 g Intravenous Hold 10/24/18 0050)  metroNIDAZOLE (FLAGYL) IVPB 500 mg (has no administration in time range)  sodium bicarbonate injection 50 mEq (50 mEq Intravenous Not Given 10/24/18 0057)  vancomycin (VANCOCIN) 2,500 mg in sodium chloride 0.9 % 500 mL IVPB (2,500 mg Intravenous Not Given 10/24/18 0052)  sodium zirconium cyclosilicate (LOKELMA) packet 10 g (10 g Oral Not Given 10/15/2018 2323)  heparin injection 1,000-6,000 Units (has no administration in time range)  prismasol BGK 2/2.5 replacement solution (has no administration in time range)  prismasol BGK 2/2.5 replacement solution (has no administration in time range)  prismasol BGK 0/2.5 infusion (has no administration in time range)  heparin injection 1,000 Units (has no administration in time range)  ceFEPIme (MAXIPIME) 2 g in sodium chloride 0.9 % 100 mL IVPB (has no  administration in time range)  clindamycin (CLEOCIN) IVPB 600 mg (has no administration in time range)  heparin injection 5,000 Units (has no administration in time range)  pantoprazole (PROTONIX) injection 40 mg (has no administration in time range)  ondansetron (ZOFRAN) injection 4 mg (has no administration in time range)  0.9 %  sodium chloride infusion (has no administration in time range)  DOPamine (INTROPIN) 800 mg in dextrose 5 % 250 mL (3.2 mg/mL) infusion (has no administration in time range)  ipratropium-albuterol (DUONEB) 0.5-2.5 (3) MG/3ML nebulizer solution 3 mL (has no administration in time range)  metroNIDAZOLE (FLAGYL) IVPB 500 mg (has no administration in time range)  sodium chloride 0.9 % bolus 3,000 mL (3,000 mLs Intravenous New  Bag/Given 10/27/2018 2253)  fentaNYL (SUBLIMAZE) 100 MCG/2ML injection (50 mcg  Given 11/04/2018 2308)  insulin aspart (novoLOG) injection 10 Units (10 Units Intravenous Given 10/13/2018 2040)  dextrose 50 % solution 50 mL (50 mLs Intravenous Given 11/01/2018 2039)  atropine injection (1 mg Intravenous Given 10/29/2018 2225)  calcium chloride injection (1 g Intravenous Given 11/07/2018 2226)  0.9 %  sodium chloride infusion (500 mLs Intravenous New Bag/Given 10/08/2018 2232)  DOPamine (INTROPIN) 800 mg in dextrose 5 % 250 mL (3.2 mg/mL) infusion (5 mcg/kg/min  183.3 kg Intravenous New Bag/Given 10/10/2018 2238)     Initial Impression / Assessment and Plan / ED Course  I have reviewed the triage vital signs and the nursing notes.  Pertinent labs & imaging results that were available during my care of the patient were reviewed by me and considered in my medical decision making (see chart for details).       68yo female with history of CHF, COPD, obesity, chronic respiratory failure with tracheostomy in place on 2-6L/min, history of PE off of anticoagulants due to development of hematomas of abdominal wall right lower extremity who presents with concern for wound.   Initial blood pressures stable on arrival to ED, repeat values noted to be labile with some values down to 80s and others up to 100s.  Difficulty obtaining IV access or blood work on arrival.  US guided IV placed and blood work obtained. Ordered cefepime/vanc/flagyl for cellulitis/intraabdominal source of infection and 3L of NS.  CT abdomen and surgery consult ordered with concern for necrotic wound, question exposed omentum on exam versus adipose.  After obtaining labs and placing initial orders, patient became critically unstable while I was with another critical patient and Dr. Jodi Mourning assisted with critical care as patient became bradycardic, and required pacing with labs returning showing severe hyperkalemia.  He consulted nephrology and called  critical care.   Critical care came to bedside and placed vascath.  CT abdomen ordered unclear if body habitus will allow this. Dr. Derrell Lolling of General Surgery evaluated and surgery will follow.    Final Clinical Impressions(s) / ED Diagnoses   Final diagnoses:  Cellulitis of abdominal wall  Chronic wound infection of abdomen, initial encounter  Sepsis, due to unspecified organism, unspecified whether acute organ dysfunction present [facility])  Acute renal failure, unspecified acute renal failure type (HCC)  Hyperkalemia  Bradycardia    ED Discharge Orders    None       Alvira Monday, MD 10/24/18 0202

## 2018-10-23 NOTE — Progress Notes (Signed)
Cardiology Consult    Patient ID: Rachael Robertson MRN: [id number], DOB/AGE: [birth year]   Admit date: 10/22/2018 Date of Consult: 10/21/2018  Primary Physician: Lorenso Courierhundi, Vahini, MD Primary Cardiologist: No primary care provider on file.  Past Medical History   Past Medical History:  Diagnosis Date  . Arthritis   . Asthma   . CHF (congestive heart failure) (HCC)   . COPD (chronic obstructive pulmonary disease) (HCC)   . Obesity   . Opiate use   . Renal disorder   . Tracheostomy in place [facility](HCC)     Past Surgical History:  Procedure Laterality Date  . HEMATOMA EVACUATION Right 07/29/2018   Procedure: EVACUATION HEMATOMA OF RIGHT LEG WITH  ACELL AND VAC PLACEMENT;  Surgeon: Peggye Formillingham, Claire S, DO;  Location: [facility] OR;  Service: Plastics;  Laterality: Right;  . Perianal abscess    . TRACHELECTOMY       Allergies  Allergies  Allergen Reactions  . Other Anaphylaxis    Melons  . Fish Allergy Diarrhea  . Fruit & Vegetable Daily [Nutritional Supplements] Diarrhea    Have to be cooked or vine-ripened  . Lactose Intolerance (Gi) Diarrhea  . Naproxen Other (See Comments)    Makes her "congested"  . Sulfa Antibiotics Hives  . Erythromycin Hives and Rash  . Latex Rash  . Mupirocin Rash  . Tape Rash and Other (See Comments)    Plastic tape (alo) BURNS, but Tegaderm is tolerated    History of Present Illness    68-year-old morbidly obese woman with a past medical history of OSA permanent trach, PE who has a new onset complete heart block.  She was recently seen in clinic for right lower quadrant pain.  Today found to have AKI and potassium in 7 range.  Blood pressure in the 70s systolic on arrival.  Patient is in acute distress but conscious.  External pacing was initiated.  Dopamine was started.  Fluid is going in.  Inpatient Medications    . dextrose  50 mL Intravenous Once  . fentaNYL      . insulin aspart  10 Units Intravenous Once  . sodium bicarbonate  50 mEq Intravenous  Once  . sodium zirconium cyclosilicate  10 g Oral Once    Family History    Family History  Problem Relation Age of Onset  . Aneurysm Mother   . Heart disease Father   . Skin cancer Father   . Sleep apnea Father        TRACHEOSTOMY AS WELL  . Breast cancer Sister    She indicated that her mother is deceased. She indicated that her father is deceased. She indicated that the status of her sister is unknown.   Social History    Social History   Socioeconomic History  . Marital status: Divorced    Spouse name: Not on file  . Number of children: Not on file  . Years of education: Not on file  . Highest education level: Not on file  Occupational History  . Not on file  Social Needs  . Financial resource strain: Not on file  . Food insecurity:    Worry: Not on file    Inability: Not on file  . Transportation needs:    Medical: Not on file    Non-medical: Not on file  Tobacco Use  . Smoking status: Passive Smoke Exposure - Never Smoker  . Smokeless tobacco: Never Used  Substance and Sexual Activity  . Alcohol use: Yes  Comment: 1-2 per year   . Drug use: Not Currently  . Sexual activity: Not on file  Lifestyle  . Physical activity:    Days per week: Not on file    Minutes per session: Not on file  . Stress: Not on file  Relationships  . Social connections:    Talks on phone: Not on file    Gets together: Not on file    Attends religious service: Not on file    Active member of club or organization: Not on file    Attends meetings of clubs or organizations: Not on file    Relationship status: Not on file  . Intimate partner violence:    Fear of current or ex partner: Not on file    Emotionally abused: Not on file    Physically abused: Not on file    Forced sexual activity: Not on file  Other Topics Concern  . Not on file  Social History Narrative  . Not on file     Review of Systems    General:  No chills, fever, night sweats or weight changes.   Cardiovascular:  No chest pain, dyspnea on exertion, edema, orthopnea, palpitations, paroxysmal nocturnal dyspnea. Dermatological: No rash, lesions/masses Respiratory: No cough, dyspnea Urologic: No hematuria, dysuria Abdominal:   No nausea, vomiting, diarrhea, bright red blood per rectum, melena, or hematemesis Neurologic:  No visual changes, wkns, changes in mental status. All other systems reviewed and are otherwise negative except as noted above.  Physical Exam    Blood pressure (!) 100/48, pulse 68, temperature (!) 96.1 F (35.6 C), temperature source Axillary, resp. rate 17, weight (!) 183.3 kg, SpO2 100 %.  General: In distress, morbidly obese Psych: Anxious Neuro: Alert and oriented X 3. Moves all extremities spontaneously. HEENT: Normal  Neck: Supple without bruits or JVD. Lungs:  Resp regular and unlabored, CTA. Heart: Regular pulse Abdomen: Soft, non-tender, non-distended, BS + x 4.  Extremities: No clubbing, cyanosis or edema. DP/PT/Radials 2+ and equal bilaterally.  Labs    Troponin (Point of Care Test) No results for input(s): TROPIPOC in the last 72 hours. No results for input(s): CKTOTAL, CKMB, TROPONINI in the last 72 hours. Lab Results  Component Value Date   WBC PENDING 11-07-18   HGB 11.6 (L) Nov 07, 2018   HCT 34.0 (L) 11-07-18   MCV 85.9 2018/11/07   PLT 337 11-07-18    Recent Labs  Lab 11/07/2018 2224  11/07/18 2312  NA 126*   < > 123*  K PENDING   < > 7.2*  CL 92*   < > 96*  CO2 24  --   --   BUN 121*   < > 126*  CREATININE 3.99*   < > 4.60*  CALCIUM 8.8*  --   --   PROT 6.9  --   --   BILITOT 0.7  --   --   ALKPHOS 298*  --   --   ALT 19  --   --   AST 22  --   --   GLUCOSE 77   < > 131*   < > = values in this interval not displayed.   No results found for: CHOL, HDL, LDLCALC, TRIG No results found for: [facility]   Radiology Studies    Dg Chest Port 1 View  Result Date: 2018-11-07 CLINICAL DATA:  Sepsis EXAM: PORTABLE CHEST 1 VIEW  COMPARISON:  08/21/2018 FINDINGS: Tracheostomy tube tip projects over the upper trachea. Mild cardiomegaly. No  focal airspace consolidation or pulmonary edema. IMPRESSION: No active disease. Electronically Signed   By: Deatra Robinson M.D.   On: 2018/11/22 22:30    ECG & Cardiac Imaging    Complete heart block with escape rhythm in the 20s Assessment & Plan    68 year old woman with acute heart block in the setting of hyperkalemia.  Medical management of hyperkalemia has been initiated.  Answer to the ICU is ongoing.  Is externally paced.  Dopamine was started.  -Continue on dopa and external pacing.  Patient is not transvenous pacemaker candidate at this time point.  Hopefully given the reversible cause of her heart block she will resume normal sinus rhythm once potassium is lowered -Renal consult for potential dialysis as well  Signed, Macario Golds, MD 11-22-18, 11:24 PM  For questions or updates, please contact   Please consult [email or web address] for contact info under Cardiology/STEMI.

## 2018-10-23 NOTE — ED Triage Notes (Signed)
Per EMS, was called by patients home health due to unstageable wound on patients right hip that has taken on a new appearance. A part of the wound now has bulged out. Also c/o SOB for the past 3 days.

## 2018-10-23 NOTE — Consult Note (Signed)
[facility]  INPATIENT CONSULTATION  Reason for Consultation: hyperkalemia, AKI Requesting Provider: Dr. Jodi Mourning  HPI: Rachael Robertson is an 68 y.o. 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  Nephrology is consulted for management for hyperkalemia and AKI.   Pt sent to ED by home health due to worsening appearance of a right abdominal wound.  On presentation labs showed K 7.5 and she developed complete heart block and cardiogenic shock requiring external pacing and dopamine infusion.  Cardiology has consulted; rec cont external pacing, correct K.  She received insulin, glucose, IV calcium, bicarb.  No albuterol given due to COVID19 not yet ruled out.  Nephrology was consulted, recommended placement of dialysis catheter via telephone to ED.  Due to patient size there was concern for placing dialysis catheter.  PCCM has been consulted and plans to do so.    Pt in pain due to abdominal wound and external pacing currently.  Other history limited by patient's current critical status.  She is aware of need for emergent dialysis and says she has had dialysis in the past.    PMH: Past Medical History:  Diagnosis Date  . Arthritis   . Asthma   . CHF (congestive heart failure) (HCC)   . COPD (chronic obstructive pulmonary disease) (HCC)   . Obesity   . Opiate use   . Renal disorder   . Tracheostomy in place [facility])    PSH: Past Surgical History:  Procedure Laterality Date  . HEMATOMA EVACUATION Right 07/29/2018   Procedure: EVACUATION HEMATOMA OF RIGHT LEG WITH  ACELL AND VAC PLACEMENT;  Surgeon: Peggye Form, DO;  Location: [facility] OR;  Service: Plastics;  Laterality: Right;  . Perianal abscess    . TRACHELECTOMY      Past Medical History:  Diagnosis Date  . Arthritis   . Asthma   . CHF (congestive  heart failure) (HCC)   . COPD (chronic obstructive pulmonary disease) (HCC)   . Obesity   . Opiate use   . Renal disorder   . Tracheostomy in place [facility])     Medications:  I have reviewed the patient's current medications.  (Not in a hospital admission)   ALLERGIES:   Allergies  Allergen Reactions  . Other Anaphylaxis    Melons  . Fish Allergy Diarrhea  . Fruit & Vegetable Daily [Nutritional Supplements] Diarrhea    Have to be cooked or vine-ripened  . Lactose Intolerance (Gi) Diarrhea  . Naproxen Other (See Comments)    Makes her "congested"  . Sulfa Antibiotics Hives  . Erythromycin Hives and Rash  . Latex Rash  . Mupirocin Rash  . Tape Rash and Other (See Comments)    Plastic tape (alo) BURNS, but Tegaderm is tolerated    FAM HX: Family History  Problem Relation Age of Onset  . Aneurysm Mother   . Heart disease Father   . Skin cancer Father   . Sleep apnea Father        TRACHEOSTOMY AS WELL  . Breast cancer Sister     Social History:   reports that she is a non-smoker but has been exposed to tobacco smoke. She has never used smokeless tobacco. She reports current alcohol use. She reports previous drug use.  ROS: unable to obtain due to patient factors  Blood pressure (!) 100/48,  pulse 68, temperature (!) 96.1 F (35.6 C), temperature source Axillary, resp. rate 17, weight (!) 183.3 kg, SpO2 100 %. PHYSICAL EXAM: Gen: morbidly obese  Eyes: anicteric, glasses ENT: MM tacky Neck: thick, trach in place CV:  Paced externally Abd:  Obese, R side with erythematous skin with deep erythema into lateral fold with denuded skin and what appears to be skin defect with stool containing bowel protruding Back: no examined Extr: chronic ruddy discoloration, 1+ chronic appearing pitting LE edema Neuro: awake, talking some    Results for orders placed or performed during the hospital encounter of Nov 19, 2018 (from the past 48 hour(s))  Comprehensive metabolic panel      Status: Abnormal (Preliminary result)   Collection Time: 11-19-2018 10:24 PM  Result Value Ref Range   Sodium 126 (L) 135 - 145 mmol/L   Potassium PENDING 3.5 - 5.1 mmol/L   Chloride 92 (L) 98 - 111 mmol/L   CO2 24 22 - 32 mmol/L   Glucose, Bld 77 70 - 99 mg/dL   BUN 371 (H) 8 - 23 mg/dL   Creatinine, Ser 0.62 (H) 0.44 - 1.00 mg/dL   Calcium 8.8 (L) 8.9 - 10.3 mg/dL   Total Protein 6.9 6.5 - 8.1 g/dL   Albumin 1.3 (L) 3.5 - 5.0 g/dL   AST 22 15 - 41 U/L   ALT 19 0 - 44 U/L   Alkaline Phosphatase 298 (H) 38 - 126 U/L   Total Bilirubin 0.7 0.3 - 1.2 mg/dL   GFR calc non Af Amer 11 (L) >60 mL/min   GFR calc Af Amer 13 (L) >60 mL/min   Anion gap 10 5 - 15    Comment: Performed at [facility], [address], [place], [place] [address]  CBC WITH DIFFERENTIAL     Status: Abnormal (Preliminary result)   Collection Time: 11/19/2018 10:24 PM  Result Value Ref Range   WBC PENDING 4.0 - 10.5 K/uL   RBC 3.96 3.87 - 5.11 MIL/uL   Hemoglobin 10.1 (L) 12.0 - 15.0 g/dL   HCT 46.2 (L) 70.3 - 50.0 %   MCV 85.9 80.0 - 100.0 fL   MCH 25.5 (L) 26.0 - 34.0 pg   MCHC 29.7 (L) 30.0 - 36.0 g/dL   RDW 93.8 (H) 18.2 - 99.3 %   Platelets 337 150 - 400 K/uL    Comment: Performed at [facility], [address] [place], [place] [address]   nRBC PENDING 0.0 - 0.2 %   Neutrophils Relative % PENDING %   Neutro Abs PENDING 1.7 - 7.7 K/uL   Band Neutrophils PENDING %   Lymphocytes Relative PENDING %   Lymphs Abs PENDING 0.7 - 4.0 K/uL   Monocytes Relative PENDING %   Monocytes Absolute PENDING 0.1 - 1.0 K/uL   Eosinophils Relative PENDING %   Eosinophils Absolute PENDING 0.0 - 0.5 K/uL   Basophils Relative PENDING %   Basophils Absolute PENDING 0.0 - 0.1 K/uL   WBC Morphology PENDING    RBC Morphology PENDING    Smear Review PENDING    Other PENDING %   nRBC PENDING 0 /100 WBC   Metamyelocytes Relative PENDING %   Myelocytes PENDING %   Promyelocytes Relative PENDING %   Blasts PENDING %   Blood Culture (routine x 2)     Status: None (Preliminary result)   Collection Time: November 19, 2018 10:24 PM  Result Value Ref Range   Specimen Description BLOOD LEFT ANTECUBITAL  Special Requests      BOTTLES DRAWN AEROBIC AND ANAEROBIC Blood Culture results may not be optimal due to an inadequate volume of blood received in culture bottles Performed at [facility], [address] [place], [place] [address]    Culture PENDING    Report Status PENDING   I-stat chem 8, ED [facility] only)     Status: Abnormal   Collection Time: Jul 09, 2018 10:31 PM  Result Value Ref Range   Sodium 123 (L) 135 - 145 mmol/L   Potassium 7.5 (HH) 3.5 - 5.1 mmol/L   Chloride 95 (L) 98 - 111 mmol/L   BUN 124 (H) 8 - 23 mg/dL   Creatinine, Ser 6.044.60 (H) 0.44 - 1.00 mg/dL   Glucose, Bld 74 70 - 99 mg/dL   Calcium, Ion 5.401.10 (L) 1.15 - 1.40 mmol/L   TCO2 25 22 - 32 mmol/L   Hemoglobin 11.9 (L) 12.0 - 15.0 g/dL   HCT 98.135.0 (L) 19.136.0 - 47.846.0 %   Comment NOTIFIED PHYSICIAN   CBG monitoring, ED     Status: None   Collection Time: Jul 09, 2018 10:37 PM  Result Value Ref Range   Glucose-Capillary 79 70 - 99 mg/dL  CBG monitoring, ED     Status: Abnormal   Collection Time: Jul 09, 2018 10:51 PM  Result Value Ref Range   Glucose-Capillary 152 (H) 70 - 99 mg/dL  I-stat chem 8, ED ([facility] only)     Status: Abnormal   Collection Time: Jul 09, 2018 11:12 PM  Result Value Ref Range   Sodium 123 (L) 135 - 145 mmol/L   Potassium 7.2 (HH) 3.5 - 5.1 mmol/L   Chloride 96 (L) 98 - 111 mmol/L   BUN 126 (H) 8 - 23 mg/dL   Creatinine, Ser 2.954.60 (H) 0.44 - 1.00 mg/dL   Glucose, Bld 621131 (H) 70 - 99 mg/dL   Calcium, Ion 3.081.06 (L) 1.15 - 1.40 mmol/L   TCO2 23 22 - 32 mmol/L   Hemoglobin 11.6 (L) 12.0 - 15.0 g/dL   HCT 65.734.0 (L) 84.636.0 - 96.246.0 %   Comment NOTIFIED PHYSICIAN     Dg Chest Port 1 View  Result Date: 2019/02/08 CLINICAL DATA:  Sepsis EXAM: PORTABLE CHEST 1 VIEW COMPARISON:  08/21/2018 FINDINGS: Tracheostomy tube tip projects  over the upper trachea. Mild cardiomegaly. No focal airspace consolidation or pulmonary edema. IMPRESSION: No active disease. Electronically Signed   By: Deatra RobinsonKevin  Herman M.D.   On: Jan 31, 202020 22:30    Assessment/Plan **Hyperkalemia:  Multifactorial etiology -AKI, sepsis, spironolactone, KCl on med list ? if taking.  Severe, pt in complete heart block requiring external pacing despite acute medical interventions to lower K.  Dialysis is indicated.  PCCM placing IJ catheter currently - appreciate, CRRT ASAP.  Follow serial electrolytes.     **AKI, severe:  Baseline renal function normal, pt presenting with what appears to be large intraabdominal wound and sepsis.  Likely ATN from underlying sepsis.  Supportive care to restore perfusion with IVF to euvolemia, antibiotics, source control.   **Sepsis:  Pt appears to have large right sided abdominal wound with cellulitis; it appears bowel is visible externally as well.  Broad spectrum antibiotics, imaging, surgery consult.  Patient certainly too unstable for any surgical intervention at this moment.    **Hyponatremia:  Serum sodium 123 which is likely multifactorial with contributions of hypovolemia, shock, possibly SIADH from pain.  Will improve with CRRT, just need to monitor so she doesn't correct too quickly.  Will follow serial labs and provide free water via D5W IV as needed.     Tyler Pita 2018/10/31, 11:35 PM

## 2018-10-24 ENCOUNTER — Inpatient Hospital Stay (HOSPITAL_COMMUNITY): Payer: Medicare Other

## 2018-10-24 ENCOUNTER — Emergency Department (HOSPITAL_COMMUNITY): Payer: Medicare Other

## 2018-10-24 DIAGNOSIS — N179 Acute kidney failure, unspecified: Secondary | ICD-10-CM | POA: Diagnosis not present

## 2018-10-24 DIAGNOSIS — I469 Cardiac arrest, cause unspecified: Secondary | ICD-10-CM

## 2018-10-24 DIAGNOSIS — I442 Atrioventricular block, complete: Secondary | ICD-10-CM | POA: Diagnosis not present

## 2018-10-24 DIAGNOSIS — E875 Hyperkalemia: Secondary | ICD-10-CM | POA: Insufficient documentation

## 2018-10-24 DIAGNOSIS — Z66 Do not resuscitate: Secondary | ICD-10-CM | POA: Diagnosis not present

## 2018-10-24 DIAGNOSIS — A419 Sepsis, unspecified organism: Secondary | ICD-10-CM | POA: Diagnosis not present

## 2018-10-24 DIAGNOSIS — Z515 Encounter for palliative care: Secondary | ICD-10-CM | POA: Diagnosis not present

## 2018-10-24 DIAGNOSIS — I4891 Unspecified atrial fibrillation: Secondary | ICD-10-CM | POA: Diagnosis present

## 2018-10-24 DIAGNOSIS — R001 Bradycardia, unspecified: Secondary | ICD-10-CM | POA: Diagnosis not present

## 2018-10-24 DIAGNOSIS — E162 Hypoglycemia, unspecified: Secondary | ICD-10-CM | POA: Diagnosis present

## 2018-10-24 DIAGNOSIS — J449 Chronic obstructive pulmonary disease, unspecified: Secondary | ICD-10-CM | POA: Diagnosis present

## 2018-10-24 DIAGNOSIS — E039 Hypothyroidism, unspecified: Secondary | ICD-10-CM | POA: Diagnosis present

## 2018-10-24 DIAGNOSIS — J9600 Acute respiratory failure, unspecified whether with hypoxia or hypercapnia: Secondary | ICD-10-CM | POA: Diagnosis not present

## 2018-10-24 DIAGNOSIS — J9611 Chronic respiratory failure with hypoxia: Secondary | ICD-10-CM | POA: Diagnosis present

## 2018-10-24 DIAGNOSIS — E861 Hypovolemia: Secondary | ICD-10-CM | POA: Diagnosis present

## 2018-10-24 DIAGNOSIS — M1A9XX0 Chronic gout, unspecified, without tophus (tophi): Secondary | ICD-10-CM | POA: Diagnosis present

## 2018-10-24 DIAGNOSIS — Z1159 Encounter for screening for other viral diseases: Secondary | ICD-10-CM | POA: Diagnosis not present

## 2018-10-24 DIAGNOSIS — R6521 Severe sepsis with septic shock: Secondary | ICD-10-CM | POA: Diagnosis present

## 2018-10-24 DIAGNOSIS — N170 Acute kidney failure with tubular necrosis: Secondary | ICD-10-CM | POA: Diagnosis present

## 2018-10-24 DIAGNOSIS — R570 Cardiogenic shock: Secondary | ICD-10-CM | POA: Diagnosis present

## 2018-10-24 DIAGNOSIS — L03311 Cellulitis of abdominal wall: Secondary | ICD-10-CM | POA: Diagnosis present

## 2018-10-24 DIAGNOSIS — S301XXA Contusion of abdominal wall, initial encounter: Secondary | ICD-10-CM | POA: Diagnosis present

## 2018-10-24 DIAGNOSIS — A4102 Sepsis due to Methicillin resistant Staphylococcus aureus: Secondary | ICD-10-CM | POA: Diagnosis present

## 2018-10-24 DIAGNOSIS — I5042 Chronic combined systolic (congestive) and diastolic (congestive) heart failure: Secondary | ICD-10-CM | POA: Diagnosis present

## 2018-10-24 DIAGNOSIS — M726 Necrotizing fasciitis: Secondary | ICD-10-CM | POA: Diagnosis present

## 2018-10-24 DIAGNOSIS — Z6841 Body Mass Index (BMI) 40.0 and over, adult: Secondary | ICD-10-CM | POA: Diagnosis not present

## 2018-10-24 DIAGNOSIS — L8990 Pressure ulcer of unspecified site, unspecified stage: Secondary | ICD-10-CM

## 2018-10-24 DIAGNOSIS — E871 Hypo-osmolality and hyponatremia: Secondary | ICD-10-CM | POA: Diagnosis not present

## 2018-10-24 DIAGNOSIS — E872 Acidosis: Secondary | ICD-10-CM | POA: Diagnosis present

## 2018-10-24 LAB — COMPREHENSIVE METABOLIC PANEL
ALT: 19 U/L (ref 0–44)
AST: 22 U/L (ref 15–41)
Albumin: 1.3 g/dL — ABNORMAL LOW (ref 3.5–5.0)
Alkaline Phosphatase: 298 U/L — ABNORMAL HIGH (ref 38–126)
Anion gap: 10 (ref 5–15)
BUN: 121 mg/dL — ABNORMAL HIGH (ref 8–23)
CO2: 24 mmol/L (ref 22–32)
Calcium: 8.8 mg/dL — ABNORMAL LOW (ref 8.9–10.3)
Chloride: 92 mmol/L — ABNORMAL LOW (ref 98–111)
Creatinine, Ser: 3.99 mg/dL — ABNORMAL HIGH (ref 0.44–1.00)
GFR calc Af Amer: 13 mL/min — ABNORMAL LOW (ref >60)
GFR calc non Af Amer: 11 mL/min — ABNORMAL LOW (ref >60)
Glucose, Bld: 77 mg/dL (ref 70–99)
Potassium: >7.5 mmol/L (ref 3.5–5.1)
Sodium: 126 mmol/L — ABNORMAL LOW (ref 135–145)
Total Bilirubin: 0.7 mg/dL (ref 0.3–1.2)
Total Protein: 6.9 g/dL (ref 6.5–8.1)

## 2018-10-24 LAB — BASIC METABOLIC PANEL
Anion gap: 16 — ABNORMAL HIGH (ref 5–15)
BUN: 110 mg/dL — ABNORMAL HIGH (ref 8–23)
CO2: 18 mmol/L — ABNORMAL LOW (ref 22–32)
Calcium: 8.6 mg/dL — ABNORMAL LOW (ref 8.9–10.3)
Chloride: 92 mmol/L — ABNORMAL LOW (ref 98–111)
Creatinine, Ser: 3.63 mg/dL — ABNORMAL HIGH (ref 0.44–1.00)
GFR calc Af Amer: 14 mL/min — ABNORMAL LOW (ref >60)
GFR calc non Af Amer: 12 mL/min — ABNORMAL LOW (ref >60)
Glucose, Bld: 222 mg/dL — ABNORMAL HIGH (ref 70–99)
Potassium: 6.9 mmol/L (ref 3.5–5.1)
Sodium: 126 mmol/L — ABNORMAL LOW (ref 135–145)

## 2018-10-24 LAB — POCT I-STAT 7, (LYTES, BLD GAS, ICA,H+H)
Acid-base deficit: 7 mmol/L — ABNORMAL HIGH (ref 0.0–2.0)
Acid-base deficit: 7 mmol/L — ABNORMAL HIGH (ref 0.0–2.0)
Acid-base deficit: 7 mmol/L — ABNORMAL HIGH (ref 0.0–2.0)
Acid-base deficit: 8 mmol/L — ABNORMAL HIGH (ref 0.0–2.0)
Bicarbonate: 22.5 mmol/L (ref 20.0–28.0)
Bicarbonate: 21.3 mmol/L (ref 20.0–28.0)
Bicarbonate: 19.4 mmol/L — ABNORMAL LOW (ref 20.0–28.0)
Bicarbonate: 17 mmol/L — ABNORMAL LOW (ref 20.0–28.0)
Calcium, Ion: 1.22 mmol/L (ref 1.15–1.40)
Calcium, Ion: 1.19 mmol/L (ref 1.15–1.40)
Calcium, Ion: 1.02 mmol/L — ABNORMAL LOW (ref 1.15–1.40)
Calcium, Ion: 1.08 mmol/L — ABNORMAL LOW (ref 1.15–1.40)
HCT: 37 % (ref 36.0–46.0)
HCT: 40 % (ref 36.0–46.0)
HCT: 35 % — ABNORMAL LOW (ref 36.0–46.0)
HCT: 34 % — ABNORMAL LOW (ref 36.0–46.0)
Hemoglobin: 12.6 g/dL (ref 12.0–15.0)
Hemoglobin: 13.6 g/dL (ref 12.0–15.0)
Hemoglobin: 11.9 g/dL — ABNORMAL LOW (ref 12.0–15.0)
Hemoglobin: 11.6 g/dL — ABNORMAL LOW (ref 12.0–15.0)
O2 Saturation: 100 %
O2 Saturation: 100 %
O2 Saturation: 99 %
O2 Saturation: 99 %
Patient temperature: 98.4
Patient temperature: 98.4
Patient temperature: 98
Patient temperature: 98
Potassium: 7.4 mmol/L (ref 3.5–5.1)
Potassium: 7.3 mmol/L (ref 3.5–5.1)
Potassium: 6.1 mmol/L — ABNORMAL HIGH (ref 3.5–5.1)
Potassium: 6.1 mmol/L — ABNORMAL HIGH (ref 3.5–5.1)
Sodium: 123 mmol/L — ABNORMAL LOW (ref 135–145)
Sodium: 125 mmol/L — ABNORMAL LOW (ref 135–145)
Sodium: 128 mmol/L — ABNORMAL LOW (ref 135–145)
Sodium: 126 mmol/L — ABNORMAL LOW (ref 135–145)
TCO2: 24 mmol/L (ref 22–32)
TCO2: 23 mmol/L (ref 22–32)
TCO2: 21 mmol/L — ABNORMAL LOW (ref 22–32)
TCO2: 18 mmol/L — ABNORMAL LOW (ref 22–32)
pCO2 arterial: 62.7 mmHg — ABNORMAL HIGH (ref 32.0–48.0)
pCO2 arterial: 54.9 mmHg — ABNORMAL HIGH (ref 32.0–48.0)
pCO2 arterial: 42.1 mmHg (ref 32.0–48.0)
pCO2 arterial: 32.3 mmHg (ref 32.0–48.0)
pH, Arterial: 7.162 — CL (ref 7.350–7.450)
pH, Arterial: 7.196 — CL (ref 7.350–7.450)
pH, Arterial: 7.269 — ABNORMAL LOW (ref 7.350–7.450)
pH, Arterial: 7.326 — ABNORMAL LOW (ref 7.350–7.450)
pO2, Arterial: 304 mmHg — ABNORMAL HIGH (ref 83.0–108.0)
pO2, Arterial: 231 mmHg — ABNORMAL HIGH (ref 83.0–108.0)
pO2, Arterial: 149 mmHg — ABNORMAL HIGH (ref 83.0–108.0)
pO2, Arterial: 131 mmHg — ABNORMAL HIGH (ref 83.0–108.0)

## 2018-10-24 LAB — MAGNESIUM: Magnesium: 2.4 mg/dL (ref 1.7–2.4)

## 2018-10-24 LAB — BLOOD CULTURE ID PANEL (REFLEXED)

## 2018-10-24 LAB — RENAL FUNCTION PANEL
Albumin: 1.2 g/dL — ABNORMAL LOW (ref 3.5–5.0)
Albumin: 1.2 g/dL — ABNORMAL LOW (ref 3.5–5.0)
Anion gap: 16 — ABNORMAL HIGH (ref 5–15)
Anion gap: 14 (ref 5–15)
BUN: 113 mg/dL — ABNORMAL HIGH (ref 8–23)
BUN: 94 mg/dL — ABNORMAL HIGH (ref 8–23)
CO2: 20 mmol/L — ABNORMAL LOW (ref 22–32)
CO2: 17 mmol/L — ABNORMAL LOW (ref 22–32)
Calcium: 8.8 mg/dL — ABNORMAL LOW (ref 8.9–10.3)
Calcium: 8.1 mg/dL — ABNORMAL LOW (ref 8.9–10.3)
Chloride: 90 mmol/L — ABNORMAL LOW (ref 98–111)
Chloride: 94 mmol/L — ABNORMAL LOW (ref 98–111)
Creatinine, Ser: 3.86 mg/dL — ABNORMAL HIGH (ref 0.44–1.00)
Creatinine, Ser: 3.13 mg/dL — ABNORMAL HIGH (ref 0.44–1.00)
GFR calc Af Amer: 13 mL/min — ABNORMAL LOW (ref >60)
GFR calc Af Amer: 17 mL/min — ABNORMAL LOW (ref >60)
GFR calc non Af Amer: 12 mL/min — ABNORMAL LOW (ref >60)
GFR calc non Af Amer: 15 mL/min — ABNORMAL LOW (ref >60)
Glucose, Bld: 185 mg/dL — ABNORMAL HIGH (ref 70–99)
Glucose, Bld: 178 mg/dL — ABNORMAL HIGH (ref 70–99)
Phosphorus: 5.3 mg/dL — ABNORMAL HIGH (ref 2.5–4.6)
Phosphorus: 4.8 mg/dL — ABNORMAL HIGH (ref 2.5–4.6)
Potassium: 6.8 mmol/L (ref 3.5–5.1)
Potassium: 6.2 mmol/L — ABNORMAL HIGH (ref 3.5–5.1)
Sodium: 126 mmol/L — ABNORMAL LOW (ref 135–145)
Sodium: 125 mmol/L — ABNORMAL LOW (ref 135–145)

## 2018-10-24 LAB — POCT ACTIVATED CLOTTING TIME
Activated Clotting Time: 180 seconds
Activated Clotting Time: 158 seconds
Activated Clotting Time: 164 seconds
Activated Clotting Time: 164 seconds
Activated Clotting Time: 147 seconds
Activated Clotting Time: 186 seconds
Activated Clotting Time: 186 seconds
Activated Clotting Time: 191 seconds
Activated Clotting Time: 197 seconds
Activated Clotting Time: 197 seconds

## 2018-10-24 LAB — CBG MONITORING, ED
Glucose-Capillary: 55 mg/dL — ABNORMAL LOW (ref 70–99)
Glucose-Capillary: 135 mg/dL — ABNORMAL HIGH (ref 70–99)

## 2018-10-24 LAB — PROTIME-INR
INR: 1.4 — ABNORMAL HIGH (ref 0.8–1.2)
Prothrombin Time: 16.8 seconds — ABNORMAL HIGH (ref 11.4–15.2)

## 2018-10-24 LAB — LACTIC ACID, PLASMA: Lactic Acid, Venous: 3.6 mmol/L (ref 0.5–1.9)

## 2018-10-24 LAB — MRSA PCR SCREENING: MRSA by PCR: POSITIVE — AB

## 2018-10-24 LAB — SARS CORONAVIRUS 2 BY RT PCR (HOSPITAL ORDER, PERFORMED IN ~~LOC~~ HOSPITAL LAB): SARS Coronavirus 2: NEGATIVE

## 2018-10-24 MED ORDER — SODIUM CHLORIDE 0.9 % IV SOLN
INTRAVENOUS | Status: AC | PRN
  Administered 2018-10-23: 500 mL via INTRAVENOUS

## 2018-10-24 MED ORDER — FENTANYL CITRATE (PF) 100 MCG/2ML IJ SOLN
25.0000 ug | Freq: Once | INTRAMUSCULAR | Status: AC
  Administered 2018-10-24: 25 ug via INTRAVENOUS

## 2018-10-24 MED ORDER — NOREPINEPHRINE 4 MG/250ML-% IV SOLN: 0.0000 ug/min | INTRAVENOUS | Status: DC

## 2018-10-24 MED ORDER — MIDAZOLAM HCL 2 MG/2ML IJ SOLN
1.0000 mg | INTRAMUSCULAR | Status: AC | PRN
  Administered 2018-10-24 (×3): 1 mg via INTRAVENOUS
  Filled 2018-10-24: qty 2

## 2018-10-24 MED ORDER — ONDANSETRON HCL 4 MG/2ML IJ SOLN: 4.0000 mg | Freq: Four times a day (QID) | INTRAMUSCULAR | Status: DC | PRN

## 2018-10-24 MED ORDER — SODIUM CHLORIDE 0.9 % IV SOLN
2.0000 g | INTRAVENOUS | Status: DC
  Filled 2018-10-24: qty 2

## 2018-10-24 MED ORDER — MIDAZOLAM HCL 2 MG/2ML IJ SOLN
1.0000 mg | INTRAMUSCULAR | Status: DC | PRN
  Administered 2018-10-24: 1 mg via INTRAVENOUS
  Filled 2018-10-24 (×2): qty 2

## 2018-10-24 MED ORDER — PANTOPRAZOLE SODIUM 40 MG IV SOLR
40.0000 mg | Freq: Every day | INTRAVENOUS | Status: DC
  Administered 2018-10-24 (×2): 40 mg via INTRAVENOUS
  Filled 2018-10-24 (×2): qty 40

## 2018-10-24 MED ORDER — NOREPINEPHRINE 16 MG/250ML-% IV SOLN
0.0000 ug/min | INTRAVENOUS | Status: DC
  Administered 2018-10-24: 2 ug/min via INTRAVENOUS
  Filled 2018-10-24: qty 250

## 2018-10-24 MED ORDER — SODIUM CHLORIDE 0.9 % IV SOLN
250.0000 [IU]/h | INTRAVENOUS | Status: DC
  Administered 2018-10-24: 250 [IU]/h via INTRAVENOUS_CENTRAL
  Administered 2018-10-25: 700 [IU]/h via INTRAVENOUS_CENTRAL
  Filled 2018-10-24 (×4): qty 2

## 2018-10-24 MED ORDER — ETOMIDATE 2 MG/ML IV SOLN
INTRAVENOUS | Status: AC
  Filled 2018-10-24: qty 10

## 2018-10-24 MED ORDER — DOPAMINE-DEXTROSE 3.2-5 MG/ML-% IV SOLN
0.0000 ug/kg/min | INTRAVENOUS | Status: DC
  Administered 2018-10-24 – 2018-10-25 (×8): 20 ug/kg/min via INTRAVENOUS
  Filled 2018-10-24 (×7): qty 250

## 2018-10-24 MED ORDER — HEPARIN SODIUM (PORCINE) 5000 UNIT/ML IJ SOLN
5000.0000 [IU] | Freq: Three times a day (TID) | INTRAMUSCULAR | Status: DC
  Administered 2018-10-24 – 2018-10-25 (×4): 5000 [IU] via SUBCUTANEOUS
  Filled 2018-10-24 (×4): qty 1

## 2018-10-24 MED ORDER — MIDAZOLAM 50MG/50ML (1MG/ML) PREMIX INFUSION
0.5000 mg/h | INTRAVENOUS | Status: DC
  Administered 2018-10-24: 0.5 mg/h via INTRAVENOUS
  Administered 2018-10-25: 2 mg/h via INTRAVENOUS
  Filled 2018-10-24 (×2): qty 50

## 2018-10-24 MED ORDER — ETOMIDATE 2 MG/ML IV SOLN
20.0000 mg | Freq: Once | INTRAVENOUS | Status: AC
  Administered 2018-10-24: 20 mg via INTRAVENOUS

## 2018-10-24 MED ORDER — NOREPINEPHRINE 4 MG/250ML-% IV SOLN
INTRAVENOUS | Status: AC
  Administered 2018-10-24: 4 mg
  Filled 2018-10-24: qty 250

## 2018-10-24 MED ORDER — FENTANYL BOLUS VIA INFUSION
25.0000 ug | INTRAVENOUS | Status: DC | PRN
  Filled 2018-10-24: qty 25

## 2018-10-24 MED ORDER — SODIUM CHLORIDE 0.9 % IV SOLN
1.2500 ng/kg/min | INTRAVENOUS | Status: DC
  Administered 2018-10-24: 40 ng/kg/min via INTRAVENOUS
  Administered 2018-10-24: 5 ng/kg/min via INTRAVENOUS
  Administered 2018-10-25: 40 ng/kg/min via INTRAVENOUS
  Filled 2018-10-24 (×4): qty 1

## 2018-10-24 MED ORDER — CLINDAMYCIN PHOSPHATE 600 MG/50ML IV SOLN
600.0000 mg | Freq: Three times a day (TID) | INTRAVENOUS | Status: DC
  Administered 2018-10-24 – 2018-10-25 (×4): 600 mg via INTRAVENOUS
  Filled 2018-10-24 (×5): qty 50

## 2018-10-24 MED ORDER — IPRATROPIUM-ALBUTEROL 0.5-2.5 (3) MG/3ML IN SOLN
3.0000 mL | Freq: Four times a day (QID) | RESPIRATORY_TRACT | Status: DC
  Administered 2018-10-24 – 2018-10-25 (×6): 3 mL via RESPIRATORY_TRACT
  Filled 2018-10-24 (×6): qty 3

## 2018-10-24 MED ORDER — VASOPRESSIN 20 UNIT/ML IV SOLN
0.0300 [IU]/min | INTRAVENOUS | Status: DC
  Administered 2018-10-25: 0.03 [IU]/min via INTRAVENOUS
  Filled 2018-10-24 (×2): qty 2

## 2018-10-24 MED ORDER — SODIUM CHLORIDE 0.9 % IV SOLN
250.0000 mL | INTRAVENOUS | Status: DC
  Administered 2018-10-24: 250 mL via INTRAVENOUS

## 2018-10-24 MED ORDER — VANCOMYCIN HCL 10 G IV SOLR
1500.0000 mg | INTRAVENOUS | Status: DC
  Filled 2018-10-24: qty 1500

## 2018-10-24 MED ORDER — ORAL CARE MOUTH RINSE
15.0000 mL | OROMUCOSAL | Status: DC
  Administered 2018-10-24 – 2018-10-25 (×10): 15 mL via OROMUCOSAL

## 2018-10-24 MED ORDER — FENTANYL CITRATE (PF) 100 MCG/2ML IJ SOLN
50.0000 ug | Freq: Once | INTRAMUSCULAR | Status: AC
  Administered 2018-10-24: 50 ug via INTRAVENOUS

## 2018-10-24 MED ORDER — LACTATED RINGERS IV BOLUS
1000.0000 mL | Freq: Once | INTRAVENOUS | Status: AC
  Administered 2018-10-24: 1000 mL via INTRAVENOUS

## 2018-10-24 MED ORDER — SODIUM CHLORIDE 0.9 % IV SOLN
2.0000 g | Freq: Two times a day (BID) | INTRAVENOUS | Status: DC
  Administered 2018-10-24 (×2): 2 g via INTRAVENOUS
  Filled 2018-10-24 (×4): qty 2

## 2018-10-24 MED ORDER — VANCOMYCIN HCL 10 G IV SOLR: 1500.0000 mg | INTRAVENOUS | Status: DC

## 2018-10-24 MED ORDER — FENTANYL CITRATE (PF) 100 MCG/2ML IJ SOLN
INTRAMUSCULAR | Status: AC
  Filled 2018-10-24: qty 2

## 2018-10-24 MED ORDER — CHLORHEXIDINE GLUCONATE 0.12% ORAL RINSE (MEDLINE KIT)
15.0000 mL | Freq: Two times a day (BID) | OROMUCOSAL | Status: DC
  Administered 2018-10-24 (×2): 15 mL via OROMUCOSAL

## 2018-10-24 MED ORDER — EPINEPHRINE PF 1 MG/ML IJ SOLN
0.5000 ug/min | INTRAVENOUS | Status: DC
  Administered 2018-10-24 (×2): 20 ug/min via INTRAVENOUS
  Administered 2018-10-24: 14 ug/min via INTRAVENOUS
  Administered 2018-10-24: 20 ug/min via INTRAVENOUS
  Administered 2018-10-24: 10 ug/min via INTRAVENOUS
  Administered 2018-10-24 – 2018-10-25 (×3): 20 ug/min via INTRAVENOUS
  Filled 2018-10-24 (×12): qty 4

## 2018-10-24 MED ORDER — DEXTROSE 50 % IV SOLN
INTRAVENOUS | Status: AC
  Administered 2018-10-24: 50 mL
  Filled 2018-10-24: qty 50

## 2018-10-24 MED ORDER — HEPARIN BOLUS VIA INFUSION (CRRT)
1000.0000 [IU] | INTRAVENOUS | Status: DC | PRN
  Filled 2018-10-24: qty 1000

## 2018-10-24 MED ORDER — SODIUM CHLORIDE 0.9 % IV SOLN: INTRAVENOUS | Status: DC | PRN

## 2018-10-24 MED ORDER — DOPAMINE-DEXTROSE 3.2-5 MG/ML-% IV SOLN
INTRAVENOUS | Status: AC | PRN
  Administered 2018-10-23: 5 ug/kg/min via INTRAVENOUS

## 2018-10-24 MED ORDER — METRONIDAZOLE IN NACL 5-0.79 MG/ML-% IV SOLN
500.0000 mg | Freq: Three times a day (TID) | INTRAVENOUS | Status: DC
  Administered 2018-10-24 – 2018-10-25 (×3): 500 mg via INTRAVENOUS
  Filled 2018-10-24 (×3): qty 100

## 2018-10-24 MED ORDER — CALCIUM CHLORIDE 10 % IV SOLN
INTRAVENOUS | Status: AC | PRN
  Administered 2018-10-23: 1 g via INTRAVENOUS

## 2018-10-24 MED ORDER — ATROPINE SULFATE 1 MG/ML IJ SOLN
INTRAMUSCULAR | Status: AC | PRN
  Administered 2018-10-23: 1 mg via INTRAVENOUS

## 2018-10-24 MED ORDER — FENTANYL CITRATE (PF) 100 MCG/2ML IJ SOLN
25.0000 ug | INTRAMUSCULAR | Status: DC | PRN
  Administered 2018-10-24 (×2): 50 ug via INTRAVENOUS
  Filled 2018-10-24: qty 2

## 2018-10-24 MED ORDER — FENTANYL CITRATE (PF) 100 MCG/2ML IJ SOLN
25.0000 ug | INTRAMUSCULAR | Status: AC | PRN
  Administered 2018-10-24 (×3): 25 ug via INTRAVENOUS
  Filled 2018-10-24: qty 2

## 2018-10-24 MED ORDER — SODIUM CHLORIDE 0.9 % IV SOLN
2.0000 g | Freq: Two times a day (BID) | INTRAVENOUS | Status: DC
  Filled 2018-10-24: qty 2

## 2018-10-24 MED ORDER — FENTANYL 2500MCG IN NS 250ML (10MCG/ML) PREMIX INFUSION
25.0000 ug/h | INTRAVENOUS | Status: DC
  Administered 2018-10-24: 25 ug/h via INTRAVENOUS
  Filled 2018-10-24: qty 250

## 2018-10-24 MED FILL — Medication: Qty: 1 | Status: AC

## 2018-10-24 NOTE — Procedures (Signed)
Central Venous Catheter Insertion Procedure Note Donni Borde [id number] May 03, 1951  Procedure: Insertion of Central Venous Catheter Indications: dialysis   Procedure Details Consent: Risks of procedure as well as the alternatives and risks of each were explained to the (patient/caregiver).  Consent for procedure obtained. Time Out: Verified patient identification, verified procedure, site/side was marked, verified correct patient position, special equipment/implants available, medications/allergies/relevent history reviewed, required imaging and test results available.  Performed  Maximum sterile technique was used including antiseptics, cap, gloves, gown, hand hygiene, mask, sheet and real time [place] used to ID and cannulate vessel . Skin prep: Chlorhexidine; local anesthetic administered A antimicrobial bonded/coated triple lumen catheter was placed in the right internal jugular vein using the Seldinger technique.  Evaluation Blood flow good Complications: No apparent complications Patient did tolerate procedure well. Chest X-ray ordered to verify placement.  CXR: pending.  Shelby Mattocks 10/24/2018, 5:07 PM  Simonne Martinet ACNP-BC [facility] Pulmonary/Critical Care Pager # [phone] OR # [phone] if no answer

## 2018-10-24 NOTE — ED Notes (Signed)
ED TO INPATIENT HANDOFF REPORT  ED Nurse Name and Phone #:  [phone] Rachael Robertson  S Name/Age/Gender Rachael Robertson 68 y.o. female Room/Bed: 030C/030C  Code Status   Code Status: Full Code  Home/SNF/Other Home Patient oriented to: self, place, time and situation Is this baseline? Yes   Triage Complete: Triage complete  Chief Complaint Bariatric Pt W/ Wounds  Triage Note Per EMS, was called by patients home health due to unstageable wound on patients right hip that has taken on a new appearance. A part of the wound now has bulged out. Also c/o SOB for the past 3 days.   Allergies Allergies  Allergen Reactions  . Other Anaphylaxis    Melons  . Fish Allergy Diarrhea  . Fruit & Vegetable Daily [Nutritional Supplements] Diarrhea    Have to be cooked or vine-ripened  . Lactose Intolerance (Gi) Diarrhea  . Naproxen Other (See Comments)    Makes her "congested"  . Sulfa Antibiotics Hives  . Erythromycin Hives and Rash  . Latex Rash  . Mupirocin Rash  . Tape Rash and Other (See Comments)    Plastic tape (alo) BURNS, but Tegaderm is tolerated    Level of Care/Admitting Diagnosis ED Disposition    ED Disposition Condition Comment   Admit  [facility] [[id number]]  Level of Care: ICU [6]  Covid Evaluation: N/A  Diagnosis: Acute kidney injury (AKI) with acute tubular necrosis (ATN) (HCC) [[id number]]  Admitting Physician: Carin HockSCATLIFFE, KRISTEN D [[id number]]  Attending Physician: Carin HockSCATLIFFE, KRISTEN D [[id number]]  Estimated length of stay: > 1 week  Certification:: I certify this patient will need inpatient services for at least 2 midnights  PT Class (Do Not Modify): Inpatient [101]  PT Acc Code (Do Not Modify): Private [1]       B Medical/Surgery History Past Medical History:  Diagnosis Date  . Arthritis   . Asthma   . CHF (congestive heart failure) (HCC)   . COPD (chronic obstructive pulmonary disease) (HCC)   . Obesity   . Opiate use   . Renal  disorder   . Tracheostomy in place [facility](HCC)    Past Surgical History:  Procedure Laterality Date  . HEMATOMA EVACUATION Right 07/29/2018   Procedure: EVACUATION HEMATOMA OF RIGHT LEG WITH  ACELL AND VAC PLACEMENT;  Surgeon: Peggye Formillingham, Claire S, DO;  Location: [facility] OR;  Service: Plastics;  Laterality: Right;  . Perianal abscess    . TRACHELECTOMY       A IV Location/Drains/Wounds Patient Lines/Drains/Airways Status   Active Line/Drains/Airways    Name:   Placement date:   Placement time:   Site:   Days:   Peripheral IV 10/20/2018 Right Antecubital   10/11/2018    2210    Antecubital   1   Peripheral IV 10/17/2018 Right Other (Comment)   11/06/2018    2230    Other (Comment)   1   Peripheral IV 10/08/2018 Right Other (Comment)   10/29/2018    2250    Other (Comment)   1   Negative Pressure Wound Therapy Leg Anterior;Right   07/29/18    1437    -   87   External Urinary Catheter   08/25/18    0942    -   60   Incision (Closed) 07/29/18 Leg Right   07/29/18    1517     87   Tracheostomy Shiley 6 mm Uncuffed;Distal   08/18/18    0150    6 mm  67   Wound / Incision (Open or Dehisced) 07/20/18 Other (Comment) Knee Right RIGHT KNEE HEMATOMA; BLEEDING AND BRUISED   07/20/18    1800    Knee   96          Intake/Output Last 24 hours No intake or output data in the 24 hours ending 10/24/18 0118  Labs/Imaging Results for orders placed or performed during the hospital encounter of 22-Nov-2018 (from the past 48 hour(s))  Comprehensive metabolic panel     Status: Abnormal   Collection Time: November 22, 2018 10:24 PM  Result Value Ref Range   Sodium 126 (L) 135 - 145 mmol/L   Potassium >7.5 (HH) 3.5 - 5.1 mmol/L    Comment: NO VISIBLE HEMOLYSIS CRITICAL RESULT CALLED TO, READ BACK BY AND VERIFIED WITH: Anissia Wessells,M RN 10/24/2018 0001 JORDANS    Chloride 92 (L) 98 - 111 mmol/L   CO2 24 22 - 32 mmol/L   Glucose, Bld 77 70 - 99 mg/dL   BUN 734 (H) 8 - 23 mg/dL   Creatinine, Ser 2.87 (H) 0.44 - 1.00 mg/dL   Calcium 8.8  (L) 8.9 - 10.3 mg/dL   Total Protein 6.9 6.5 - 8.1 g/dL   Albumin 1.3 (L) 3.5 - 5.0 g/dL   AST 22 15 - 41 U/L   ALT 19 0 - 44 U/L   Alkaline Phosphatase 298 (H) 38 - 126 U/L   Total Bilirubin 0.7 0.3 - 1.2 mg/dL   GFR calc non Af Amer 11 (L) >60 mL/min   GFR calc Af Amer 13 (L) >60 mL/min   Anion gap 10 5 - 15    Comment: Performed at [facility], [address], [place], [place] [address]  CBC WITH DIFFERENTIAL     Status: Abnormal   Collection Time: 2018-11-22 10:24 PM  Result Value Ref Range   WBC 19.4 (H) 4.0 - 10.5 K/uL   RBC 3.96 3.87 - 5.11 MIL/uL   Hemoglobin 10.1 (L) 12.0 - 15.0 g/dL   HCT 72.6 (L) 20.3 - 55.9 %   MCV 85.9 80.0 - 100.0 fL   MCH 25.5 (L) 26.0 - 34.0 pg   MCHC 29.7 (L) 30.0 - 36.0 g/dL   RDW 74.1 (H) 63.8 - 45.3 %   Platelets 337 150 - 400 K/uL   nRBC 1.2 (H) 0.0 - 0.2 %   Neutrophils Relative % 78 %   Neutro Abs 15.3 (H) 1.7 - 7.7 K/uL   Lymphocytes Relative 6 %   Lymphs Abs 1.1 0.7 - 4.0 K/uL   Monocytes Relative 2 %   Monocytes Absolute 0.3 0.1 - 1.0 K/uL   Eosinophils Relative 0 %   Eosinophils Absolute 0.0 0.0 - 0.5 K/uL   Basophils Relative 1 %   Basophils Absolute 0.1 0.0 - 0.1 K/uL   Immature Granulocytes 13 %   Abs Immature Granulocytes 2.45 (H) 0.00 - 0.07 K/uL   Smudge Cells PRESENT     Comment: Performed at [facility], [address] [place], [place] [address]  Blood Culture (routine x 2)     Status: None (Preliminary result)   Collection Time: Nov 22, 2018 10:24 PM  Result Value Ref Range   Specimen Description BLOOD LEFT ANTECUBITAL    Special Requests      BOTTLES DRAWN AEROBIC AND ANAEROBIC Blood Culture results may not be optimal due to an inadequate volume of blood received in culture bottles Performed at [facility], [address], [place], [place] [address]  Culture PENDING    Report Status PENDING   I-stat chem 8, ED ([facility] and [facility] only)     Status: Abnormal   Collection Time: 08-Nov-2018 10:31 PM  Result  Value Ref Range   Sodium 123 (L) 135 - 145 mmol/L   Potassium 7.5 (HH) 3.5 - 5.1 mmol/L   Chloride 95 (L) 98 - 111 mmol/L   BUN 124 (H) 8 - 23 mg/dL   Creatinine, Ser 1.61 (H) 0.44 - 1.00 mg/dL   Glucose, Bld 74 70 - 99 mg/dL   Calcium, Ion 0.96 (L) 1.15 - 1.40 mmol/L   TCO2 25 22 - 32 mmol/L   Hemoglobin 11.9 (L) 12.0 - 15.0 g/dL   HCT 04.5 (L) 40.9 - 81.1 %   Comment NOTIFIED PHYSICIAN   CBG monitoring, ED     Status: None   Collection Time: 11/08/18 10:37 PM  Result Value Ref Range   Glucose-Capillary 79 70 - 99 mg/dL  CBG monitoring, ED     Status: Abnormal   Collection Time: 11/08/2018 10:51 PM  Result Value Ref Range   Glucose-Capillary 152 (H) 70 - 99 mg/dL  Lactic acid, plasma     Status: None   Collection Time: 11-08-18 10:59 PM  Result Value Ref Range   Lactic Acid, Venous 1.7 0.5 - 1.9 mmol/L    Comment: Performed at [facility], [address], [place], [place] [address]  SARS Coronavirus 2 (CEPHEID - Performed in [facility] hospital lab), Hosp Order     Status: None   Collection Time: 11-08-18 10:59 PM  Result Value Ref Range   SARS Coronavirus 2 NEGATIVE NEGATIVE    Comment: (NOTE) If result is NEGATIVE SARS-CoV-2 target nucleic acids are NOT DETECTED. The SARS-CoV-2 RNA is generally detectable in upper and lower  respiratory specimens during the acute phase of infection. The lowest  concentration of SARS-CoV-2 viral copies this assay can detect is 250  copies / mL. A negative result does not preclude SARS-CoV-2 infection  and should not be used as the sole basis for treatment or other  patient management decisions.  A negative result may occur with  improper specimen collection / handling, submission of specimen other  than nasopharyngeal swab, presence of viral mutation(s) within the  areas targeted by this assay, and inadequate number of viral copies  (<250 copies / mL). A negative result must be combined with clinical  observations, patient history,  and epidemiological information. If result is POSITIVE SARS-CoV-2 target nucleic acids are DETECTED. The SARS-CoV-2 RNA is generally detectable in upper and lower  respiratory specimens dur ing the acute phase of infection.  Positive  results are indicative of active infection with SARS-CoV-2.  Clinical  correlation with patient history and other diagnostic information is  necessary to determine patient infection status.  Positive results do  not rule out bacterial infection or co-infection with other viruses. If result is PRESUMPTIVE POSTIVE SARS-CoV-2 nucleic acids MAY BE PRESENT.   A presumptive positive result was obtained on the submitted specimen  and confirmed on repeat testing.  While 2019 novel coronavirus  (SARS-CoV-2) nucleic acids may be present in the submitted sample  additional confirmatory testing may be necessary for epidemiological  and / or clinical management purposes  to differentiate between  SARS-CoV-2 and other Sarbecovirus currently known to infect humans.  If clinically indicated additional testing with an alternate test  methodology [phone]) is advised. The SARS-CoV-2 RNA is generally  detectable in upper and lower respiratory sp  ecimens during the acute  phase of infection. The expected result is Negative. Fact Sheet for Patients:  BoilerBrush.com.cy Fact Sheet for Healthcare Providers: https://pope.com/ This test is not yet approved or cleared by the [place] FDA and has been authorized for detection and/or diagnosis of SARS-CoV-2 by FDA under an Emergency Use Authorization (EUA).  This EUA will remain in effect (meaning this test can be used) for the duration of the COVID-19 declaration under Section 564(b)(1) of the Act, 21 U.S.C. section 360bbb-3(b)(1), unless the authorization is terminated or revoked sooner. Performed at [facility], [address], [place], [place] [address]   Blood  Culture (routine x 2)     Status: None (Preliminary result)   Collection Time: 2018-11-09 11:04 PM  Result Value Ref Range   Specimen Description BLOOD    Special Requests      RUQ BOTTLES DRAWN AEROBIC ONLY Blood Culture adequate volume Performed at [facility], [address] [place], [place] [address]    Culture PENDING    Report Status PENDING   I-stat chem 8, ED [facility] only)     Status: Abnormal   Collection Time: 11-09-18 11:12 PM  Result Value Ref Range   Sodium 123 (L) 135 - 145 mmol/L   Potassium 7.2 (HH) 3.5 - 5.1 mmol/L   Chloride 96 (L) 98 - 111 mmol/L   BUN 126 (H) 8 - 23 mg/dL   Creatinine, Ser 0.98 (H) 0.44 - 1.00 mg/dL   Glucose, Bld 119 (H) 70 - 99 mg/dL   Calcium, Ion 1.47 (L) 1.15 - 1.40 mmol/L   TCO2 23 22 - 32 mmol/L   Hemoglobin 11.6 (L) 12.0 - 15.0 g/dL   HCT 82.9 (L) 56.2 - 13.0 %   Comment NOTIFIED PHYSICIAN    Dg Chest Portable 1 View  Result Date: 10/24/2018 CLINICAL DATA:  Central line placement EXAM: PORTABLE CHEST 1 VIEW COMPARISON:  2018-11-09 FINDINGS: Left IJ approach central venous catheter tip in the proximal right atrium. Examination otherwise unchanged. IMPRESSION: L IJ CVC tip in the proximal right atrium. Electronically Signed   By: Deatra Robinson M.D.   On: 10/24/2018 01:04   Dg Chest Port 1 View  Result Date: 11/09/18 CLINICAL DATA:  Sepsis EXAM: PORTABLE CHEST 1 VIEW COMPARISON:  08/21/2018 FINDINGS: Tracheostomy tube tip projects over the upper trachea. Mild cardiomegaly. No focal airspace consolidation or pulmonary edema. IMPRESSION: No active disease. Electronically Signed   By: Deatra Robinson M.D.   On: 2018/11/09 22:30    Pending Labs Unresulted Labs (From admission, onward)    Start     Ordered   10/24/18 1600  Renal function panel (daily at 1600)  Daily at 1600,   R     November 09, 2018 2334   10/24/18 0500  Renal function panel (daily at 0500)  Daily,   R     11/09/18 2334   10/24/18 0500  Magnesium  Daily,   R     09-Nov-2018  2334   2018/11/09 2202  Lactic acid, plasma  Now then every 2 hours,   STAT     2018/11/09 2205   11-09-2018 2202  Urinalysis, Routine w reflex microscopic  ONCE - STAT,   STAT     Nov 09, 2018 2205          Vitals/Pain Today's Vitals   10/24/18 0028 10/24/18 0030 10/24/18 0045 10/24/18 0100  BP:   (!) 90/47 97/79  Pulse: 71 97  82  Resp: Marland Kitchen)  23 (!) 21 (!) 24 (!) 22  Temp:      TempSrc:      SpO2: 98% 97%  96%  Weight:      PainSc:        Isolation Precautions No active isolations  Medications Medications  ceFEPIme (MAXIPIME) 2 g in sodium chloride 0.9 % 100 mL IVPB (0 g Intravenous Hold 10/24/18 0050)  metroNIDAZOLE (FLAGYL) IVPB 500 mg (has no administration in time range)  sodium bicarbonate injection 50 mEq (50 mEq Intravenous Not Given 10/24/18 0057)  vancomycin (VANCOCIN) 2,500 mg in sodium chloride 0.9 % 500 mL IVPB (2,500 mg Intravenous Not Given 10/24/18 0052)  sodium zirconium cyclosilicate (LOKELMA) packet 10 g (10 g Oral Not Given Nov 01, 2018 2323)  heparin injection 1,000-6,000 Units (has no administration in time range)  prismasol BGK 2/2.5 replacement solution (has no administration in time range)  prismasol BGK 2/2.5 replacement solution (has no administration in time range)  prismasol BGK 0/2.5 infusion (has no administration in time range)  heparin injection 1,000 Units (has no administration in time range)  ceFEPIme (MAXIPIME) 2 g in sodium chloride 0.9 % 100 mL IVPB (has no administration in time range)  clindamycin (CLEOCIN) IVPB 600 mg (has no administration in time range)  heparin injection 5,000 Units (has no administration in time range)  pantoprazole (PROTONIX) injection 40 mg (has no administration in time range)  ondansetron (ZOFRAN) injection 4 mg (has no administration in time range)  0.9 %  sodium chloride infusion (has no administration in time range)  DOPamine (INTROPIN) 800 mg in dextrose 5 % 250 mL (3.2 mg/mL) infusion (has no administration in time  range)  ipratropium-albuterol (DUONEB) 0.5-2.5 (3) MG/3ML nebulizer solution 3 mL (has no administration in time range)  metroNIDAZOLE (FLAGYL) IVPB 500 mg (has no administration in time range)  sodium chloride 0.9 % bolus 3,000 mL (3,000 mLs Intravenous New Bag/Given Nov 01, 2018 2253)  fentaNYL (SUBLIMAZE) 100 MCG/2ML injection (50 mcg  Given 11-01-2018 2308)  insulin aspart (novoLOG) injection 10 Units (10 Units Intravenous Given Nov 01, 2018 2040)  dextrose 50 % solution 50 mL (50 mLs Intravenous Given 2018-11-01 2039)  atropine injection (1 mg Intravenous Given 11-01-18 2225)  calcium chloride injection (1 g Intravenous Given 11-01-2018 2226)  0.9 %  sodium chloride infusion (500 mLs Intravenous New Bag/Given 11-01-18 2232)  DOPamine (INTROPIN) 800 mg in dextrose 5 % 250 mL (3.2 mg/mL) infusion (5 mcg/kg/min  183.3 kg Intravenous New Bag/Given Nov 01, 2018 2238)    Mobility non-ambulatory Low fall risk   Focused Assessments Cardiac Assessment Handoff:  Cardiac Rhythm: Heart block No results found for: CKTOTAL, CKMB, CKMBINDEX, TROPONINI No results found for: DDIMER Does the Patient currently have chest pain? Yes      R Recommendations: See Admitting Provider Note  Report given to:   Additional Notes:  Need case management consult

## 2018-10-24 NOTE — Progress Notes (Signed)
Patient ID: Rachael KluverKristy Robertson, female   DOB: 03-26-1951, 68 y.o.   MRN: [id number]       Subjective: Patient is awake on vent.  She is on 4 pressors and CRRT.  She is in completely heart block and is being externally paced right now.  She has a chronic trach due to chronic respiratory failure.  Objective: Vital signs in last 24 hours: Temp:  [96.1 F (35.6 C)-98.4 F (36.9 C)] 98.4 F (36.9 C) (05/17 0400) Pulse Rate:  [34-139] 79 (05/17 0645) Resp:  [0-55] 24 (05/17 0715) BP: (66-192)/(23-130) 98/44 (05/17 0500) SpO2:  [92 %-100 %] 100 % (05/17 0645) Arterial Line BP: (86-122)/(48-70) 93/62 (05/17 0715) FiO2 (%):  [40 %-100 %] 100 % (05/17 0345) Weight:  [183.3 kg] 183.3 kg (05/16 2009)    Intake/Output from previous day: 05/16 0701 - 05/17 0700 In: 1067.2 [I.V.:715.6; IV Piggyback:351.7] Out: -  Intake/Output this shift: No intake/output data recorded.  PE: Abd: extensive necrosis of her right lateral abdominal wall with ischemic changes to some of her skin surrounding all the frankly necrotic areas.  See picture below.   Ext: see pictures in chart for necrotic areas on both of her LEs as well   Lab Results:  Recent Labs    June 17, 2018 2224  10/24/18 0445 10/24/18 0603  WBC 19.4*  --   --   --   HGB 10.1*   < > 12.6 13.6  HCT 34.0*   < > 37.0 40.0  PLT 337  --   --   --    < > = values in this interval not displayed.   BMET Recent Labs    June 17, 2018 2224 June 17, 2018 2231 June 17, 2018 2312 10/24/18 0445 10/24/18 0603  NA 126* 123* 123* 123* 125*  K >7.5* 7.5* 7.2* 7.4* 7.3*  CL 92* 95* 96*  --   --   CO2 24  --   --   --   --   GLUCOSE 77 74 131*  --   --   BUN 121* 124* 126*  --   --   CREATININE 3.99* 4.60* 4.60*  --   --   CALCIUM 8.8*  --   --   --   --    PT/INR No results for input(s): LABPROT, INR in the last 72 hours. CMP     Component Value Date/Time   NA 125 (L) 10/24/2018 0603   NA 142 05/20/2018 1446   K 7.3 (HH) 10/24/2018 0603   CL 96 (L)  January 09, 202020 2312   CO2 24 January 09, 202020 2224   GLUCOSE 131 (H) January 09, 202020 2312   BUN 126 (H) January 09, 202020 2312   BUN 39 (H) 05/20/2018 1446   CREATININE 4.60 (H) January 09, 202020 2312   CALCIUM 8.8 (L) January 09, 202020 2224   PROT 6.9 January 09, 202020 2224   ALBUMIN 1.3 (L) January 09, 202020 2224   AST 22 January 09, 202020 2224   ALT 19 January 09, 202020 2224   ALKPHOS 298 (H) January 09, 202020 2224   BILITOT 0.7 January 09, 202020 2224   GFRNONAA 11 (L) January 09, 202020 2224   GFRAA 13 (L) January 09, 202020 2224   Lipase  No results found for: LIPASE     Studies/Results: Dg Chest Portable 1 View  Result Date: 10/24/2018 CLINICAL DATA:  Central line placement EXAM: PORTABLE CHEST 1 VIEW COMPARISON:  January 09, 202020 FINDINGS: Left IJ approach central venous catheter tip in the proximal right atrium. Examination otherwise unchanged. IMPRESSION: L IJ CVC tip in the proximal right atrium. Electronically Signed   By: Chrisandra NettersKevin  Herman M.D.  On: 10/24/2018 01:04   Dg Chest Port 1 View  Result Date: Oct 28, 2018 CLINICAL DATA:  Sepsis EXAM: PORTABLE CHEST 1 VIEW COMPARISON:  08/21/2018 FINDINGS: Tracheostomy tube tip projects over the upper trachea. Mild cardiomegaly. No focal airspace consolidation or pulmonary edema. IMPRESSION: No active disease. Electronically Signed   By: Deatra Robinson M.D.   On: 10-28-2018 22:30    Anti-infectives: Anti-infectives (From admission, onward)   Start     Dose/Rate Route Frequency Ordered Stop   10/14/2018 0000  vancomycin (VANCOCIN) 1,500 mg in sodium chloride 0.9 % 500 mL IVPB     1,500 mg 250 mL/hr over 120 Minutes Intravenous Every 24 hours 10/24/18 0809     10/24/18 2200  ceFEPIme (MAXIPIME) 2 g in sodium chloride 0.9 % 100 mL IVPB  Status:  Discontinued     2 g 200 mL/hr over 30 Minutes Intravenous Every 24 hours 10/28/18 2241 10-28-18 2358   10/24/18 1800  ceFEPIme (MAXIPIME) 2 g in sodium chloride 0.9 % 100 mL IVPB  Status:  Discontinued     2 g 200 mL/hr over 30 Minutes Intravenous Every 12 hours 10/24/18 0642 10/24/18  0809   10/24/18 1200  ceFEPIme (MAXIPIME) 2 g in sodium chloride 0.9 % 100 mL IVPB  Status:  Discontinued     2 g 200 mL/hr over 30 Minutes Intravenous Every 12 hours 2018-10-28 2358 10/24/18 0642   10/24/18 1000  ceFEPIme (MAXIPIME) 2 g in sodium chloride 0.9 % 100 mL IVPB     2 g 200 mL/hr over 30 Minutes Intravenous Every 24 hours 10/24/18 0809     10/24/18 0600  metroNIDAZOLE (FLAGYL) IVPB 500 mg     500 mg 100 mL/hr over 60 Minutes Intravenous Every 8 hours 10/24/18 0057     10/24/18 0100  clindamycin (CLEOCIN) IVPB 600 mg     600 mg 100 mL/hr over 30 Minutes Intravenous Every 8 hours 10/24/18 0047     Oct 28, 2018 2300  vancomycin (VANCOCIN) 2,500 mg in sodium chloride 0.9 % 500 mL IVPB     2,500 mg 250 mL/hr over 120 Minutes Intravenous  Once 10/28/2018 2238 10/24/18 0747   10-28-18 2215  ceFEPIme (MAXIPIME) 2 g in sodium chloride 0.9 % 100 mL IVPB     2 g 200 mL/hr over 30 Minutes Intravenous  Once 10/28/2018 2205     Oct 28, 2018 2215  metroNIDAZOLE (FLAGYL) IVPB 500 mg     500 mg 100 mL/hr over 60 Minutes Intravenous  Once Oct 28, 2018 2205     2018-10-28 2215  vancomycin (VANCOCIN) IVPB 1000 mg/200 mL premix  Status:  Discontinued     1,000 mg 200 mL/hr over 60 Minutes Intravenous  Once 10-28-18 2205 2018-10-28 2238       Assessment/Plan Chronic respiratory failure with a trach/COPD Complete heart block, externally paced ARF, on CRRT Hyperkalemia - K still 7.3 despite CCRT right now Septic shock - secondary to soft tissue infection, on 4 pressors H/O CHF Morbid obesity  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  Unfortunately, she is too unstable to go to the operating room at this time to have this addressed.  She K is still over 7 despite initiating CRRT.  She is in complete HB and being externally paced as well as  on at least 4 pressors to support her BP  that is still in the 90s.  She would need significant debridement which would be very difficult to survive given her pre-operative state.  I have discussed with Dr. Isaiah Serge the severity of this situation from a surgical standpoint.  He is going to contact her sister for further goals of care discussions.  FEN - NPO, on vent VTE - heparin ID - Maxipime, clindamycin, vancomycin   LOS: 0 days    Letha Cape , [facility] Surgery 10/24/2018, 8:10 AM Pager: [phone]

## 2018-10-24 NOTE — Procedures (Signed)
Arterial Catheter Insertion Procedure Note Rachael Robertson [id number] 05/11/1951  Procedure: Insertion of Arterial Catheter  Indications: Blood pressure monitoring  Procedure Details Consent: Unable to obtain consent because of emergent medical necessity. Time Out: Verified patient identification, verified procedure, site/side was marked, verified correct patient position, special equipment/implants available, medications/allergies/relevent history reviewed, required imaging and test results available.  Performed  Maximum sterile technique was used including antiseptics, cap, gloves, gown, hand hygiene and mask. Skin prep: Chlorhexidine; local anesthetic administered 20 gauge catheter was inserted into right radial artery using the Seldinger technique. ULTRASOUND GUIDANCE USED: NO Evaluation Blood flow good; BP tracing good. Complications: No apparent complications.   Benjamine Sprague, B.S, RRT, RCP 10/24/2018

## 2018-10-24 NOTE — Progress Notes (Addendum)
[facility] Progress Note   Subjective:   Coded 4am, circulation restored.  Remains paced.  Too unstable for CRRT until about 6am and now running while on 4 vasopressors.  As I was in room filter clotting.   Objective Vitals:   10/24/18 0630 10/24/18 0645 10/24/18 0700 10/24/18 0715  BP:      Pulse: 80 79    Resp: (!) 21 (!) 22 19 (!) 24  Temp:      TempSrc:      SpO2: 100% 100%    Weight:      Height:       Physical Exam General: morbid obese, lying flat Heart: externally paced in 80s Lungs: coarse, on vent via trach Abdomen: obese, R sided abdominal cellulitis and necrotic area Extremities: 1+ edema throughout Dialysis Access: temp cath c/d/i  Additional Objective Labs: Basic Metabolic Panel: Recent Labs  Lab 10/16/2018 2224 10/08/2018 2231 10/21/2018 2312 10/24/18 0445 10/24/18 0603  NA 126* 123* 123* 123* 125*  K >7.5* 7.5* 7.2* 7.4* 7.3*  CL 92* 95* 96*  --   --   CO2 24  --   --   --   --   GLUCOSE 77 74 131*  --   --   BUN 121* 124* 126*  --   --   CREATININE 3.99* 4.60* 4.60*  --   --   CALCIUM 8.8*  --   --   --   --    Liver Function Tests: Recent Labs  Lab 10/17/2018 2224  AST 22  ALT 19  ALKPHOS 298*  BILITOT 0.7  PROT 6.9  ALBUMIN 1.3*   No results for input(s): LIPASE, AMYLASE in the last 168 hours. CBC: Recent Labs  Lab 10/20/2018 2224  11/01/2018 2312 10/24/18 0445 10/24/18 0603  WBC 19.4*  --   --   --   --   NEUTROABS 15.3*  --   --   --   --   HGB 10.1*   < > 11.6* 12.6 13.6  HCT 34.0*   < > 34.0* 37.0 40.0  MCV 85.9  --   --   --   --   PLT 337  --   --   --   --    < > = values in this interval not displayed.   Blood Culture    Component Value Date/Time   SDES BLOOD 10/24/2018 2304   SPECREQUEST  11/04/2018 2304    RUQ BOTTLES DRAWN AEROBIC ONLY Blood Culture adequate volume Performed at [facility] [address], [place], [place] [address]    CULT PENDING 10/09/2018 2304   REPTSTATUS PENDING 11/02/2018  2304    Cardiac Enzymes: No results for input(s): CKTOTAL, CKMB, CKMBINDEX, TROPONINI in the last 168 hours. CBG: Recent Labs  Lab 10/26/2018 2237 10/24/2018 2251 10/24/18 0218 10/24/18 0232  GLUCAP 79 152* 55* 135*   Iron Studies: No results for input(s): IRON, TIBC, TRANSFERRIN, FERRITIN in the last 72 hours. '@[email or web address]' @ Studies/Results: Dg Chest Portable 1 View  Result Date: 10/24/2018 CLINICAL DATA:  Central line placement EXAM: PORTABLE CHEST 1 VIEW COMPARISON:  10/30/2018 FINDINGS: Left IJ approach central venous catheter tip in the proximal right atrium. Examination otherwise unchanged. IMPRESSION: L IJ CVC tip in the proximal right atrium. Electronically Signed   By: Ulyses Jarred M.D.   On: 10/24/2018 01:04   Dg Chest Port 1 View  Result Date: 10/11/2018 CLINICAL DATA:  Sepsis EXAM: PORTABLE CHEST 1 VIEW COMPARISON:  08/21/2018 FINDINGS: Tracheostomy tube tip projects over the upper trachea. Mild cardiomegaly. No focal airspace consolidation or pulmonary edema. IMPRESSION: No active disease. Electronically Signed   By: Ulyses Jarred M.D.   On: 10/22/2018 22:30   Medications: . sodium chloride 10 mL/hr at 10/24/18 0800  . sodium chloride    . angiotensin II (GIAPREZA) infusion 5 ng/kg/min (10/24/18 0800)  . ceFEPime (MAXIPIME) IV    . clindamycin (CLEOCIN) IV Stopped (10/24/18 0457)  . DOPamine 20 mcg/kg/min (10/24/18 0800)  . epinephrine 20 mcg/min (10/24/18 0800)  . metronidazole    . prismasol BGK 0/2.5 1,500 mL/hr at 10/24/18 0612  . prismasol BGK 2/2.5 replacement solution 400 mL/hr at 10/24/18 0609  . prismasol BGK 2/2.5 replacement solution 400 mL/hr at 10/24/18 0605  . [START ON November 21, 2018] vancomycin    . vasopressin (PITRESSIN) infusion - *FOR SHOCK*     . chlorhexidine gluconate (MEDLINE KIT)  15 mL Mouth Rinse BID  . fentaNYL      . heparin  1,000 Units Intravenous STAT  . heparin  5,000 Units Subcutaneous Q8H  . ipratropium-albuterol  3 mL Nebulization  Q6H  . mouth rinse  15 mL Mouth Rinse 10 times per day  . pantoprazole (PROTONIX) IV  40 mg Intravenous QHS  . sodium bicarbonate  50 mEq Intravenous Once  . sodium zirconium cyclosilicate  10 g Oral Once      Assessment/Plan: **Hyperkalemia:  Multifactorial etiology -AKI, sepsis, spironolactone, KCl on med list PTA.  Severe, pt in complete heart block requiring external pacing despite acute medical interventions to lower K.  CRRT unable to be initiated until this AM due to pt too unstable; filter now clotting.  RN returning blood and will resume with heparin titrated per protocol.  Follow serial electrolytes.     **AKI, severe:  Baseline renal function normal, pt presenting with what appears to be large abdominal wall wound and sepsis.  Likely ATN from underlying sepsis.  Supportive care to restore perfusion with IVF to euvolemia, antibiotics, source control.   **Shock, septic + cardiogenic:  Pt appears to have large right sided abdominal wound with cellulitis..  Broad spectrum antibiotics, surgery to debride once patient more stable.   Correcting underlying etiology for heart block (presumed due to K); cardiology has seen, TV pacer not option currently.   **Hyponatremia:  Serum sodium 123 which is likely multifactorial with contributions of hypovolemia, shock, possibly SIADH from pain.  Will improve with CRRT, just need to monitor so she doesn't correct too quickly.  Will follow serial labs and provide free water via D5W IV as needed.    We will continue aggressive measures at this time but her prognosis is quite grim at this point.  PCCM has attempted to contact family who are unreachable at this time.   Jannifer Hick MD 10/24/2018, 8:22 AM  [facility] Pager: [phone]

## 2018-10-24 NOTE — Significant Event (Addendum)
..  CODE BLUE NOTE  Patient Name: Rachael Robertson   MRN: [id number]   Date of Birth/ Sex: 17-Apr-1951 , female      Admission Date: 10/30/2018  Attending Provider: Carin Hock, MD  Primary Diagnosis: Acute Renal Failure and Complete Heart Block    Indication: Pt transported to 2H from ED. Transported on Cardiac monitor HR in 110  HR dropped per RN>>>lowest 20 s complete Heart Block Code blue was subsequently called.  At the time of arrival on scene, ACLS protocol was underway. Code team at     Technical Description:  - CPR performance duration:  8 mins  - Was defibrillation or cardioversion used? No   - Was external pacer placed? Yes  - Was patient intubated pre/post CPR? Switched cuffless trach to cuffed trach and connected to mech vent    Medications Administered: Y = Yes; Blank = No Amiodarone    Atropine          Calcium    Epinephrine          2  Lidocaine    Magnesium    Norepinephrine    Phenylephrine    Sodium bicarbonate          4  Vasopressin      Post CPR evaluation:  - Final Status - Was patient successfully resuscitated ? Yes - What is current rhythm? paced - What is current hemodynamic status? tenuous   Miscellaneous Information:  - Labs sent, including: no  - Primary team notified?  Yes  - Family Notified? pending  - Additional notes/ transfer status: Already in the ICU   Scatliffe, Gypsy Balsam, MD  10/24/2018, 4:15 AM

## 2018-10-24 NOTE — Consult Note (Addendum)
ELECTROPHYSIOLOGY CONSULT NOTE    Primary Care Physician: Lars Mage, MD Referring Physician:  Dr Vaughan Browner  Admit Date: 10/16/2018  Reason for consultation:  AV block  Rachael Robertson is a 68 y.o. female with a h/o morbid obesity (bmi 71), chronic respiratory failure with home trach, and renal failure.  She is now admitted with new onset heart block in the setting of acute kidney disease and K of 7.  She is also quite acidotic.  She has been evaluated by general cardiology and felt to not be a candidate for cardiology procedures due to her acute illness and massive obesity.  She has required transcutaneous pacing overnight.  Efforts of K correction are currently in place with CVVHD.  The patient arouses but is currently not verbal and unable to provide additional history.  She appears comfortable. Rachael Robertson's note is also reviewed which reveals extensive skin necrosis with increased infectious risks and currently nonoperative state.   Past Medical History:  Diagnosis Date  . Arthritis   . Asthma   . CHF (congestive heart failure) ([place])   . COPD (chronic obstructive pulmonary disease) ([place])   . Obesity   . Opiate use   . Renal disorder   . Tracheostomy in place [facility])    Past Surgical History:  Procedure Laterality Date  . HEMATOMA EVACUATION Right 07/29/2018   Procedure: EVACUATION HEMATOMA OF RIGHT LEG WITH  ACELL AND VAC PLACEMENT;  Surgeon: Wallace Going, DO;  Location: [facility];  Service: Plastics;  Laterality: Right;  . Perianal abscess    . TRACHELECTOMY      . chlorhexidine gluconate (MEDLINE KIT)  15 mL Mouth Rinse BID  . fentaNYL      . heparin  1,000 Units Intravenous STAT  . heparin  5,000 Units Subcutaneous Q8H  . ipratropium-albuterol  3 mL Nebulization Q6H  . mouth rinse  15 mL Mouth Rinse 10 times per day  . pantoprazole (PROTONIX) IV  40 mg Intravenous QHS  . sodium bicarbonate  50 mEq Intravenous Once  . sodium zirconium cyclosilicate  10 g Oral Once   . sodium chloride 10 mL/hr at 10/24/18 0800  . sodium chloride    . angiotensin II (GIAPREZA) infusion 5 ng/kg/min (10/24/18 0858)  . ceFEPime (MAXIPIME) IV 2 g (10/24/18 0919)  . clindamycin (CLEOCIN) IV Stopped (10/24/18 0457)  . DOPamine 20 mcg/kg/min (10/24/18 0857)  . epinephrine 20 mcg/min (10/24/18 0857)  . heparin 10,000 units/ 20 mL infusion syringe    . metronidazole    . prismasol BGK 0/2.5 1,500 mL/hr at 10/24/18 0612  . prismasol BGK 2/2.5 replacement solution 400 mL/hr at 10/24/18 0609  . prismasol BGK 2/2.5 replacement solution 400 mL/hr at 10/24/18 0605  . [START ON 10-28-18] vancomycin    . vasopressin (PITRESSIN) infusion - *FOR SHOCK* 0.03 Units/min (10/24/18 0859)    Allergies  Allergen Reactions  . Other Anaphylaxis    Melons  . Fish Allergy Diarrhea  . Fruit & Vegetable Daily [Nutritional Supplements] Diarrhea    Have to be cooked or vine-ripened  . Lactose Intolerance (Gi) Diarrhea  . Naproxen Other (See Comments)    Makes her "congested"  . Sulfa Antibiotics Hives  . Erythromycin Hives and Rash  . Latex Rash  . Mupirocin Rash  . Tape Rash and Other (See Comments)    Plastic tape (alo) BURNS, but Tegaderm is tolerated    Social History   Socioeconomic History  . Marital status: Divorced    Spouse name: Not on  file  . Number of children: Not on file  . Years of education: Not on file  . Highest education level: Not on file  Occupational History  . Not on file  Social Needs  . Financial resource strain: Not on file  . Food insecurity:    Worry: Not on file    Inability: Not on file  . Transportation needs:    Medical: Not on file    Non-medical: Not on file  Tobacco Use  . Smoking status: Passive Smoke Exposure - Never Smoker  . Smokeless tobacco: Never Used  Substance and Sexual Activity  . Alcohol use: Yes    Comment: 1-2 per year   . Drug use: Not Currently  . Sexual activity: Not on file  Lifestyle  . Physical activity:    Days  per week: Not on file    Minutes per session: Not on file  . Stress: Not on file  Relationships  . Social connections:    Talks on phone: Not on file    Gets together: Not on file    Attends religious service: Not on file    Active member of club or organization: Not on file    Attends meetings of clubs or organizations: Not on file    Relationship status: Not on file  . Intimate partner violence:    Fear of current or ex partner: Not on file    Emotionally abused: Not on file    Physically abused: Not on file    Forced sexual activity: Not on file  Other Topics Concern  . Not on file  Social History Narrative  . Not on file    Family History  Problem Relation Age of Onset  . Aneurysm Mother   . Heart disease Father   . Skin cancer Father   . Sleep apnea Father        TRACHEOSTOMY AS WELL  . Breast cancer Sister     ROS- pt unable to provide  Physical Exam: Telemetry:  Complete heart block with external pacing Vitals:   10/24/18 0700 10/24/18 0715 10/24/18 0800 10/24/18 0822  BP:      Pulse:      Resp: 19 (!) 24 (!) 28   Temp:   97.8 F (36.6 C)   TempSrc:   Oral   SpO2:    100%  Weight:      Height:        GEN- The patient is very ill appearing, she is in extremis.  She rouses to voice but is not currently able to speak.  She is being externally paced.  She is massively obese. Head- normocephalic, atraumatic Eyes-  Sclera clear, conjunctiva pink Ears- hearing intact Oropharynx- op clear Neck- trach Lungs- tach in place Heart- externally pacing GI- soft  Extremities- + marked edema throughout with venous stasis changes MS- diffuse atrophy Skin- multiple pressure wounds noted,  Venous stasis changes Psych- comfortable, otherwise unable to assess Neuro- lethargic  EKG- agonal rhythm in the setting of hyperkalemia is noted  Labs:   Lab Results  Component Value Date   WBC 19.4 (H) 10/22/2018   HGB 13.6 10/24/2018   HCT 40.0 10/24/2018   MCV 85.9  11/07/2018   PLT 337 10/14/2018    Recent Labs  Lab 10/22/2018 2224  10/24/18 0652  NA 126*   < > 126*  K >7.5*   < > 6.8*  CL 92*   < > 90*  CO2 24  --  20*  BUN 121*   < > 113*  CREATININE 3.99*   < > 3.86*  CALCIUM 8.8*  --  8.8*  PROT 6.9  --   --   BILITOT 0.7  --   --   ALKPHOS 298*  --   --   ALT 19  --   --   AST 22  --   --   GLUCOSE 77   < > 185*   < > = values in this interval not displayed.     Echo:  05/07/18- EF 45%  ASSESSMENT AND PLAN:   1. Complete heart block Occurs in the setting of hyperkalemia and metabolic acidosis. She is externally pacing but appears reasonably comfortable with this currently. Efforts are in progress to correct her acidosis and hyperkalemia. Her prognosis is very very poor.  I agree completely with Dr Raiford Simmonds that she is not a candidate for temporary or permanent transvenous pacemaker in the setting of her massive obesity and open skin wounds with infection.  I also think that temporary pacing wire carries a very high risk for her.  I would advise that we continue transcuteneous pacing with mild sedation as required and aggressive correction of underlying causes. If she does not have return of her conduction with correction of her hyperkalemia and metabolic acidosis, this will likely be a terminal event for her. I have spoken with Dr Vaughan Browner at length about the patient.  He will continue to try to reach family (currently unavailable) to have discussions about goals of care.  I think that palliative conversations are most appropriate. Continue dopamine and pressure support in the interim.  Critical care time was exclusive of separate billable procedures and treating other patients.  Critial care time was spent personally by me (independant of midlevel providers) on the following activities: development of treatment plan with patient as well as nursing, discussions with Dr Vaughan Browner, evaluation of patients response to treatment, examining patient,  ordering/ reviewing treatments/ interventions, lab studies, radiographic studies, pulse ox, and re-evaluation of patients condition.     The patient is critically ill with multiple organ systems failure and requires high complexity decision making for assessment and support, frequent evaluation and titration of therapies, application of advanced monitoring technologies and extensive interpretation of databases.   Critical care was necessary to treat or prevent immintent or life-threatening deterioration.  Total CCT spent directly with the patient today is 45 min  Thompson Grayer MD, [facility] 10/24/2018 9:35 AM

## 2018-10-24 NOTE — Procedures (Signed)
Central Venous Catheter Insertion Procedure Note Ngela Donze [id number] 07-08-1950  Procedure: Insertion of Central Venous Catheter Indications: Assessment of intravascular volume and RRT  Procedure Details Consent: Unable to obtain consent because of emergent medical necessity. Time Out: Verified patient identification, verified procedure, site/side was marked, verified correct patient position, special equipment/implants available, medications/allergies/relevent history reviewed, required imaging and test results available.  Performed  Maximum sterile technique was used including antiseptics, cap, gloves, gown, hand hygiene, mask and sheet. Skin prep: Chlorhexidine; local anesthetic administered A antimicrobial bonded/coated triple lumen catheter was placed in the left internal jugular vein using the Seldinger technique.  Evaluation Blood flow good Complications: No apparent complications Patient did tolerate procedure well. Chest X-ray ordered to verify placement.  CXR: pending.  Gypsy Balsam Scatliffe 10/24/2018, 12:30 AM

## 2018-10-24 NOTE — Code Documentation (Signed)
BP 89/64, HR 70 (Paced), 99 O2, 32 R

## 2018-10-24 NOTE — Code Documentation (Signed)
69/43 BP

## 2018-10-24 NOTE — Code Documentation (Addendum)
HR 29, 33 R, 100% O2

## 2018-10-24 NOTE — Progress Notes (Addendum)
..   NAME:  Rachael Robertson, MRN:  [id number], DOB:  06-28-1950, LOS: 0 ADMISSION DATE:  09-Nov-2018, CONSULTATION DATE:  11-09-2018 REFERRING MD:  Dr. Dalene Seltzer CHIEF COMPLAINT:  Acute Heart Block and pain in RLQ wound     Brief History   68 year old morbidly obese female with a past medical history significant for CHF, COPD, OHS, chronic respiratory failure with a cuffless trach in place presents with right lower quadrant wound and septic shock with complete AV block requiring external placing and Dopamine, found to be hyperkalemic and in Acute renal failure secondary to ATN requiring emergent CRRT. PCCM cons for admission   Past Medical History  .Marland Kitchen Active Ambulatory Problems    Diagnosis Date Noted  . Chronic respiratory failure with hypoxia (HCC) 05/07/2018  . Hemoptysis   . OSA (obstructive sleep apnea)   . Tracheostomy status (HCC)   . Hypoxemia   . Lethargy   . Acute on chronic systolic heart failure (HCC) 05/20/2018  . Arthritis 05/20/2018  . Other chronic pain 05/20/2018  . Right Lower Lobe PNU  05/28/2018  . Tracheostomy complication (HCC)   . Subcutaneous hematoma 07/26/2018  . Acute blood loss anemia 07/26/2018  . Hematuria 07/26/2018  . Lymphedema of both lower extremities 08/10/2018  . Leg wound, right 08/10/2018  . Acute on chronic respiratory failure with hypoxia (HCC) 08/18/2018  . Acute pulmonary edema (HCC)   . Morbid obesity (HCC)   . Hypothyroidism   . Chronic gout without tophus   . Hx of pulmonary embolus   . Hematoma of leg, right, initial encounter   . Oxygen dependent   . Abdominal wall hematoma   . Right lower quadrant abdominal mass 10/21/2018  . Acute renal failure (HCC)   . Bradycardia, severe sinus   . Hyperkalemia   . Septic shock (HCC)   . Encounter for continuous renal replacement therapy (CRRT) for acute renal failure [facility])    Resolved Ambulatory Problems    Diagnosis Date Noted  . No Resolved Ambulatory Problems   Past Medical  History:  Diagnosis Date  . Asthma   . CHF (congestive heart failure) (HCC)   . COPD (chronic obstructive pulmonary disease) (HCC)   . Obesity   . Opiate use   . Renal disorder   . Tracheostomy in place [facility])      Significant Hospital Events   5/17- Externally paced, Hyperkalemia >7. Started on CRRT, trach changed to cuffed and ventilated. Coded for 8 mins  Consults:  11-09-18>>>>Surgery 2018-11-09>>>>Nephrology Nov 09, 2018>>>>>Cardiology 10/13/2018>>>>>>PCCM Procedures:  10/24/2018>>>>Left IJ CVC for RRT  Significant Diagnostic Tests:  Chest x-ray 5/17-HD catheter in appropriate position, trach in place.  Cardiomegaly.  SARS-CoV-2 negative  EKG: Complete AV block with wide QRS complex Right bundle branch block>>>>>>> heart rate in the 20s.  QTC 427  Micro Data:  2018-11-09>>>>>>>SARSCOV2 negative 11/09/18>>>>>>> Blood cx x 2 drawn  Antimicrobials:  November 09, 2018>>>>>Vancomycin 09-Nov-2018>>>>>>Cefepime Nov 09, 2018>>>>>>>Flagyl 10/24/2018>>>>>Clindamycin   Interim history/subjective:  Events of last night noted.  Patient had cardiac arrest for 8 minutes today morning requiring epi and sodium bicarbonate Continues on CRRT.  Requiring multiple pressors including epi, GI pressor, dopamine vasopressin.  Objective   Blood pressure (!) 117/102, pulse 79, temperature 98.4 F (36.9 C), resp. rate (!) 24, height  (1.6 m), weight (!) 183.3 kg, SpO2 100 %.    Vent Mode: PRVC FiO2 (%):  [40 %-100 %] 100 % Set Rate:  [24 bmp] 24 bmp Vt Set:  [420 mL] 420 mL PEEP:  [5 cmH20] 5  cmH20 Plateau Pressure:  [21 cmH20] 21 cmH20  No intake or output data in the 24 hours ending 10/24/18 0737 Filed Weights   11-10-18 2009  Weight: (!) 183.3 kg    Examination: Gen:      Anxious in mild distress. HEENT:  EOMI, sclera anicteric trach in place Neck:     No masses; no thyromegaly Lungs:    Clear to auscultation bilaterally; normal respiratory effort CV:         Regular rate and rhythm; no  murmurs Abd:      Obese abdomen, wound on right lower quadrant with purulent necrotic soft tissue, surrounding ecchymosis Ext:    Erythematous, 2-3+ pitting edema, cellulitic changes. Skin:      Warm and dry; no rash Neuro: Awake, responsive  Assessment & Plan:  68 year old with severe septic shock, renal failure with hyperkalemia, complete heart block in the setting of right lower necrotic abdominal wound  Acute renal failure secondary to possible ATN Plan: Started on emergent dialysis CRRT orders per Renal F/u electrolytes and CMP  Cardiogenic and Septic shock Sepsis Source: right lower quadrant wound with necrotic continuous and soft tissue involvement. active infection>>>sepsis with vasodilation but also ppt ATN and eventual ARF>>profound metabolic derangements and hyperkalemia contributing to Bradycardia. Plan: Continue empiric antibiotics for now On multiple pressors.  Wean down as tolerated Follow lactic acid, cultures  Unstable Bradycardia: Complete heart block with escape rhythm in the 20s Seen by Cardiology appreciate recs Patient is not transvenous pacemaker candidate at this time point per Cardiology Reversible cause Plan: Continue external pacing, continuous cardiac monitoring  RLQ Abdominal wound  Evaluated by surgery. Plan: We will need surgical debridement at some point but too unstable right now. Discussed with surgery service.  Chronic Respiratory Failure Multifactorial: h/o Asthma, COPD, OHS, and OSA Has a cuffless tracheostomy tube At home on BIpap AVAPS Plan: Continue full vent support Increase respiratory rate to 28 to help with hypercarbia.  Follow ABG. SARSCOV2 negative  Goals of care Overall poor prognosis given comorbidities, multiorgan failure and severe shock I tried calling her sister and niece today because a voicemail.  Asked for a call back.  Best practice:  Diet: N.p.o. Pain/Anxiety/Delirium protocol (if indicated): On hold due to  blood pressure and heart rate VAP protocol (if indicated): Not intubated DVT prophylaxis: Heparin subcu, SCDs if tolerated GI prophylaxis: Protonix IV Glucose control: Currently hypoglycemic not requiring insulin sliding scale Mobility: Bedrest bariatric bed Code Status: Full Family Communication: Per EMR Disposition: ICU Prognosis: Patient has a high risk of decompensation given multi organ failure  Labs   CBC: Recent Labs  Lab 11-10-2018 2224 2018/11/10 2231 2018-11-10 2312 10/24/18 0445 10/24/18 0603  WBC 19.4*  --   --   --   --   NEUTROABS 15.3*  --   --   --   --   HGB 10.1* 11.9* 11.6* 12.6 13.6  HCT 34.0* 35.0* 34.0* 37.0 40.0  MCV 85.9  --   --   --   --   PLT 337  --   --   --   --     Basic Metabolic Panel: Recent Labs  Lab 2018/11/10 2224 2018/11/10 2231 10-Nov-2018 2312 10/24/18 0445 10/24/18 0603  NA 126* 123* 123* 123* 125*  K >7.5* 7.5* 7.2* 7.4* 7.3*  CL 92* 95* 96*  --   --   CO2 24  --   --   --   --   GLUCOSE 77 74 131*  --   --  BUN 121* 124* 126*  --   --   CREATININE 3.99* 4.60* 4.60*  --   --   CALCIUM 8.8*  --   --   --   --    GFR: Estimated Creatinine Clearance: 20.2 mL/min (A) (by C-G formula based on SCr of 4.6 mg/dL (H)). Recent Labs  Lab 04/13/19 2224 04/13/19 2259  WBC 19.4*  --   LATICACIDVEN  --  1.7    Liver Function Tests: Recent Labs  Lab 04/13/19 2224  AST 22  ALT 19  ALKPHOS 298*  BILITOT 0.7  PROT 6.9  ALBUMIN 1.3*   No results for input(s): LIPASE, AMYLASE in the last 168 hours. No results for input(s): AMMONIA in the last 168 hours.  ABG    Component Value Date/Time   PHART 7.196 (LL) 10/24/2018 0603   PCO2ART 54.9 (H) 10/24/2018 0603   PO2ART 231.0 (H) 10/24/2018 0603   HCO3 21.3 10/24/2018 0603   TCO2 23 10/24/2018 0603   ACIDBASEDEF 7.0 (H) 10/24/2018 0603   O2SAT 100.0 10/24/2018 0603     Coagulation Profile: No results for input(s): INR, PROTIME in the last 168 hours.  Cardiac Enzymes: No results  for input(s): CKTOTAL, CKMB, CKMBINDEX, TROPONINI in the last 168 hours.  HbA1C: No results found for: HGBA1C  CBG: Recent Labs  Lab 04/13/19 2237 04/13/19 2251 10/24/18 0218 10/24/18 0232  GLUCAP 79 152* 55* 135*   The patient is critically ill with multiple organ system failure and requires high complexity decision making for assessment and support, frequent evaluation and titration of therapies, advanced monitoring, review of radiographic studies and interpretation of complex data.   Critical Care Time devoted to patient care services, exclusive of separately billable procedures, described in this note is 40 minutes.   Chilton GreathousePraveen Kera Deacon MD [place] [facility] Pager [phone] If no answer call [phone] 10/24/2018, 7:38 AM

## 2018-10-24 NOTE — Progress Notes (Signed)
Code blue called. Pt on PEA. Epi and bicarb given. Pt is been paced per ZOLL on rate of 80. SEE code notes.  After ROSC returned, 10 units insulin, 1 amp d50,  Bicarb gtt wide open given at 0400 per MD order. In adittion 1LNS bolus, 1 amp bicarb given after ABG resulted per MD order. MD at bedside gave epi as well. See MD notes.  See VS flowsheets as well.

## 2018-10-24 NOTE — Progress Notes (Signed)
CRITICAL VALUE ALERT  Critical Value:  K: 6.8   Lactic Acid: 3.6  Date & Time Notied:  10/24/18 0845  Provider Notified: Chilton Greathouse, MD   Orders Received/Actions taken: No new orders at this time    Rachael Child, RN

## 2018-10-24 NOTE — Progress Notes (Signed)
   10/24/18 0349  Clinical Encounter Type  Visited With Health care provider  Visit Type Initial;Code;Critical Care  Referral From Nurse   Chaplain responded to a code blue. No family is present at this time. Spiritual care services available as needed.   Alda Ponder, Chaplain

## 2018-10-24 NOTE — Consult Note (Signed)
Reason for Consult:abdminal wound Referring Physician: Dr. Ander Purpura is an 68 y.o. female.  HPI: Pt is a 89 F w/ h/o CHF, COPD, morbid obesity, sleep apnea, asthma who comes in with concern to abdominal wall wound.   Pt is extremely SOB and difficult to provide history.  Hx obtained from Epic records and EDP.  According to Epic records, pt had been recently seen by PCP and was noted to have a RLQ SQ fluid collection that had been there for "years".  Pt is unsure when this became necrotic or began draining.  Pt states that she has pain in the area.  Pt w/cardiac arrythmias in ED likely d/t hyperkalemia and undergoing external pacing.  Pt in AKI, K>7.5. Elev WBC 19.5  Prev CT of A/P in 07/2018 shows SQ fluid collection in RLQ but CT does not capture entire SQ abdominal wall d/t body habitus  EF 45-50% in 04/2018  Past Medical History:  Diagnosis Date  . Arthritis   . Asthma   . CHF (congestive heart failure) (HCC)   . COPD (chronic obstructive pulmonary disease) (HCC)   . Obesity   . Opiate use   . Renal disorder   . Tracheostomy in place [facility])     Past Surgical History:  Procedure Laterality Date  . HEMATOMA EVACUATION Right 07/29/2018   Procedure: EVACUATION HEMATOMA OF RIGHT LEG WITH  ACELL AND VAC PLACEMENT;  Surgeon: Peggye Form, DO;  Location: [facility] OR;  Service: Plastics;  Laterality: Right;  . Perianal abscess    . TRACHELECTOMY      Family History  Problem Relation Age of Onset  . Aneurysm Mother   . Heart disease Father   . Skin cancer Father   . Sleep apnea Father        TRACHEOSTOMY AS WELL  . Breast cancer Sister     Social History:  reports that she is a non-smoker but has been exposed to tobacco smoke. She has never used smokeless tobacco. She reports current alcohol use. She reports previous drug use.  Allergies:  Allergies  Allergen Reactions  . Other Anaphylaxis    Melons  . Fish Allergy Diarrhea  . Fruit & Vegetable Daily  [Nutritional Supplements] Diarrhea    Have to be cooked or vine-ripened  . Lactose Intolerance (Gi) Diarrhea  . Naproxen Other (See Comments)    Makes her "congested"  . Sulfa Antibiotics Hives  . Erythromycin Hives and Rash  . Latex Rash  . Mupirocin Rash  . Tape Rash and Other (See Comments)    Plastic tape (alo) BURNS, but Tegaderm is tolerated    Medications: I have reviewed the patient's current medications.  Results for orders placed or performed during the hospital encounter of 10/13/2018 (from the past 48 hour(s))  Comprehensive metabolic panel     Status: Abnormal   Collection Time: 10/26/2018 10:24 PM  Result Value Ref Range   Sodium 126 (L) 135 - 145 mmol/L   Potassium >7.5 (HH) 3.5 - 5.1 mmol/L    Comment: NO VISIBLE HEMOLYSIS CRITICAL RESULT CALLED TO, READ BACK BY AND VERIFIED WITH: BROOKS,M RN 10/24/2018 0001 JORDANS    Chloride 92 (L) 98 - 111 mmol/L   CO2 24 22 - 32 mmol/L   Glucose, Bld 77 70 - 99 mg/dL   BUN 161 (H) 8 - 23 mg/dL   Creatinine, Ser 0.96 (H) 0.44 - 1.00 mg/dL   Calcium 8.8 (L) 8.9 - 10.3 mg/dL   Total Protein 6.9  6.5 - 8.1 g/dL   Albumin 1.3 (L) 3.5 - 5.0 g/dL   AST 22 15 - 41 U/L   ALT 19 0 - 44 U/L   Alkaline Phosphatase 298 (H) 38 - 126 U/L   Total Bilirubin 0.7 0.3 - 1.2 mg/dL   GFR calc non Af Amer 11 (L) >60 mL/min   GFR calc Af Amer 13 (L) >60 mL/min   Anion gap 10 5 - 15    Comment: Performed at [facility], [address], [place], [place] [address]  CBC WITH DIFFERENTIAL     Status: Abnormal   Collection Time: 10/17/2018 10:24 PM  Result Value Ref Range   WBC 19.4 (H) 4.0 - 10.5 K/uL   RBC 3.96 3.87 - 5.11 MIL/uL   Hemoglobin 10.1 (L) 12.0 - 15.0 g/dL   HCT 23.3 (L) 43.5 - 68.6 %   MCV 85.9 80.0 - 100.0 fL   MCH 25.5 (L) 26.0 - 34.0 pg   MCHC 29.7 (L) 30.0 - 36.0 g/dL   RDW 16.8 (H) 37.2 - 90.2 %   Platelets 337 150 - 400 K/uL   nRBC 1.2 (H) 0.0 - 0.2 %   Neutrophils Relative % 78 %   Neutro Abs 15.3 (H) 1.7 - 7.7 K/uL    Lymphocytes Relative 6 %   Lymphs Abs 1.1 0.7 - 4.0 K/uL   Monocytes Relative 2 %   Monocytes Absolute 0.3 0.1 - 1.0 K/uL   Eosinophils Relative 0 %   Eosinophils Absolute 0.0 0.0 - 0.5 K/uL   Basophils Relative 1 %   Basophils Absolute 0.1 0.0 - 0.1 K/uL   Immature Granulocytes 13 %   Abs Immature Granulocytes 2.45 (H) 0.00 - 0.07 K/uL   Smudge Cells PRESENT     Comment: Performed at [facility], [address] [place], [place] [address]  Blood Culture (routine x 2)     Status: None (Preliminary result)   Collection Time: 11/01/2018 10:24 PM  Result Value Ref Range   Specimen Description BLOOD LEFT ANTECUBITAL    Special Requests      BOTTLES DRAWN AEROBIC AND ANAEROBIC Blood Culture results may not be optimal due to an inadequate volume of blood received in culture bottles Performed at [facility], [address], [place], [place] [address]    Culture PENDING    Report Status PENDING   I-stat chem 8, ED [facility] and [facility] only)     Status: Abnormal   Collection Time: 10/15/2018 10:31 PM  Result Value Ref Range   Sodium 123 (L) 135 - 145 mmol/L   Potassium 7.5 (HH) 3.5 - 5.1 mmol/L   Chloride 95 (L) 98 - 111 mmol/L   BUN 124 (H) 8 - 23 mg/dL   Creatinine, Ser 2.33 (H) 0.44 - 1.00 mg/dL   Glucose, Bld 74 70 - 99 mg/dL   Calcium, Ion 6.12 (L) 1.15 - 1.40 mmol/L   TCO2 25 22 - 32 mmol/L   Hemoglobin 11.9 (L) 12.0 - 15.0 g/dL   HCT 24.4 (L) 97.5 - 30.0 %   Comment NOTIFIED PHYSICIAN   CBG monitoring, ED     Status: None   Collection Time: 11/05/2018 10:37 PM  Result Value Ref Range   Glucose-Capillary 79 70 - 99 mg/dL  CBG monitoring, ED     Status: Abnormal   Collection Time: 10/19/2018 10:51 PM  Result Value Ref Range   Glucose-Capillary 152 (H) 70 - 99 mg/dL  Lactic acid,  plasma     Status: None   Collection Time: Nov 07, 2018 10:59 PM  Result Value Ref Range   Lactic Acid, Venous 1.7 0.5 - 1.9 mmol/L    Comment: Performed at [facility], [address]  [place], [place] [address]  I-stat chem 8, ED [facility] and [facility] only)     Status: Abnormal   Collection Time: 07-Nov-2018 11:12 PM  Result Value Ref Range   Sodium 123 (L) 135 - 145 mmol/L   Potassium 7.2 (HH) 3.5 - 5.1 mmol/L   Chloride 96 (L) 98 - 111 mmol/L   BUN 126 (H) 8 - 23 mg/dL   Creatinine, Ser 8.11 (H) 0.44 - 1.00 mg/dL   Glucose, Bld 914 (H) 70 - 99 mg/dL   Calcium, Ion 7.82 (L) 1.15 - 1.40 mmol/L   TCO2 23 22 - 32 mmol/L   Hemoglobin 11.6 (L) 12.0 - 15.0 g/dL   HCT 95.6 (L) 21.3 - 08.6 %   Comment NOTIFIED PHYSICIAN     Dg Chest Port 1 View  Result Date: 11/07/18 CLINICAL DATA:  Sepsis EXAM: PORTABLE CHEST 1 VIEW COMPARISON:  08/21/2018 FINDINGS: Tracheostomy tube tip projects over the upper trachea. Mild cardiomegaly. No focal airspace consolidation or pulmonary edema. IMPRESSION: No active disease. Electronically Signed   By: Deatra Robinson M.D.   On: 11-07-18 22:30    Review of Systems  Constitutional: Negative for chills, fever and malaise/fatigue.  HENT: Negative for ear discharge, hearing loss and sore throat.   Eyes: Negative for blurred vision and discharge.  Respiratory: Negative for cough and shortness of breath.   Cardiovascular: Negative for chest pain, orthopnea and leg swelling.  Gastrointestinal: Negative for abdominal pain, constipation, diarrhea, heartburn, nausea and vomiting.  Musculoskeletal: Negative for myalgias and neck pain.  Skin: Negative for itching and rash.  Neurological: Negative for dizziness, focal weakness, seizures and loss of consciousness.  Endo/Heme/Allergies: Negative for environmental allergies. Does not bruise/bleed easily.  Psychiatric/Behavioral: Negative for depression and suicidal ideas.  All other systems reviewed and are negative.  Blood pressure (!) 100/48, pulse 68, temperature (!) 96.1 F (35.6 C), temperature source Axillary, resp. rate 17, weight (!) 183.3 kg, SpO2 100 %. Physical Exam  Constitutional: She is oriented to  person, place, and time. Vital signs are normal. She appears well-developed and well-nourished.  Conversant No acute distress  HENT:  Head: Normocephalic.  Eyes: Pupils are equal, round, and reactive to light. Conjunctivae and lids are normal. No scleral icterus.  No lid lag Moist conjunctiva  Neck: No tracheal tenderness present. No thyromegaly present.   Trach in place  Cardiovascular: Normal rate, regular rhythm and intact distal pulses.  No murmur heard. Respiratory: Effort normal and breath sounds normal. She has no wheezes. She has no rales.  GI: Soft. Bowel sounds are normal. She exhibits no distension. There is no hepatosplenomegaly. There is no abdominal tenderness. There is no rebound and no guarding. No hernia.    Morbidly obese  Neurological: She is alert and oriented to person, place, and time.  Normal gait and station  Skin: Skin is warm. No rash noted. No cyanosis. Nails show no clubbing.  Lymphedema to BLE Anasarca  Psychiatric: Judgment normal.  Appropriate affect    Assessment/Plan: 68 F with abdominal wall wound Hyperkalemia AKI Bradycardia CHF COPD morbid obesity sleep apnea Asthma  Plan: Pt is being treated currently with pacing pads.  HD catheter placed and to undergo CVVHD tonight for hyperkalemia.  Agree with Abx d/w CCM  at bedside to add Clindamycin to regimen.   1. Pt will need surgical debridement of wound when K is corrected.  We will con't to follow.  Anticipate possible surgical intervention within 24-48hrs.  Pt has multiple comorbidites and is a high risk surgical candidate.      Axel Fillerrmando Jensen [place] 10/24/2018, 12:02 AM

## 2018-10-24 NOTE — ED Provider Notes (Signed)
Called to patient's bedside after patient's heart rate dropped to the 20s, blood pressure in the 70s.  On review of rhythm strip heart rate ranging 30s to 50s clinically complete heart block.  EKG ordered and reviewed confirmed heart block.  With presentation concern for possible hyperkalemia, lab results not back yet.  IV calcium given, bicarb, pacing initiated 70 heart rate, adjusting milliamps up to 70.  Fentanyl as needed.  Discussed with cardiology however given the potassium returned 7.5 we will continue with metabolic/resuscitation efforts.  IV dextrose and IV insulin ordered and given.  Appreciate assistance of nursing staff who is able to get 3 peripheral IVs to help.  Dopamine ordered and titrated.  Patient paced to help maintain blood pressure.  Patient mentating on multiple reassessments.  Discussed the case with nephrology on-call, critical care and cardiology.  Dr. Dalene Seltzer is the primary emergency physician on the case I was assisting when multiple complicated patients came in at the same time.  Please see her note for further details and surgery consult and CT results.  CRITICAL CARE Performed by: Enid Skeens  Total critical care time: 40 minutes  Critical care time was exclusive of separately billable procedures and treating other patients.  Critical care was necessary to treat or prevent imminent or life-threatening deterioration.  Critical care was time spent personally by me on the following activities: development of treatment plan with patient and/or surrogate as well as nursing, discussions with consultants, evaluation of patient's response to treatment, examination of patient, obtaining history from patient or surrogate, ordering and performing treatments and interventions, ordering and review of laboratory studies, ordering and review of radiographic studies, pulse oximetry and re-evaluation of patient's condition.  Pacing .Critical Care Performed by: Blane Ohara,  MD Authorized by: Blane Ohara, MD   Critical care provider statement:    Critical care time (minutes):  40   Critical care start time:  Nov 09, 2018 10:30 PM   Critical care end time:  10/24/2018 11:10 PM   Critical care time was exclusive of:  Separately billable procedures and treating other patients and teaching time   Critical care was necessary to treat or prevent imminent or life-threatening deterioration of the following conditions:  Cardiac failure   Critical care was time spent personally by me on the following activities:  Discussions with consultants, evaluation of patient's response to treatment, examination of patient, ordering and performing treatments and interventions, ordering and review of laboratory studies, ordering and review of radiographic studies, pulse oximetry, re-evaluation of patient's condition, obtaining history from patient or surrogate and review of old charts External pacer Date/Time: 10/24/2018 12:34 AM Performed by: Blane Ohara, MD Authorized by: Blane Ohara, MD  Consent: The procedure was performed in an emergent situation. Risks and benefits: risks, benefits and alternatives were discussed Consent given by: patient Patient understanding: patient states understanding of the procedure being performed Patient identity confirmed: verbally with patient and arm band Local anesthesia used: no  Anesthesia: Local anesthesia used: no  Sedation: Patient sedated: no  Patient tolerance: Patient tolerated the procedure well with no immediate complications Comments: Fentanyl prn Hr 70, milliamps 70   Procedures  Araceli Bouche Shakyra Mattera 12:35 AM    Blane Ohara, MD 10/24/18 [phone]

## 2018-10-24 NOTE — Progress Notes (Addendum)
PCCM progress note  Discussed with sister and niece over telephone.  Discussed her critical condition, multiorgan failure, poor prognosis.  They understand that she may not survive this hospitalization  The patient had previously expressed that she wants everything done.  Sister however understands the gravity of situation and agreed to no CPR, Defib.  Continue all other medical measures.  The patient is critically ill with multiple organ system failure and requires high complexity decision making for assessment and support, frequent evaluation and titration of therapies, advanced monitoring, review of radiographic studies and interpretation of complex data.   Critical Care Time devoted to patient care services, exclusive of separately billable procedures, described in this note is 35 minutes.   Rachael Greathouse MD [place] [facility] Pager [phone] If no answer call [phone] 10/24/2018, 10:59 AM

## 2018-10-24 NOTE — Code Documentation (Signed)
BP 90/73, HR 71, 100% O2, 47 R

## 2018-10-25 ENCOUNTER — Telehealth: Payer: Self-pay | Admitting: *Deleted

## 2018-10-25 ENCOUNTER — Inpatient Hospital Stay (HOSPITAL_COMMUNITY): Payer: Medicare Other

## 2018-10-25 DIAGNOSIS — J9600 Acute respiratory failure, unspecified whether with hypoxia or hypercapnia: Secondary | ICD-10-CM

## 2018-10-25 LAB — POCT ACTIVATED CLOTTING TIME
Activated Clotting Time: 197 seconds
Activated Clotting Time: 202 seconds
Activated Clotting Time: 208 seconds
Activated Clotting Time: 219 seconds
Activated Clotting Time: 191 seconds
Activated Clotting Time: 180 seconds
Activated Clotting Time: 202 seconds
Activated Clotting Time: 213 seconds
Activated Clotting Time: 592 seconds
Activated Clotting Time: 213 seconds

## 2018-10-25 LAB — MAGNESIUM: Magnesium: 2.2 mg/dL (ref 1.7–2.4)

## 2018-10-25 LAB — APTT: aPTT: 58 seconds — ABNORMAL HIGH (ref 24–36)

## 2018-10-25 LAB — POCT I-STAT 7, (LYTES, BLD GAS, ICA,H+H)
Acid-base deficit: 10 mmol/L — ABNORMAL HIGH (ref 0.0–2.0)
Bicarbonate: 16.5 mmol/L — ABNORMAL LOW (ref 20.0–28.0)
Calcium, Ion: 1.05 mmol/L — ABNORMAL LOW (ref 1.15–1.40)
HCT: 34 % — ABNORMAL LOW (ref 36.0–46.0)
Hemoglobin: 11.6 g/dL — ABNORMAL LOW (ref 12.0–15.0)
O2 Saturation: 99 %
Patient temperature: 98.3
Potassium: 5.3 mmol/L — ABNORMAL HIGH (ref 3.5–5.1)
Sodium: 126 mmol/L — ABNORMAL LOW (ref 135–145)
TCO2: 18 mmol/L — ABNORMAL LOW (ref 22–32)
pCO2 arterial: 36 mmHg (ref 32.0–48.0)
pH, Arterial: 7.267 — ABNORMAL LOW (ref 7.350–7.450)
pO2, Arterial: 156 mmHg — ABNORMAL HIGH (ref 83.0–108.0)

## 2018-10-25 LAB — RENAL FUNCTION PANEL
Albumin: 1 g/dL — ABNORMAL LOW (ref 3.5–5.0)
Anion gap: 16 — ABNORMAL HIGH (ref 5–15)
BUN: 67 mg/dL — ABNORMAL HIGH (ref 8–23)
CO2: 15 mmol/L — ABNORMAL LOW (ref 22–32)
Calcium: 7.8 mg/dL — ABNORMAL LOW (ref 8.9–10.3)
Chloride: 95 mmol/L — ABNORMAL LOW (ref 98–111)
Creatinine, Ser: 2.51 mg/dL — ABNORMAL HIGH (ref 0.44–1.00)
GFR calc Af Amer: 23 mL/min — ABNORMAL LOW (ref >60)
GFR calc non Af Amer: 19 mL/min — ABNORMAL LOW (ref >60)
Glucose, Bld: 155 mg/dL — ABNORMAL HIGH (ref 70–99)
Phosphorus: 5 mg/dL — ABNORMAL HIGH (ref 2.5–4.6)
Potassium: 5.5 mmol/L — ABNORMAL HIGH (ref 3.5–5.1)
Sodium: 126 mmol/L — ABNORMAL LOW (ref 135–145)

## 2018-10-25 LAB — CBC
HCT: 34.9 % — ABNORMAL LOW (ref 36.0–46.0)
Hemoglobin: 10.2 g/dL — ABNORMAL LOW (ref 12.0–15.0)
MCH: 25 pg — ABNORMAL LOW (ref 26.0–34.0)
MCHC: 29.2 g/dL — ABNORMAL LOW (ref 30.0–36.0)
MCV: 85.5 fL (ref 80.0–100.0)
Platelets: 201 10*3/uL (ref 150–400)
RBC: 4.08 MIL/uL (ref 3.87–5.11)
RDW: 18.9 % — ABNORMAL HIGH (ref 11.5–15.5)
WBC: 29.7 10*3/uL — ABNORMAL HIGH (ref 4.0–10.5)
nRBC: 2.1 % — ABNORMAL HIGH (ref 0.0–0.2)

## 2018-10-25 LAB — BASIC METABOLIC PANEL
Anion gap: 17 — ABNORMAL HIGH (ref 5–15)
BUN: 69 mg/dL — ABNORMAL HIGH (ref 8–23)
CO2: 14 mmol/L — ABNORMAL LOW (ref 22–32)
Calcium: 7.7 mg/dL — ABNORMAL LOW (ref 8.9–10.3)
Chloride: 95 mmol/L — ABNORMAL LOW (ref 98–111)
Creatinine, Ser: 2.66 mg/dL — ABNORMAL HIGH (ref 0.44–1.00)
GFR calc Af Amer: 21 mL/min — ABNORMAL LOW (ref >60)
GFR calc non Af Amer: 18 mL/min — ABNORMAL LOW (ref >60)
Glucose, Bld: 150 mg/dL — ABNORMAL HIGH (ref 70–99)
Potassium: 5.6 mmol/L — ABNORMAL HIGH (ref 3.5–5.1)
Sodium: 126 mmol/L — ABNORMAL LOW (ref 135–145)

## 2018-10-25 LAB — CULTURE, BLOOD (ROUTINE X 2)

## 2018-10-25 LAB — GLUCOSE, CAPILLARY
Glucose-Capillary: 84 mg/dL (ref 70–99)
Glucose-Capillary: 157 mg/dL — ABNORMAL HIGH (ref 70–99)

## 2018-10-25 LAB — PHOSPHORUS: Phosphorus: 5 mg/dL — ABNORMAL HIGH (ref 2.5–4.6)

## 2018-10-25 MED ORDER — FENTANYL CITRATE (PF) 100 MCG/2ML IJ SOLN: 50.0000 ug | INTRAMUSCULAR | Status: DC | PRN

## 2018-10-25 MED ORDER — MIDAZOLAM HCL 2 MG/2ML IJ SOLN: 1.0000 mg | INTRAMUSCULAR | Status: DC | PRN

## 2018-10-25 MED ORDER — HYDROCORTISONE NA SUCCINATE PF 100 MG IJ SOLR: 50.0000 mg | Freq: Four times a day (QID) | INTRAMUSCULAR | Status: DC

## 2018-10-25 MED ORDER — GLYCOPYRROLATE 1 MG PO TABS
1.0000 mg | ORAL_TABLET | ORAL | Status: DC | PRN
  Filled 2018-10-25: qty 1

## 2018-10-25 MED ORDER — CHLORHEXIDINE GLUCONATE CLOTH 2 % EX PADS: 6.0000 | MEDICATED_PAD | Freq: Every day | CUTANEOUS | Status: DC

## 2018-10-25 MED ORDER — POLYVINYL ALCOHOL 1.4 % OP SOLN
1.0000 [drp] | Freq: Four times a day (QID) | OPHTHALMIC | Status: DC | PRN
  Filled 2018-10-25: qty 15

## 2018-10-25 MED ORDER — FENTANYL 2500MCG IN NS 250ML (10MCG/ML) PREMIX INFUSION: 0.0000 ug/h | INTRAVENOUS | Status: DC

## 2018-10-25 MED ORDER — GLYCOPYRROLATE 0.2 MG/ML IJ SOLN: 0.2000 mg | INTRAMUSCULAR | Status: DC | PRN

## 2018-10-25 MED ORDER — FENTANYL BOLUS VIA INFUSION
100.0000 ug | INTRAVENOUS | Status: DC | PRN
  Filled 2018-10-25: qty 100

## 2018-10-25 MED ORDER — SODIUM CHLORIDE 0.9% FLUSH: 10.0000 mL | INTRAVENOUS | Status: DC | PRN

## 2018-10-25 MED ORDER — SODIUM CHLORIDE 0.9% FLUSH: 10.0000 mL | Freq: Two times a day (BID) | INTRAVENOUS | Status: DC

## 2018-10-26 MED FILL — Medication: Qty: 1 | Status: AC

## 2018-10-26 NOTE — Telephone Encounter (Signed)
Received original signed D/C-funeral home notified for pick up.  

## 2018-10-29 LAB — CULTURE, BLOOD (ROUTINE X 2)
Culture: NO GROWTH
Special Requests: ADEQUATE

## 2018-11-08 NOTE — Progress Notes (Signed)
Patient ID: Rachael Robertson, female   DOB: 05-30-1951, 68 y.o.   MRN: [id number]       Subjective: Patient on 5 pressors at this point.  Still transcutaneously paced for over 24 hrs.    ROS: unable to obtain due to critical illness  Objective: Vital signs in last 24 hours: Temp:  [97.7 F (36.5 C)-98.2 F (36.8 C)] 98.2 F (36.8 C) (05/18 0415) Pulse Rate:  [30-86] 78 (05/18 0730) Resp:  [19-37] 23 (05/18 0730) SpO2:  [90 %-100 %] 90 % (05/18 0730) Arterial Line BP: (71-149)/(44-112) 94/51 (05/18 0730) FiO2 (%):  [50 %-80 %] 50 % (05/18 0304) Weight:  [180.5 kg] 180.5 kg (05/18 0230) Last BM Date: (PTA)  Intake/Output from previous day: 05/17 0701 - 05/18 0700 In: 7164.9 [I.V.:5296.2; IV Piggyback:1868.6] Out: 3230 [Emesis/NG output:100] Intake/Output this shift: Total I/O In: -  Out: 270 [Other:270]  PE: Gen: critically ill female  Heart: transcutaneously paced Lungs: on vent Abd: significant abdominal wall necrosis c/w nec fasc.  Entirety of abdominal wall is also mottled.  See pictures from yesterday for further description. Ext: both legs are completely mottled.  RLE with anterior open wound that is relatively clean and covered with dressing.  Lab Results:  Recent Labs    2018/11/14 2224  10/16/2018 0403 10/29/2018 0428  WBC 19.4*  --  29.7*  --   HGB 10.1*   < > 10.2* 11.6*  HCT 34.0*   < > 34.9* 34.0*  PLT 337  --  201  --    < > = values in this interval not displayed.   BMET Recent Labs    10/24/18 1532  10/14/2018 0403 10/13/2018 0428  NA 125*   < > 126*  126* 126*  K 6.2*   < > 5.6*  5.5* 5.3*  CL 94*  --  95*  95*  --   CO2 17*  --  14*  15*  --   GLUCOSE 178*  --  150*  155*  --   BUN 94*  --  69*  67*  --   CREATININE 3.13*  --  2.66*  2.51*  --   CALCIUM 8.1*  --  7.7*  7.8*  --    < > = values in this interval not displayed.   PT/INR Recent Labs    10/24/18 1000  LABPROT 16.8*  INR 1.4*   CMP     Component Value Date/Time   NA  126 (L) 10/10/2018 0428   NA 142 05/20/2018 1446   K 5.3 (H) 11/04/2018 0428   CL 95 (L) 10/21/2018 0403   CL 95 (L) 10/22/2018 0403   CO2 15 (L) 10/24/2018 0403   CO2 14 (L) 10/30/2018 0403   GLUCOSE 155 (H) 10/15/2018 0403   GLUCOSE 150 (H) 10/17/2018 0403   BUN 67 (H) 10/19/2018 0403   BUN 69 (H) 10/11/2018 0403   BUN 39 (H) 05/20/2018 1446   CREATININE 2.51 (H) 11/07/2018 0403   CREATININE 2.66 (H) 11/04/2018 0403   CALCIUM 7.8 (L) 10/27/2018 0403   CALCIUM 7.7 (L) 10/19/2018 0403   PROT 6.9 14-Nov-2018 2224   ALBUMIN 1.0 (L) 10/22/2018 0403   AST 22 2018/11/14 2224   ALT 19 2018-11-14 2224   ALKPHOS 298 (H) Nov 14, 2018 2224   BILITOT 0.7 2018-11-14 2224   GFRNONAA 19 (L) 10/14/2018 0403   GFRNONAA 18 (L) 11/06/2018 0403   GFRAA 23 (L) 10/12/2018 0403   GFRAA 21 (L) 10/20/2018 0403  Lipase  No results found for: LIPASE     Studies/Results: Dg Chest 1 View  Result Date: 10/24/2018 CLINICAL DATA:  Central line placement EXAM: CHEST  1 VIEW COMPARISON:  10/24/2018 FINDINGS: The ET tube tip is above the carina. There is a right IJ catheter with tip in the projection of the cavoatrial junction. No pneumothorax after catheter placement. The left IJ catheter is stable in position with tip also projecting over the cavoatrial junction. ET tube tip is above the carina. Cardiac enlargement. Mild diffuse pulmonary edema. IMPRESSION: 1. Interval placement of right IJ catheter with tip projecting over the cavoatrial junction. No pneumothorax. 2. Cardiac enlargement and pulmonary edema. Electronically Signed   By: Signa Kell M.D.   On: 10/24/2018 17:42   Dg Abd 1 View  Result Date: 10/24/2018 CLINICAL DATA:  Evaluate OG tube EXAM: ABDOMEN - 1 VIEW COMPARISON:  None. FINDINGS: The lower chest was not included on today's study. However, the side port of the NG tube is below the GE junction in the distal tip is in the gastric fundus. IMPRESSION: The NG tube terminates in left upper  quadrant, within the fundus of the stomach. Electronically Signed   By: Gerome Sam III M.D   On: 10/24/2018 12:43   Dg Chest Port 1 View  Result Date: 10/24/2018 CLINICAL DATA:  Acute respiratory failure EXAM: PORTABLE CHEST 1 VIEW COMPARISON:  10/24/2018 FINDINGS: Cardiac shadow is stable. Tracheostomy tube, gastric catheter and bilateral jugular catheters are again noted and stable. Mild vascular congestion and mild pulmonary edema is seen similar to that noted on the prior exam. No focal confluent infiltrate is seen. No bony abnormality is noted. IMPRESSION: Stable vascular congestion and pulmonary edema. Tubes and lines stable in appearance. Electronically Signed   By: Alcide Clever M.D.   On: 10/19/2018 08:04   Dg Chest Portable 1 View  Result Date: 10/24/2018 CLINICAL DATA:  Central line placement EXAM: PORTABLE CHEST 1 VIEW COMPARISON:  2018-11-18 FINDINGS: Left IJ approach central venous catheter tip in the proximal right atrium. Examination otherwise unchanged. IMPRESSION: L IJ CVC tip in the proximal right atrium. Electronically Signed   By: Deatra Robinson M.D.   On: 10/24/2018 01:04   Dg Chest Port 1 View  Result Date: 11/18/18 CLINICAL DATA:  Sepsis EXAM: PORTABLE CHEST 1 VIEW COMPARISON:  08/21/2018 FINDINGS: Tracheostomy tube tip projects over the upper trachea. Mild cardiomegaly. No focal airspace consolidation or pulmonary edema. IMPRESSION: No active disease. Electronically Signed   By: Deatra Robinson M.D.   On: November 18, 2018 22:30    Anti-infectives: Anti-infectives (From admission, onward)   Start     Dose/Rate Route Frequency Ordered Stop   10/26/2018 1200  vancomycin (VANCOCIN) 1,500 mg in sodium chloride 0.9 % 500 mL IVPB     1,500 mg 250 mL/hr over 120 Minutes Intravenous Every 24 hours 10/24/18 0928     10/11/2018 0000  vancomycin (VANCOCIN) 1,500 mg in sodium chloride 0.9 % 500 mL IVPB  Status:  Discontinued     1,500 mg 250 mL/hr over 120 Minutes Intravenous Every 24  hours 10/24/18 0809 10/24/18 0928   10/24/18 2200  ceFEPIme (MAXIPIME) 2 g in sodium chloride 0.9 % 100 mL IVPB  Status:  Discontinued     2 g 200 mL/hr over 30 Minutes Intravenous Every 24 hours 2018/11/18 2241 2018-11-18 2358   10/24/18 1800  ceFEPIme (MAXIPIME) 2 g in sodium chloride 0.9 % 100 mL IVPB  Status:  Discontinued  2 g 200 mL/hr over 30 Minutes Intravenous Every 12 hours 10/24/18 0642 10/24/18 0809   10/24/18 1200  ceFEPIme (MAXIPIME) 2 g in sodium chloride 0.9 % 100 mL IVPB  Status:  Discontinued     2 g 200 mL/hr over 30 Minutes Intravenous Every 12 hours 10/18/2018 2358 10/24/18 0642   10/24/18 1000  ceFEPIme (MAXIPIME) 2 g in sodium chloride 0.9 % 100 mL IVPB  Status:  Discontinued     2 g 200 mL/hr over 30 Minutes Intravenous Every 24 hours 10/24/18 0809 10/24/18 0811   10/24/18 1000  ceFEPIme (MAXIPIME) 2 g in sodium chloride 0.9 % 100 mL IVPB     2 g 200 mL/hr over 30 Minutes Intravenous Every 12 hours 10/24/18 0811     10/24/18 0600  metroNIDAZOLE (FLAGYL) IVPB 500 mg     500 mg 100 mL/hr over 60 Minutes Intravenous Every 8 hours 10/24/18 0057     10/24/18 0100  clindamycin (CLEOCIN) IVPB 600 mg     600 mg 100 mL/hr over 30 Minutes Intravenous Every 8 hours 10/24/18 0047     10/11/2018 2300  vancomycin (VANCOCIN) 2,500 mg in sodium chloride 0.9 % 500 mL IVPB     2,500 mg 250 mL/hr over 120 Minutes Intravenous  Once 11/07/2018 2238 10/24/18 0748   10/29/2018 2215  ceFEPIme (MAXIPIME) 2 g in sodium chloride 0.9 % 100 mL IVPB  Status:  Discontinued     2 g 200 mL/hr over 30 Minutes Intravenous  Once 10/24/2018 2205 10/24/18 0810   10/29/2018 2215  metroNIDAZOLE (FLAGYL) IVPB 500 mg  Status:  Discontinued     500 mg 100 mL/hr over 60 Minutes Intravenous  Once 11/01/2018 2205 10/24/18 0810   11/01/2018 2215  vancomycin (VANCOCIN) IVPB 1000 mg/200 mL premix  Status:  Discontinued     1,000 mg 200 mL/hr over 60 Minutes Intravenous  Once 10/17/2018 2205 10/19/2018 2238        Assessment/Plan Chronic respiratory failure with a trach/COPD Complete heart block, externally paced ARF, on CRRT Hyperkalemia - K 5.6 on CRRT Septic shock - secondary to soft tissue infection, on 5 pressors H/O CHF Morbid obesity  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  Unfortunately now she is diffusely mottled across her abdomen and her BLE.  She remains transcutaneously paced as cardiology has said she is too high risk for venous pacing.  The patient will likely at this point, given progression of her infection, not recovery whether she has a surgery or not; however, her only chance to get better would be an operation.  According to CCM notes yesterday, the sister states the patient would want everything done.  She is not CPR, but continue aggressive medical management.  Dr. Dwain SarnaWakefield is going to attempt to call the sister today to further discuss this situation as it is incredibly grim no matter what.  FEN - NPO, on vent VTE - heparin ID - Maxipime, clindamycin, vancomycin, flagyl   LOS: 1 day    Letha CapeKelly E Tavious Griesinger , [facility] 2018/07/06, 8:14 AM Pager: [phone]

## 2018-11-08 NOTE — Progress Notes (Signed)
Patient sent to morgue with two cloth bags with paper calenders and personal care items.  1 ipad and 1 cell phone.

## 2018-11-08 NOTE — Telephone Encounter (Signed)
Received original D/C from [employer]-D/C forwarded to Dr. Isaiah Serge [facility]) for signature.

## 2018-11-08 NOTE — Progress Notes (Signed)
   10/31/2018 0957  Clinical Encounter Type  Visited With Patient  Visit Type Initial;Spiritual support  Referral From Nurse  Consult/Referral To Chaplain  Spiritual Encounters  Spiritual Needs Prayer  This chaplain responded to family's request for spiritual care before withdrawal of care.  The chaplain read the Pt. Chart and introduced herself to the Pt. RN-Nicole.  The chaplain read sacred scripture and prayed with the Pt.  The chaplain phoned the Pt. sister-Carrie with an update of the chaplain's pastoral presence.  The chaplain informed the Pt. sister medical updates will be from the RN.  The chaplain is available for F/U spiritual care as needed.

## 2018-11-08 NOTE — Progress Notes (Signed)
Pt was disconnected from the ventilator per MD extubation order.

## 2018-11-08 NOTE — Discharge Summary (Signed)
Physician Death Summary  Patient ID: Tiamarie Zabinski MRN: [id number] DOB/AGE: Oct 14, 1950 68 y.o.  Admit date: 10/22/2018 Discharge date: 11/13/2018  Admission Diagnoses: Septic shock, hyperkalemia  Discharge Diagnoses:  Multiorgan failure Septic shock Acute kidney injury Abdominal wall necrosis, necrotizing fasciitis  Discharged Condition: Deceased  Hospital Course:  68 year old morbidly obese female with a past medical history significant for CHF, COPD, OHS, chronic respiratory failure with a cuffless trach in place presents with right lower quadrant wound and septic shock with complete AV block requiring external placing and Dopamine, found to be hyperkalemic and in Acute renal failure secondary to ATN requiring emergent CRRT. PCCM consulted for admission.   Patient was externally paced, trach changed to a cuffed and ventilated. Cardiology consulted who felt she was not a candidate for transvenous pacing.  Nephrology was consulted and she was started on CRRT.  On night of admission she went into cardiac arrest and was resuscitated  She continued to be critically ill requiring 5 pressors which were maxed out.  Her abdominal wound continues to progress to necrotizing fasciitis. Surgery was on board. Multiple discussions were held with the family.  She had a very poor prognosis for overall survival even with surgery and family requested transition to comfort care  Consults: Surgery, nephrology, cardiology  Signed: Rosabell Geyer November 13, 2018, 3:22 PM

## 2018-11-08 NOTE — Progress Notes (Signed)
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  With mixed congestive heart failure.  She had acute kidney injury and a right-sided abdominal wound, this is been evaluated by surgery.  She had severe hyperkalemia.  Initiated on CRRT treatment 11-14-2018.  She had labile blood pressures.  It appears that the abdominal wound is been present since 07/2018 when it was visualized as a partially visualized large right lateral abdominal wall subcutaneous complex fluid collection during 18 cm.  Chest x-ray 2018-11-14 this morning reveals vascular congestion and pulmonary edema although stable  Blood pressure 89/49 pulse 95 temperature 97.8.  Sodium 126 potassium 5.5 chloride 195 CO2 15 glucose 150 BUN 69 creatinine 2.5 calcium 7.8 phosphorus 5.0 magnesium 2.2 albumin 1.0  IV infusions angiotensin II                    Dopamine                     Epinephrine                     Vasopressin                     Norepinephrine  IV antibiotics                      Clindamycin                       Flagyl  No urine output 4 L removed CRRT.  Objective:  Vital signs in last 24 hours:  Temp:  [97.7 F (36.5 C)-98.2 F (36.8 C)] 97.8 F (36.6 C) (05/18 0830) Pulse Rate:  [30-95] 95 (05/18 0915) Resp:  [19-37] 20 (05/18 0915) SpO2:  [85 %-100 %] 85 % (05/18 0915) Arterial Line BP: (71-149)/(44-112) 89/49 (05/18 0915) FiO2 (%):  [50 %-65 %] 50 % (05/18 0836) Weight:  [180.5 kg] 180.5 kg (05/18 0230)  Weight change: -2.753 kg Filed Weights   11/07/2018 2009 2018-11-14 0230  Weight: (!) 183.3 kg (!) 180.5 kg    Intake/Output: I/O last 3 completed shifts: In: 8232.1 [I.V.:6011.8; IV Piggyback:2220.3] Out: 7341 [Emesis/NG output:100; Other:3130]   Intake/Output this shift:  Total I/O In: 534.2 [I.V.:534.2] Out: 570 [Other:570]   obese CVS-tachycardic externally  paced RS-trach mechanical breath sounds nectar thick ABD-obese right-sided erythematous skin with deep and erythema lateral fold to be new dead skin. EXT- no edema   Basic Metabolic Panel: Recent Labs  Lab 10/15/2018 2224  11/04/2018 2312  10/24/18 0652 10/24/18 1000 10/24/18 1032 10/24/18 1532 10/24/18 2003 Nov 14, 2018 0403 Nov 14, 2018 0428  NA 126*   < > 123*   < > 126* 126* 128* 125* 126* 126*  126* 126*  K >7.5*   < > 7.2*   < > 6.8* 6.9* 6.1* 6.2* 6.1* 5.6*  5.5* 5.3*  CL 92*   < > 96*  --  90* 92*  --  94*  --  95*  95*  --   CO2 24  --   --   --  20* 18*  --  17*  --  14*  15*  --   GLUCOSE 77   < > 131*  --  185* 222*  --  178*  --  150*  155*  --   BUN 121*   < >  126*  --  113* 110*  --  94*  --  69*  67*  --   CREATININE 3.99*   < > 4.60*  --  3.86* 3.63*  --  3.13*  --  2.66*  2.51*  --   CALCIUM 8.8*  --   --   --  8.8* 8.6*  --  8.1*  --  7.7*  7.8*  --   MG  --   --   --   --  2.4  --   --   --   --  2.2  --   PHOS  --   --   --   --  5.3*  --   --  4.8*  --  5.0*  5.0*  --    < > = values in this interval not displayed.    Liver Function Tests: Recent Labs  Lab 10/10/2018 2224 10/24/18 0652 10/24/18 1532 Nov 07, 2018 0403  AST 22  --   --   --   ALT 19  --   --   --   ALKPHOS 298*  --   --   --   BILITOT 0.7  --   --   --   PROT 6.9  --   --   --   ALBUMIN 1.3* 1.2* 1.2* 1.0*   No results for input(s): LIPASE, AMYLASE in the last 168 hours. No results for input(s): AMMONIA in the last 168 hours.  CBC: Recent Labs  Lab 10/26/2018 2224  10/24/18 0603 10/24/18 1032 10/24/18 2003 11/07/2018 0403 Nov 07, 2018 0428  WBC 19.4*  --   --   --   --  29.7*  --   NEUTROABS 15.3*  --   --   --   --   --   --   HGB 10.1*   < > 13.6 11.9* 11.6* 10.2* 11.6*  HCT 34.0*   < > 40.0 35.0* 34.0* 34.9* 34.0*  MCV 85.9  --   --   --   --  85.5  --   PLT 337  --   --   --   --  201  --    < > = values in this interval not displayed.    Cardiac Enzymes: No results for  input(s): CKTOTAL, CKMB, CKMBINDEX, TROPONINI in the last 168 hours.  BNP: Invalid input(s): POCBNP  CBG: Recent Labs  Lab 10/24/2018 2237 10/18/2018 2251 10/24/18 0218 10/24/18 0232 10/24/18 0630  GLUCAP 79 152* 86* 135* 157*    Microbiology: Results for orders placed or performed during the hospital encounter of 10/22/2018  Blood Culture (routine x 2)     Status: Abnormal   Collection Time: 11/01/2018 10:24 PM  Result Value Ref Range Status   Specimen Description BLOOD LEFT ANTECUBITAL  Final   Special Requests   Final    BOTTLES DRAWN AEROBIC AND ANAEROBIC Blood Culture results may not be optimal due to an inadequate volume of blood received in culture bottles   Culture  Setup Time   Final    ANAEROBIC BOTTLE ONLY GRAM POSITIVE COCCI CRITICAL RESULT CALLED TO, READ BACK BY AND VERIFIED WITH: VERONA BRYK @ 7628 ON 10/24/18 BY ROBINSON Z.     Culture (A)  Final    STAPHYLOCOCCUS SPECIES (COAGULASE NEGATIVE) THE SIGNIFICANCE OF ISOLATING THIS ORGANISM FROM A SINGLE SET OF BLOOD CULTURES WHEN MULTIPLE SETS ARE DRAWN IS UNCERTAIN. PLEASE NOTIFY THE MICROBIOLOGY DEPARTMENT WITHIN ONE WEEK IF SPECIATION AND SENSITIVITIES ARE REQUIRED.  Performed at [facility], [place] [address] [place], [place] [address]    Report Status Nov 24, 2018 FINAL  Final  Blood Culture ID Panel (Reflexed)     Status: Abnormal   Collection Time: 10/14/2018 10:24 PM  Result Value Ref Range Status   Enterococcus species NOT DETECTED NOT DETECTED Final   Listeria monocytogenes NOT DETECTED NOT DETECTED Final   Staphylococcus species DETECTED (A) NOT DETECTED Final    Comment: Methicillin (oxacillin) resistant coagulase negative staphylococcus. Possible blood culture contaminant (unless isolated from more than one blood culture draw or clinical case suggests pathogenicity). No antibiotic treatment is indicated for blood  culture contaminants. CRITICAL RESULT CALLED TO, READ BACK BY AND VERIFIED WITH: VERONA BRYK @  2353 ON 10/24/18 BY ROBINSON Z.     Staphylococcus aureus (BCID) NOT DETECTED NOT DETECTED Final   Methicillin resistance DETECTED (A) NOT DETECTED Final    Comment: CRITICAL RESULT CALLED TO, READ BACK BY AND VERIFIED WITH: VERONA BRYK @ 6144 ON 10/24/18 BY ROBINSON Z.     Streptococcus species NOT DETECTED NOT DETECTED Final   Streptococcus agalactiae NOT DETECTED NOT DETECTED Final   Streptococcus pneumoniae NOT DETECTED NOT DETECTED Final   Streptococcus pyogenes NOT DETECTED NOT DETECTED Final   Acinetobacter baumannii NOT DETECTED NOT DETECTED Final   Enterobacteriaceae species NOT DETECTED NOT DETECTED Final   Enterobacter cloacae complex NOT DETECTED NOT DETECTED Final   Escherichia coli NOT DETECTED NOT DETECTED Final   Klebsiella oxytoca NOT DETECTED NOT DETECTED Final   Klebsiella pneumoniae NOT DETECTED NOT DETECTED Final   Proteus species NOT DETECTED NOT DETECTED Final   Serratia marcescens NOT DETECTED NOT DETECTED Final   Haemophilus influenzae NOT DETECTED NOT DETECTED Final   Neisseria meningitidis NOT DETECTED NOT DETECTED Final   Pseudomonas aeruginosa NOT DETECTED NOT DETECTED Final   Candida albicans NOT DETECTED NOT DETECTED Final   Candida glabrata NOT DETECTED NOT DETECTED Final   Candida krusei NOT DETECTED NOT DETECTED Final   Candida parapsilosis NOT DETECTED NOT DETECTED Final   Candida tropicalis NOT DETECTED NOT DETECTED Final    Comment: Performed at [facility], [place] [address] [place], [place] [address]  SARS Coronavirus 2 (CEPHEID - Performed in [place] hospital lab), Hosp Order     Status: None   Collection Time: 11/04/2018 10:59 PM  Result Value Ref Range Status   SARS Coronavirus 2 NEGATIVE NEGATIVE Final    Comment: (NOTE) If result is NEGATIVE SARS-CoV-2 target nucleic acids are NOT DETECTED. The SARS-CoV-2 RNA is generally detectable in upper and lower  respiratory specimens during the acute phase of infection. The lowest   concentration of SARS-CoV-2 viral copies this assay can detect is 250  copies / mL. A negative result does not preclude SARS-CoV-2 infection  and should not be used as the sole basis for treatment or other  patient management decisions.  A negative result may occur with  improper specimen collection / handling, submission of specimen other  than nasopharyngeal swab, presence of viral mutation(s) within the  areas targeted by this assay, and inadequate number of viral copies  (<250 copies / mL). A negative result must be combined with clinical  observations, patient history, and epidemiological information. If result is POSITIVE SARS-CoV-2 target nucleic acids are DETECTED. The SARS-CoV-2 RNA is generally detectable in upper and lower  respiratory specimens dur ing the acute phase of infection.  Positive  results are indicative of active infection with SARS-CoV-2.  Clinical  correlation with patient history and other diagnostic information is  necessary to determine patient infection status.  Positive results do  not rule out bacterial infection or co-infection with other viruses. If result is PRESUMPTIVE POSTIVE SARS-CoV-2 nucleic acids MAY BE PRESENT.   A presumptive positive result was obtained on the submitted specimen  and confirmed on repeat testing.  While 2019 novel coronavirus  (SARS-CoV-2) nucleic acids may be present in the submitted sample  additional confirmatory testing may be necessary for epidemiological  and / or clinical management purposes  to differentiate between  SARS-CoV-2 and other Sarbecovirus currently known to infect humans.  If clinically indicated additional testing with an alternate test  methodology [id number]) is advised. The SARS-CoV-2 RNA is generally  detectable in upper and lower respiratory sp ecimens during the acute  phase of infection. The expected result is Negative. Fact Sheet for Patients:  StrictlyIdeas.no Fact Sheet  for Healthcare Providers: BankingDealers.co.za This test is not yet approved or cleared by the [place] FDA and has been authorized for detection and/or diagnosis of SARS-CoV-2 by FDA under an Emergency Use Authorization (EUA).  This EUA will remain in effect (meaning this test can be used) for the duration of the COVID-19 declaration under Section 564(b)(1) of the Act, 21 U.S.C. section 360bbb-3(b)(1), unless the authorization is terminated or revoked sooner. Performed at [facility], [place] [address], [place], [place] [address]   Blood Culture (routine x 2)     Status: None (Preliminary result)   Collection Time: 10/31/2018 11:04 PM  Result Value Ref Range Status   Specimen Description BLOOD  Final   Special Requests   Final    RUQ BOTTLES DRAWN AEROBIC ONLY Blood Culture adequate volume Performed at [place] [address], [place], [place] [address]    Culture PENDING  Incomplete   Report Status PENDING  Incomplete  MRSA PCR Screening     Status: Abnormal   Collection Time: 10/24/18  8:47 AM  Result Value Ref Range Status   MRSA by PCR POSITIVE (A) NEGATIVE Final    Comment:        The GeneXpert MRSA Assay (FDA approved for NASAL specimens only), is one component of a comprehensive MRSA colonization surveillance program. It is not intended to diagnose MRSA infection nor to guide or monitor treatment for MRSA infections. RESULT CALLED TO, READ BACK BY AND VERIFIED WITH: S.COOK RN AT 7588 10/24/2018 BY A.DAVIS Performed at [facility], [place] [address], [place], [place] [address]     Coagulation Studies: Recent Labs    10/24/18 1000  LABPROT 16.8*  INR 1.4*    Urinalysis: No results for input(s): COLORURINE, LABSPEC, PHURINE, GLUCOSEU, HGBUR, BILIRUBINUR, KETONESUR, PROTEINUR, UROBILINOGEN, NITRITE, LEUKOCYTESUR in the last 72 hours.  Invalid input(s): APPERANCEUR    Imaging: Dg Chest 1 View  Result Date:  10/24/2018 CLINICAL DATA:  Central line placement EXAM: CHEST  1 VIEW COMPARISON:  10/24/2018 FINDINGS: The ET tube tip is above the carina. There is a right IJ catheter with tip in the projection of the cavoatrial junction. No pneumothorax after catheter placement. The left IJ catheter is stable in position with tip also projecting over the cavoatrial junction. ET tube tip is above the carina. Cardiac enlargement. Mild diffuse pulmonary edema. IMPRESSION: 1. Interval placement of right IJ catheter with tip projecting over the cavoatrial junction. No pneumothorax. 2. Cardiac enlargement and pulmonary edema. Electronically Signed   By: Kerby Moors M.D.   On:  10/24/2018 17:42   Dg Abd 1 View  Result Date: 10/24/2018 CLINICAL DATA:  Evaluate OG tube EXAM: ABDOMEN - 1 VIEW COMPARISON:  None. FINDINGS: The lower chest was not included on today's study. However, the side port of the NG tube is below the GE junction in the distal tip is in the gastric fundus. IMPRESSION: The NG tube terminates in left upper quadrant, within the fundus of the stomach. Electronically Signed   By: Dorise Bullion III M.D   On: 10/24/2018 12:43   Dg Chest Port 1 View  Result Date: 10/31/2018 CLINICAL DATA:  Acute respiratory failure EXAM: PORTABLE CHEST 1 VIEW COMPARISON:  10/24/2018 FINDINGS: Cardiac shadow is stable. Tracheostomy tube, gastric catheter and bilateral jugular catheters are again noted and stable. Mild vascular congestion and mild pulmonary edema is seen similar to that noted on the prior exam. No focal confluent infiltrate is seen. No bony abnormality is noted. IMPRESSION: Stable vascular congestion and pulmonary edema. Tubes and lines stable in appearance. Electronically Signed   By: Inez Catalina M.D.   On: 10-31-18 08:04   Dg Chest Portable 1 View  Result Date: 10/24/2018 CLINICAL DATA:  Central line placement EXAM: PORTABLE CHEST 1 VIEW COMPARISON:  11/05/2018 FINDINGS: Left IJ approach central venous  catheter tip in the proximal right atrium. Examination otherwise unchanged. IMPRESSION: L IJ CVC tip in the proximal right atrium. Electronically Signed   By: Ulyses Jarred M.D.   On: 10/24/2018 01:04   Dg Chest Port 1 View  Result Date: 10/09/2018 CLINICAL DATA:  Sepsis EXAM: PORTABLE CHEST 1 VIEW COMPARISON:  08/21/2018 FINDINGS: Tracheostomy tube tip projects over the upper trachea. Mild cardiomegaly. No focal airspace consolidation or pulmonary edema. IMPRESSION: No active disease. Electronically Signed   By: Ulyses Jarred M.D.   On: 11/06/2018 22:30     Medications:   . sodium chloride 10 mL/hr at 10-31-2018 0900  . sodium chloride 10 mL/hr at 10/24/18 1748  . angiotensin II (GIAPREZA) infusion 40 ng/kg/min (Oct 31, 2018 0900)  . DOPamine 14 mcg/kg/min (2018-10-31 0900)  . epinephrine 20 mcg/min (Oct 31, 2018 0900)  . fentaNYL infusion INTRAVENOUS    . heparin 10,000 units/ 20 mL infusion syringe 750 Units/hr (10/31/2018 0516)  . midazolam 2 mg/hr (2018-10-31 0900)  . norepinephrine (LEVOPHED) Adult infusion 8 mcg/min (10/31/2018 0900)  . prismasol BGK 0/2.5 1,500 mL/hr at 2018/10/31 0329  . prismasol BGK 2/2.5 replacement solution 400 mL/hr at 10/24/18 2358  . prismasol BGK 2/2.5 replacement solution 400 mL/hr at 10/24/18 2359  . vasopressin (PITRESSIN) infusion - *FOR SHOCK* 0.03 Units/min (10-31-18 0900)   . chlorhexidine gluconate (MEDLINE KIT)  15 mL Mouth Rinse BID  . Chlorhexidine Gluconate Cloth  6 each Topical Daily  . mouth rinse  15 mL Mouth Rinse 10 times per day  . sodium bicarbonate  50 mEq Intravenous Once  . sodium chloride flush  10-40 mL Intracatheter Q12H  . sodium zirconium cyclosilicate  10 g Oral Once   Place/Maintain arterial line **AND** sodium chloride, fentaNYL, fentaNYL (SUBLIMAZE) injection, glycopyrrolate **OR** glycopyrrolate **OR** glycopyrrolate, heparin, heparin, midazolam, polyvinyl alcohol, sodium chloride flush  Assessment/ Plan:   Acute kidney injury in  setting of septic shock.  Decision made by family to withdraw CRRT.  This appears to been a very appropriate decision in view of the patient's overall comorbid state  Sepsis right-sided abdominal wound with cellulitis unstable patient unable to undergo surgical intervention  Hyperkalemia appears to have resolved with CRRT  Shock/hypotension continues on pressors.  Metabolic acidosis improving with CRRT and intravenous bicarbonate  Will sign off patient thank you    LOS: 1 Sherril Croon _0 _1 :08 AM

## 2018-11-08 NOTE — Progress Notes (Signed)
PHARMACY - PHYSICIAN COMMUNICATION CRITICAL VALUE ALERT - BLOOD CULTURE IDENTIFICATION (BCID)  Rachael Robertson is an 68 y.o. female who presented to [facility] on 10/17/2018 with a chief complaint of worsening wound.  Assessment:  68yo female admitted w/ multiple necrotic wounds causing severe sepsis, now on broad-spectrum ABX; blood cx reveals MRSA growing in 1/4 bottles.  Name of physician (or Provider) Contacted: Dr Deterding (CC[facility])  Current antibiotics: vancomycin, cefepime, metronidazole, clindamycin  Changes to prescribed antibiotics recommended:  Patient is on recommended antibiotics - No changes needed due to severe sepsis.  Results for orders placed or performed during the hospital encounter of 10/20/2018  Blood Culture ID Panel (Reflexed) (Collected: 10/22/2018 10:24 PM)  Result Value Ref Range   Enterococcus species NOT DETECTED NOT DETECTED   Listeria monocytogenes NOT DETECTED NOT DETECTED   Staphylococcus species DETECTED (A) NOT DETECTED   Staphylococcus aureus (BCID) NOT DETECTED NOT DETECTED   Methicillin resistance DETECTED (A) NOT DETECTED   Streptococcus species NOT DETECTED NOT DETECTED   Streptococcus agalactiae NOT DETECTED NOT DETECTED   Streptococcus pneumoniae NOT DETECTED NOT DETECTED   Streptococcus pyogenes NOT DETECTED NOT DETECTED   Acinetobacter baumannii NOT DETECTED NOT DETECTED   Enterobacteriaceae species NOT DETECTED NOT DETECTED   Enterobacter cloacae complex NOT DETECTED NOT DETECTED   Escherichia coli NOT DETECTED NOT DETECTED   Klebsiella oxytoca NOT DETECTED NOT DETECTED   Klebsiella pneumoniae NOT DETECTED NOT DETECTED   Proteus species NOT DETECTED NOT DETECTED   Serratia marcescens NOT DETECTED NOT DETECTED   Haemophilus influenzae NOT DETECTED NOT DETECTED   Neisseria meningitidis NOT DETECTED NOT DETECTED   Pseudomonas aeruginosa NOT DETECTED NOT DETECTED   Candida albicans NOT DETECTED NOT DETECTED   Candida glabrata NOT DETECTED NOT  DETECTED   Candida krusei NOT DETECTED NOT DETECTED   Candida parapsilosis NOT DETECTED NOT DETECTED   Candida tropicalis NOT DETECTED NOT DETECTED    Vernard Gambles, PharmD, BCPS  11-13-18  12:07 AM

## 2018-11-08 NOTE — Progress Notes (Signed)
Nutrition Brief Note  RD drawn to pt due to vent status. Chart reviewed. Pt now transitioning to comfort care.  No nutrition interventions warranted at this time.  Please consult as needed.   Earma Reading, MS, RD, LDN Inpatient Clinical Dietitian Pager: [phone] Weekend/After Hours: [phone]

## 2018-11-08 NOTE — Progress Notes (Signed)
Progress Note   Subjective   AV block has resolved, no longer transcutaneously pacing.   Surgery is considering options for her   Inpatient Medications    Scheduled Meds: . chlorhexidine gluconate (MEDLINE KIT)  15 mL Mouth Rinse BID  . Chlorhexidine Gluconate Cloth  6 each Topical Daily  . mouth rinse  15 mL Mouth Rinse 10 times per day  . sodium bicarbonate  50 mEq Intravenous Once  . sodium chloride flush  10-40 mL Intracatheter Q12H  . sodium zirconium cyclosilicate  10 g Oral Once   Continuous Infusions: . sodium chloride 10 mL/hr at 11/20/2018 0900  . sodium chloride 10 mL/hr at 10/24/18 1748  . angiotensin II (GIAPREZA) infusion 40 ng/kg/min (20-Nov-2018 0900)  . DOPamine 14 mcg/kg/min (November 20, 2018 0900)  . epinephrine 20 mcg/min (20-Nov-2018 0900)  . fentaNYL infusion INTRAVENOUS    . heparin 10,000 units/ 20 mL infusion syringe 750 Units/hr (11/20/18 0516)  . midazolam 2 mg/hr (20-Nov-2018 0900)  . norepinephrine (LEVOPHED) Adult infusion 8 mcg/min (20-Nov-2018 0900)  . prismasol BGK 0/2.5 1,500 mL/hr at November 20, 2018 0329  . prismasol BGK 2/2.5 replacement solution 400 mL/hr at 10/24/18 2358  . prismasol BGK 2/2.5 replacement solution 400 mL/hr at 10/24/18 2359  . vasopressin (PITRESSIN) infusion - *FOR SHOCK* 0.03 Units/min (20-Nov-2018 0900)   PRN Meds: Place/Maintain arterial line **AND** sodium chloride, fentaNYL, fentaNYL (SUBLIMAZE) injection, glycopyrrolate **OR** glycopyrrolate **OR** glycopyrrolate, heparin, heparin, midazolam, polyvinyl alcohol, sodium chloride flush   Vital Signs    Vitals:   2018/11/20 0836 2018-11-20 0845 11-20-2018 0900 11-20-2018 0915  BP:      Pulse: 91   95  Resp: (!) 30 (!) 24 (!) 30 20  Temp:      TempSrc:      SpO2: 98%   (!) 85%  Weight:      Height:        Intake/Output Summary (Last 24 hours) at 2018-11-20 0949 Last data filed at Nov 20, 2018 0900 Gross per 24 hour  Intake 7188.72 ml  Output 3800 ml  Net 3388.72 ml   Filed Weights   10/19/2018  2009 11-20-2018 0230  Weight: (!) 183.3 kg (!) 180.5 kg    Telemetry    Sinus with frequent ectopy - Personally Reviewed  Physical Exam   GEN- The patient is illappearing, morbidly obese, sedated Head- normocephalic, atraumatic Eyes-  Sclera clear, conjunctiva pink Ears- hearing intact Oropharynx- clear Neck- tach in place Lungs- not labored Heart- Regular rate and rhythm  GI- soft, NT, ND, + BS Extremities- no clubbing, cyanosis, + diffuse edema edema  MS-diffuse atrophy Skin- multiple necrotic skin lesions Psych- unable to assess   Labs    Chemistry Recent Labs  Lab 11/04/2018 2224  10/24/18 0652 10/24/18 1000  10/24/18 1532 10/24/18 2003 11-20-18 0403 11-20-2018 0428  NA 126*   < > 126* 126*   < > 125* 126* 126*  126* 126*  K >7.5*   < > 6.8* 6.9*   < > 6.2* 6.1* 5.6*  5.5* 5.3*  CL 92*   < > 90* 92*  --  94*  --  95*  95*  --   CO2 24  --  20* 18*  --  17*  --  14*  15*  --   GLUCOSE 77   < > 185* 222*  --  178*  --  150*  155*  --   BUN 121*   < > 113* 110*  --  94*  --  69*  67*  --   CREATININE 3.99*   < > 3.86* 3.63*  --  3.13*  --  2.66*  2.51*  --   CALCIUM 8.8*  --  8.8* 8.6*  --  8.1*  --  7.7*  7.8*  --   PROT 6.9  --   --   --   --   --   --   --   --   ALBUMIN 1.3*  --  1.2*  --   --  1.2*  --  1.0*  --   AST 22  --   --   --   --   --   --   --   --   ALT 19  --   --   --   --   --   --   --   --   ALKPHOS 298*  --   --   --   --   --   --   --   --   BILITOT 0.7  --   --   --   --   --   --   --   --   GFRNONAA 11*  --  12* 12*  --  15*  --  18*  19*  --   GFRAA 13*  --  13* 14*  --  17*  --  21*  23*  --   ANIONGAP 10  --  16* 16*  --  14  --  17*  16*  --    < > = values in this interval not displayed.     Hematology Recent Labs  Lab 10/13/2018 2224  10/24/18 2003 10-31-18 0403 10/31/2018 0428  WBC 19.4*  --   --  29.7*  --   RBC 3.96  --   --  4.08  --   HGB 10.1*   < > 11.6* 10.2* 11.6*  HCT 34.0*   < > 34.0* 34.9* 34.0*  MCV  85.9  --   --  85.5  --   MCH 25.5*  --   --  25.0*  --   MCHC 29.7*  --   --  29.2*  --   RDW 18.5*  --   --  18.9*  --   PLT 337  --   --  201  --    < > = values in this interval not displayed.    Cardiac EnzymesNo results for input(s): TROPONINI in the last 168 hours. No results for input(s): TROPIPOC in the last 168 hours.      Assessment & Plan    1.  Complete heart block Occurred in the setting of profound metabolic derrangement Appears to be improved with correction of hyperkalemia Continue to correct metabolic acidosis/ electrolyte abnormalities  Unfortunately, she is not a candidate for EP procedures given ongoing active infection, skin lesions, acute renal failure and morbid obesity.  Hopefully, her conduction will remain stable. She is now DNR.  I would advise that we avoid external pacing in the future if at all possible.  Palliative options would seem most appropriate.  Dr Vaughan Browner is having ongoing conversations with family.     Thompson Grayer MD, [facility] 10-31-18 9:49 AM

## 2018-11-08 NOTE — Progress Notes (Addendum)
..   NAME:  Rachael Robertson, MRN:  [id number], DOB:  [birth year], LOS: 1 ADMISSION DATE:  10-27-2018, CONSULTATION DATE:  10/27/2018 REFERRING MD:  Dr. Dalene Seltzer CHIEF COMPLAINT:  Acute Heart Block and pain in RLQ wound     Brief History   68 year old morbidly obese female with a past medical history significant for CHF, COPD, OHS, chronic respiratory failure with a cuffless trach in place presents with right lower quadrant wound and septic shock with complete AV block requiring external placing and Dopamine, found to be hyperkalemic and in Acute renal failure secondary to ATN requiring emergent CRRT. PCCM cons for admission  Past Medical History  .Marland Kitchen Active Ambulatory Problems    Diagnosis Date Noted   Chronic respiratory failure with hypoxia (HCC) 05/07/2018   Hemoptysis    OSA (obstructive sleep apnea)    Tracheostomy status (HCC)    Hypoxemia    Lethargy    Acute on chronic systolic heart failure (HCC) 05/20/2018   Arthritis 05/20/2018   Other chronic pain 05/20/2018   Right Lower Lobe PNU  05/28/2018   Tracheostomy complication (HCC)    Subcutaneous hematoma 07/26/2018   Acute blood loss anemia 07/26/2018   Hematuria 07/26/2018   Lymphedema of both lower extremities 08/10/2018   Leg wound, right 08/10/2018   Acute on chronic respiratory failure with hypoxia (HCC) 08/18/2018   Acute pulmonary edema (HCC)    Morbid obesity (HCC)    Hypothyroidism    Chronic gout without tophus    Hx of pulmonary embolus    Hematoma of leg, right, initial encounter    Oxygen dependent    Abdominal wall hematoma    Right lower quadrant abdominal mass 10/21/2018   Acute renal failure (HCC)    Bradycardia, severe sinus    Hyperkalemia    Septic shock (HCC)    Encounter for continuous renal replacement therapy (CRRT) for acute renal failure [facility])    Resolved Ambulatory Problems    Diagnosis Date Noted   No Resolved Ambulatory Problems   Past Medical History:    Diagnosis Date   Asthma    CHF (congestive heart failure) (HCC)    COPD (chronic obstructive pulmonary disease) (HCC)    Obesity    Opiate use    Renal disorder    Tracheostomy in place [facility])     Significant Hospital Events   5/17- Externally paced, Hyperkalemia >7. Started on CRRT, trach changed to cuffed and ventilated. Coded for 8 mins  Consults:  10/27/2018>>>>Surgery 10-27-18>>>>Nephrology 10-27-2018>>>>>Cardiology 10/13/2018>>>>>>PCCM Procedures:  10/24/2018>>>>Left IJ CVC for RRT  Significant Diagnostic Tests:  Chest x-ray 5/17-HD catheter in appropriate position, trach in place.  Cardiomegaly.  SARS-CoV-2 negative  EKG: Complete AV block with wide QRS complex Right bundle branch block>>>>>>> heart rate in the 20s.  QTC 427  Micro Data:  2018-10-27 SARSCOV2 >> negative Oct 27, 2018  BCx2 >> MRSA    Antimicrobials:  Oct 27, 2018 Vancomycin >> Oct 27, 2018 Cefepime >> 10-27-2018 Flagyl  >> 10/24/2018 Clindamycin >>  Interim history/subjective:  On 5 pressors- weaning dopamine at 16 mcg/kg/min, Epi at 20 mcg/min, Levophed at 8 mcg/min, vasopressin, and Giapreza 40 ng/kg/min CRRT running overnight without issues K 5.6 Remains acidotic, but improved WBC up to 29K Has been TCP overnight- held currently, patient with underlying intrinsic rate - afib rate in the 90's Sedated on versed /hr, fentanyl 110mcg/hr  Objective   Blood pressure (!) 98/44, pulse 79, temperature 98.2 F (36.8 C), temperature source Oral, resp. rate (!) 30, height  (1.6 m), weight Marland Kitchen)  180.5 kg, SpO2 (!) 88 %.    Vent Mode: PRVC FiO2 (%):  [50 %-65 %] 50 % Set Rate:  [30 bmp] 30 bmp Vt Set:  [420 mL] 420 mL PEEP:  [5 cmH20] 5 cmH20 Plateau Pressure:  [24 cmH20-25 cmH20] 24 cmH20   Intake/Output Summary (Last 24 hours) at 11/05/2018 0827 Last data filed at 10/30/2018 0800 Gross per 24 hour  Intake 7075.07 ml  Output 3500 ml  Net 3575.07 ml   Filed Weights   Nov 22, 2018 2009 10/09/2018  0230  Weight: (!) 183.3 kg (!) 180.5 kg    Examination: General:  Morbidly obese, critically ill female sedated on MV HEENT: MM pink/moist/ R pupils 3/nonreactive, L 2/nonreactive, midline trach cuffed, Left NGT  Neuro: sedated from being paced, had been following commands overnight CV: not paced- afib in 90's,  PULM: even/non-labored, synchronous, clear anterior breath sounds GI: large dressing to right abd with foul odor, scant BS    Extremities: cool, mottled up LE's up to mid abd and hands, dressings to LLE and RLE Skin: mottled, some burn areas from anterior pacing pads  Assessment & Plan:  68 year old with severe septic shock, renal failure with hyperkalemia, complete heart block in the setting of right lower necrotic abdominal wound  Chronic Respiratory Failure Multifactorial: h/o Asthma, COPD, OHS, and OSA Cardiogenic and Septic shock Necrotizing soft tissue infection of R abdominal wall and legs MRSA bacteremia  Acute renal failure secondary to possible ATN Unstable Bradycardia- resolved Complete heart block with escape rhythm in the 20s resolved- felt associated with acidosis/ hyperkalemia from sepsis P:  Patient was able to be taken off transcutaneous pacing this am with underlying intrinsic rthym of third degree switching to atrial fib.   Seen by EP- she is not a candidate for temporary pacing wire or permanent pacemaker if needed.  CCS then discussed with patient's sister, Rachael Robertson, to determine if surgery, with very high mortality risk was something they wished to pursue and it was decided against.  I then spoke with Rachael Robertson who emphasized that quality of life was very important to Rachael Robertson and her one wish would never to go a SNF.  Given her critical illness and extent of wounds, she would likely require a SNF if she even survived this illness.  At this time, we will transition to comfort care per families wishes.  She will be full DNR.  Rachael Robertson ask that a chaplin pray with  her sister.  No family is currently in town to be present with her.  I offered video conference but they politely opted out.  We will notify them at the time of her passing. Will return blood on CRRT and stop.  Will continue with fentanyl and versed drips for comfort, with prn robinul and once comfortable, stop vasopressor support, and all other medications not essential to comfort, and remove from mechanical ventilation.  I suspect patient will pass quickly.    Best practice:  Diet: NPO Pain/Anxiety/Delirium protocol (if indicated):  VAP protocol (if indicated): yes DVT prophylaxis: d/c GI prophylaxis: d/c Glucose control:  Mobility: Bedrest bariatric bed Code Status: DNR Family Communication: as above, spoke with Rachael Robertson, sister at length Disposition: ICU Prognosis:   Labs   CBC: Recent Labs  Lab 22-Nov-2018 2224  10/24/18 0603 10/24/18 1032 10/24/18 2003 10/08/2018 0403 10/23/2018 0428  WBC 19.4*  --   --   --   --  29.7*  --   NEUTROABS 15.3*  --   --   --   --   --   --  HGB 10.1*   < > 13.6 11.9* 11.6* 10.2* 11.6*  HCT 34.0*   < > 40.0 35.0* 34.0* 34.9* 34.0*  MCV 85.9  --   --   --   --  85.5  --   PLT 337  --   --   --   --  201  --    < > = values in this interval not displayed.    Basic Metabolic Panel: Recent Labs  Lab 11/02/2018 2224  10/28/2018 2312  10/24/18 0652 10/24/18 1000 10/24/18 1032 10/24/18 1532 10/24/18 2003 12-Nov-2018 0403 12-Nov-2018 0428  NA 126*   < > 123*   < > 126* 126* 128* 125* 126* 126*   126* 126*  K >7.5*   < > 7.2*   < > 6.8* 6.9* 6.1* 6.2* 6.1* 5.6*   5.5* 5.3*  CL 92*   < > 96*  --  90* 92*  --  94*  --  95*   95*  --   CO2 24  --   --   --  20* 18*  --  17*  --  14*   15*  --   GLUCOSE 77   < > 131*  --  185* 222*  --  178*  --  150*   155*  --   BUN 121*   < > 126*  --  113* 110*  --  94*  --  69*   67*  --   CREATININE 3.99*   < > 4.60*  --  3.86* 3.63*  --  3.13*  --  2.66*   2.51*  --   CALCIUM 8.8*  --   --   --  8.8* 8.6*  --  8.1*   --  7.7*   7.8*  --   MG  --   --   --   --  2.4  --   --   --   --  2.2  --   PHOS  --   --   --   --  5.3*  --   --  4.8*  --  5.0*   5.0*  --    < > = values in this interval not displayed.   GFR: Estimated Creatinine Clearance: 36.5 mL/min (A) (by C-G formula based on SCr of 2.51 mg/dL (H)). Recent Labs  Lab 10/30/2018 2224 10/11/2018 2259 10/24/18 0652 11-12-18 0403  WBC 19.4*  --   --  29.7*  LATICACIDVEN  --  1.7 3.6*  --     Liver Function Tests: Recent Labs  Lab 10/09/2018 2224 10/24/18 0652 10/24/18 1532 11/12/2018 0403  AST 22  --   --   --   ALT 19  --   --   --   ALKPHOS 298*  --   --   --   BILITOT 0.7  --   --   --   PROT 6.9  --   --   --   ALBUMIN 1.3* 1.2* 1.2* 1.0*   No results for input(s): LIPASE, AMYLASE in the last 168 hours. No results for input(s): AMMONIA in the last 168 hours.  ABG    Component Value Date/Time   PHART 7.267 (L) November 12, 2018 0428   PCO2ART 36.0 Nov 12, 2018 0428   PO2ART 156.0 (H) 2018/11/12 0428   HCO3 16.5 (L) 2018-11-12 0428   TCO2 18 (L) 11-12-2018 0428   ACIDBASEDEF 10.0 (H) 11/12/2018 0428   O2SAT 99.0 11-12-18 0428  Coagulation Profile: Recent Labs  Lab 10/24/18 1000  INR 1.4*    Cardiac Enzymes: No results for input(s): CKTOTAL, CKMB, CKMBINDEX, TROPONINI in the last 168 hours.  HbA1C: No results found for: HGBA1C  CBG: Recent Labs  Lab 10/27/2018 2237 10/26/2018 2251 10/24/18 0218 10/24/18 0232 10/24/18 0630  GLUCAP 79 152* 55* 135* 157*   CCT 70 mins   Posey BoyerBrooke Alexsis Kathman, MSN, AGACNP-BC [facility] Pgr: [phone] or if no answer [phone] 2018/10/20, 8:27 AM

## 2018-11-08 DEATH — deceased

## 2018-11-12 ENCOUNTER — Encounter: Payer: 59 | Admitting: Internal Medicine

## 2020-08-22 IMAGING — DX DG CHEST 1V PORT
1 series · 1 of 1 positions shown · non-contrast
Comparison: None.

CLINICAL DATA: Shortness of breath.

EXAM:
PORTABLE CHEST 1 VIEW

[chest ap]
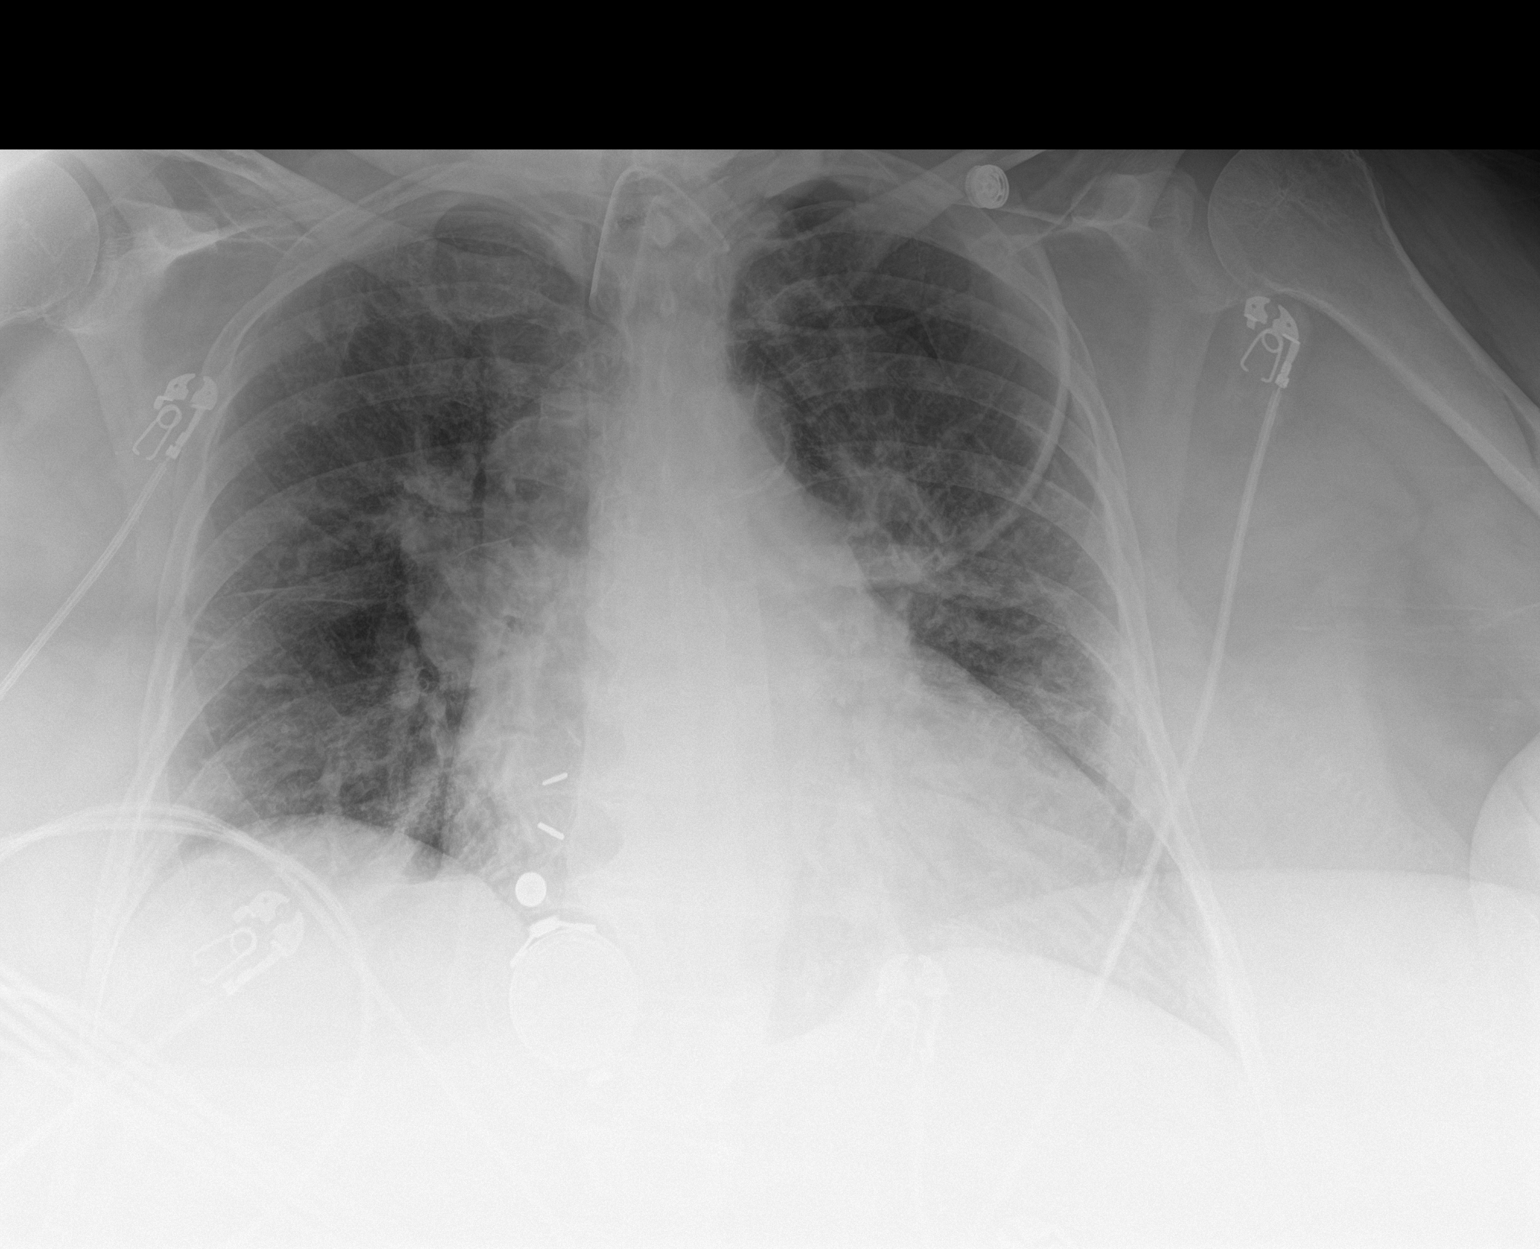

[1 of 1 positions shown; findings below may reference images not displayed]

FINDINGS: Tracheostomy tube at the thoracic inlet. The heart is enlarged.
Aortic atherosclerosis. Scattered atelectasis without confluent
airspace disease. No pleural effusion or pneumothorax. No pulmonary
edema. Rounded metallic density projecting over the medial right
hemidiaphragm is presumably external to the patient.
IMPRESSION: 1. Tracheostomy tube at the thoracic inlet.
2. Cardiomegaly with scattered atelectasis.

## 2020-12-03 IMAGING — DX PORTABLE CHEST - 1 VIEW
1 series · 1 of 1 positions shown · non-contrast
Comparison: 07/19/2018

CLINICAL DATA: Shortness of breath

EXAM:
PORTABLE CHEST 1 VIEW

[chest]
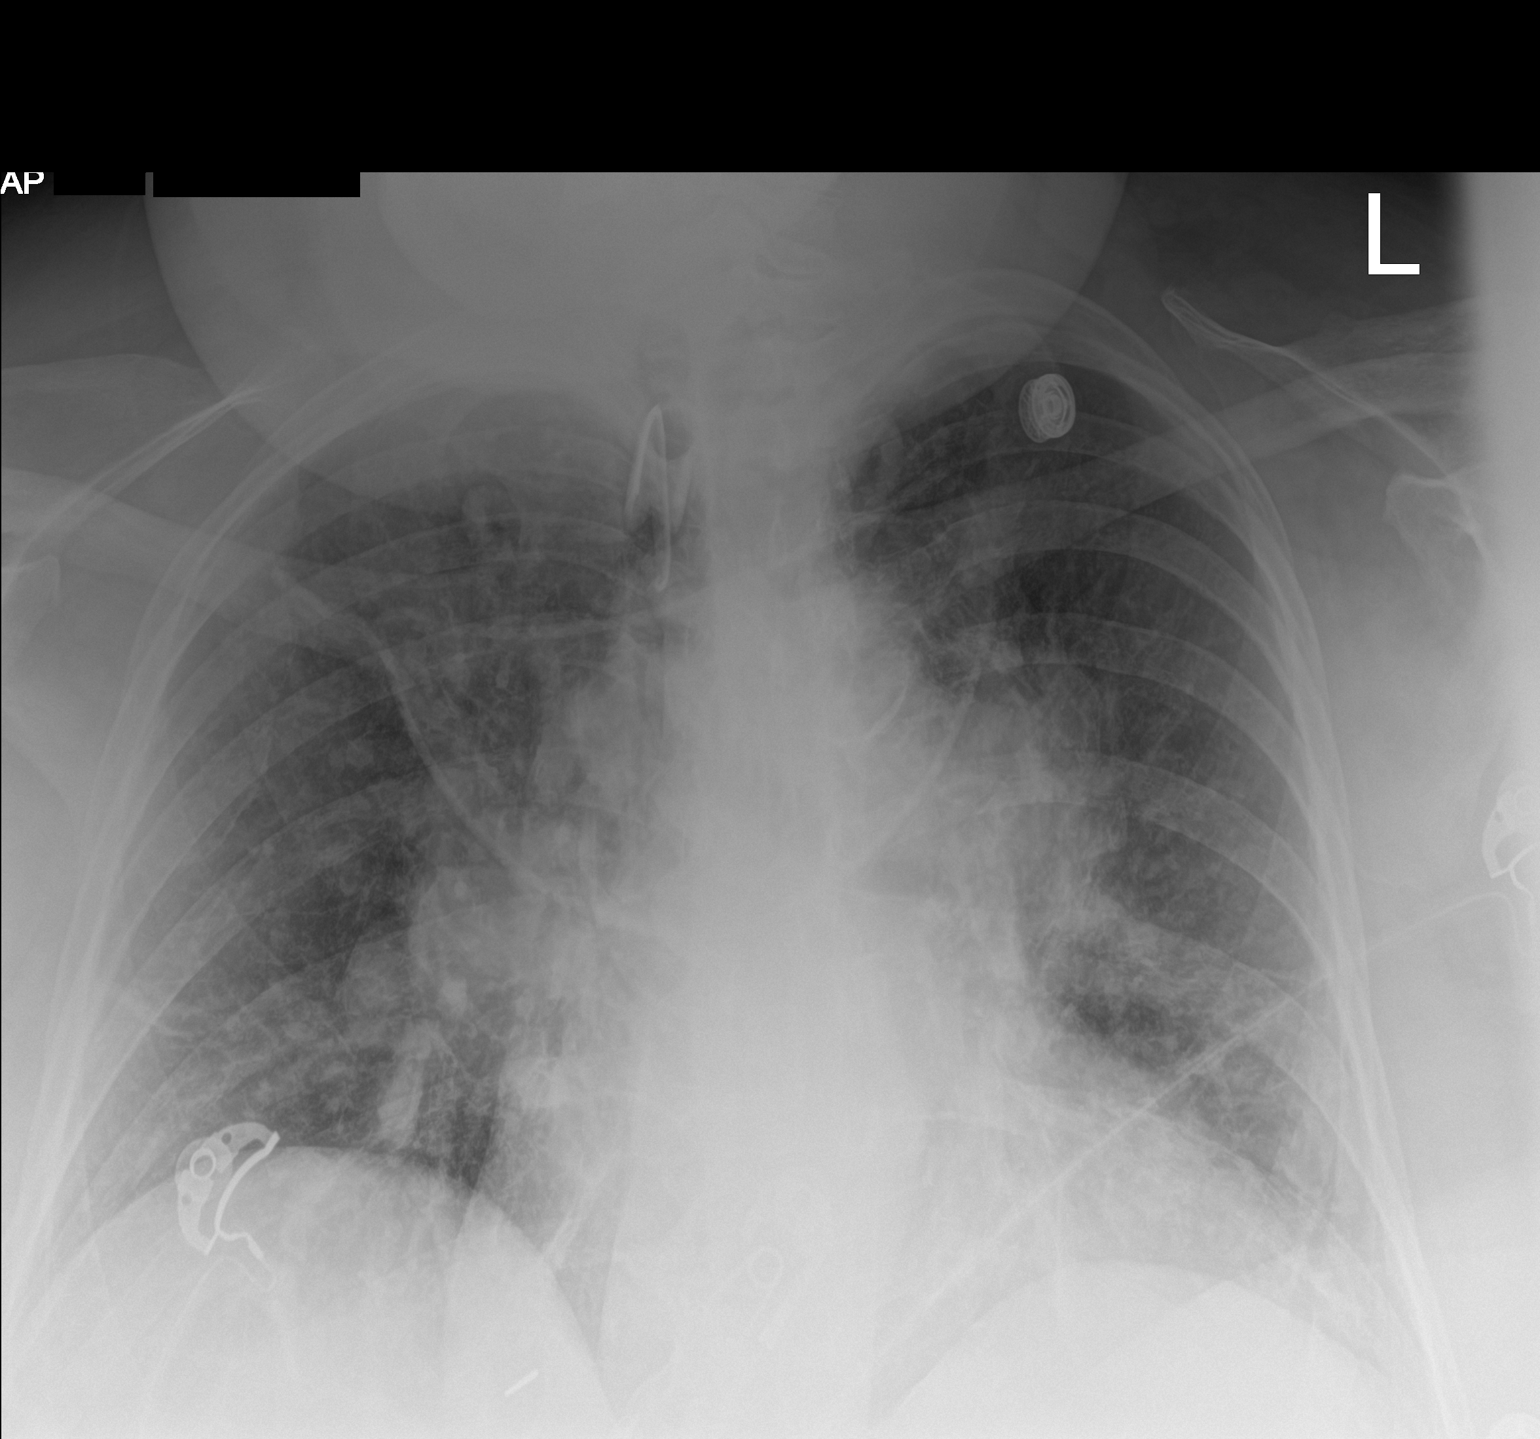

[1 of 1 positions shown; findings below may reference images not displayed]

FINDINGS: Tracheostomy tube is in place. Cardiomegaly with vascular congestion
and hazy perihilar edema. Aortic atherosclerosis. No pneumothorax.
IMPRESSION: Cardiomegaly with vascular congestion and mild perihilar edema.
Enlarged appearing central pulmonary vessels suggesting arterial
hypertension.

## 2021-02-09 IMAGING — DX PORTABLE CHEST - 1 VIEW
1 series · 1 of 1 positions shown · non-contrast
Comparison: 10/24/2018

CLINICAL DATA: Acute respiratory failure

EXAM:
PORTABLE CHEST 1 VIEW

[chest ap]
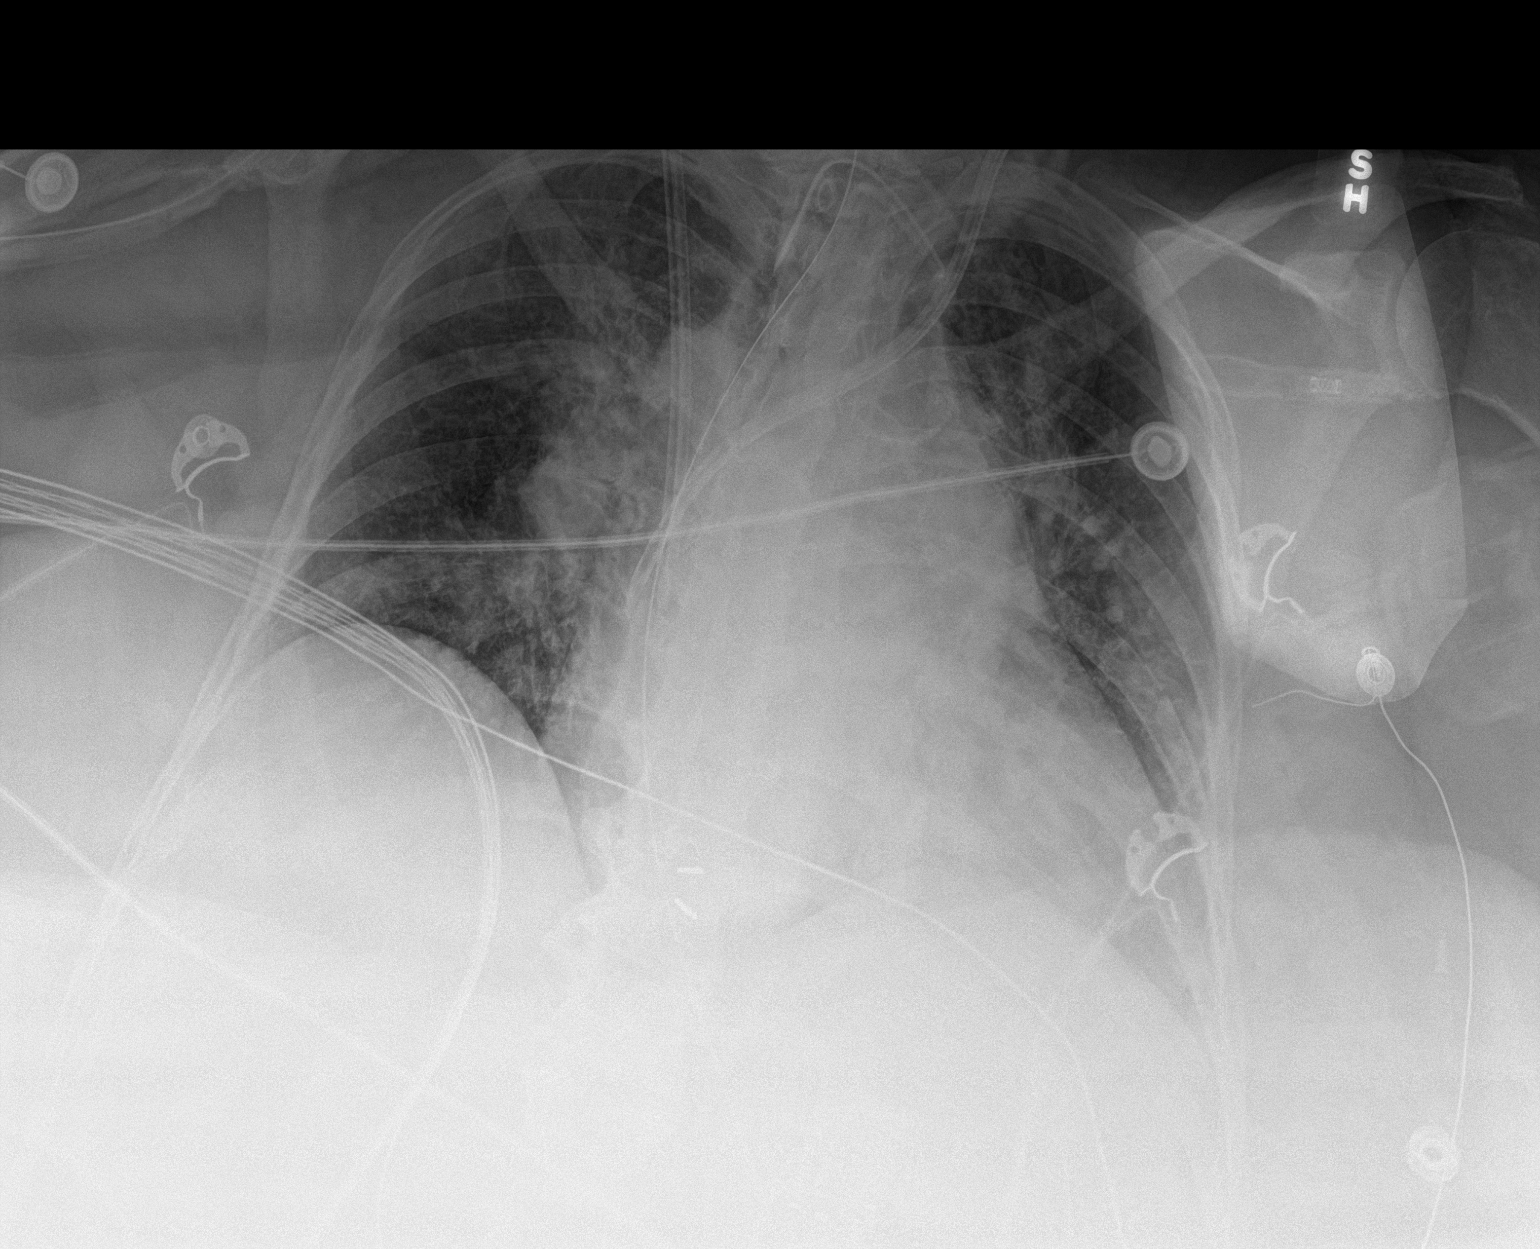

[1 of 1 positions shown; findings below may reference images not displayed]

FINDINGS: Cardiac shadow is stable. Tracheostomy tube, gastric catheter and
bilateral jugular catheters are again noted and stable. Mild
vascular congestion and mild pulmonary edema is seen similar to that
noted on the prior exam. No focal confluent infiltrate is seen. No
bony abnormality is noted.
IMPRESSION: Stable vascular congestion and pulmonary edema.

Tubes and lines stable in appearance.
